# Patient Record
Sex: Male | Born: 1940 | Race: White | Hispanic: No | Marital: Married | Smoking: Former smoker
Health system: Southern US, Community
[De-identification: ages and names within clinical notes are randomized; demographics above are authoritative.]

## PROBLEM LIST (undated history)

## (undated) DIAGNOSIS — D696 Thrombocytopenia, unspecified: Secondary | ICD-10-CM

## (undated) DIAGNOSIS — I209 Angina pectoris, unspecified: Secondary | ICD-10-CM

## (undated) DIAGNOSIS — I639 Cerebral infarction, unspecified: Secondary | ICD-10-CM

## (undated) DIAGNOSIS — I482 Chronic atrial fibrillation, unspecified: Secondary | ICD-10-CM

## (undated) DIAGNOSIS — I10 Essential (primary) hypertension: Secondary | ICD-10-CM

## (undated) DIAGNOSIS — E119 Type 2 diabetes mellitus without complications: Secondary | ICD-10-CM

## (undated) DIAGNOSIS — E785 Hyperlipidemia, unspecified: Secondary | ICD-10-CM

## (undated) DIAGNOSIS — N529 Male erectile dysfunction, unspecified: Secondary | ICD-10-CM

## (undated) DIAGNOSIS — I499 Cardiac arrhythmia, unspecified: Secondary | ICD-10-CM

## (undated) DIAGNOSIS — Z87442 Personal history of urinary calculi: Secondary | ICD-10-CM

## (undated) DIAGNOSIS — I252 Old myocardial infarction: Secondary | ICD-10-CM

## (undated) DIAGNOSIS — M1990 Unspecified osteoarthritis, unspecified site: Secondary | ICD-10-CM

## (undated) DIAGNOSIS — N400 Enlarged prostate without lower urinary tract symptoms: Secondary | ICD-10-CM

## (undated) DIAGNOSIS — I2510 Atherosclerotic heart disease of native coronary artery without angina pectoris: Secondary | ICD-10-CM

## (undated) HISTORY — DX: Hyperlipidemia, unspecified: E78.5

## (undated) HISTORY — DX: Thrombocytopenia, unspecified: D69.6

## (undated) HISTORY — DX: Chronic atrial fibrillation, unspecified: I48.20

## (undated) HISTORY — DX: Atherosclerotic heart disease of native coronary artery without angina pectoris: I25.10

## (undated) HISTORY — DX: Male erectile dysfunction, unspecified: N52.9

## (undated) HISTORY — DX: Essential (primary) hypertension: I10

## (undated) HISTORY — DX: Cardiac arrhythmia, unspecified: I49.9

## (undated) HISTORY — DX: Type 2 diabetes mellitus without complications: E11.9

## (undated) HISTORY — DX: Old myocardial infarction: I25.2

## (undated) HISTORY — PX: APPENDECTOMY: SHX54

## (undated) HISTORY — DX: Benign prostatic hyperplasia without lower urinary tract symptoms: N40.0

---

## 1996-02-10 HISTORY — PX: HERNIA REPAIR: SHX51

## 1999-06-05 ENCOUNTER — Inpatient Hospital Stay (HOSPITAL_COMMUNITY): Admission: EM | Admit: 1999-06-05 | Discharge: 1999-06-06 | Payer: Self-pay | Admitting: Emergency Medicine

## 1999-06-05 ENCOUNTER — Encounter: Payer: Self-pay | Admitting: Emergency Medicine

## 1999-06-06 ENCOUNTER — Encounter: Payer: Self-pay | Admitting: Interventional Cardiology

## 2003-03-27 ENCOUNTER — Inpatient Hospital Stay (HOSPITAL_COMMUNITY): Admission: EM | Admit: 2003-03-27 | Discharge: 2003-03-30 | Payer: Self-pay | Admitting: Emergency Medicine

## 2003-12-27 ENCOUNTER — Ambulatory Visit: Payer: Self-pay | Admitting: Internal Medicine

## 2004-03-13 ENCOUNTER — Ambulatory Visit (HOSPITAL_COMMUNITY): Admission: RE | Admit: 2004-03-13 | Discharge: 2004-03-13 | Payer: Self-pay | Admitting: Internal Medicine

## 2004-03-27 ENCOUNTER — Ambulatory Visit: Payer: Self-pay | Admitting: Cardiology

## 2004-03-27 ENCOUNTER — Ambulatory Visit: Payer: Self-pay

## 2004-04-03 ENCOUNTER — Encounter: Admission: RE | Admit: 2004-04-03 | Discharge: 2004-04-03 | Payer: Self-pay | Admitting: Internal Medicine

## 2004-07-04 ENCOUNTER — Ambulatory Visit: Payer: Self-pay | Admitting: Cardiology

## 2004-07-10 ENCOUNTER — Ambulatory Visit: Payer: Self-pay | Admitting: Cardiology

## 2007-07-12 ENCOUNTER — Encounter (INDEPENDENT_AMBULATORY_CARE_PROVIDER_SITE_OTHER): Payer: Self-pay | Admitting: Gastroenterology

## 2007-07-12 ENCOUNTER — Ambulatory Visit (HOSPITAL_COMMUNITY): Admission: RE | Admit: 2007-07-12 | Discharge: 2007-07-12 | Payer: Self-pay | Admitting: Gastroenterology

## 2007-12-06 ENCOUNTER — Ambulatory Visit (HOSPITAL_COMMUNITY): Admission: RE | Admit: 2007-12-06 | Discharge: 2007-12-06 | Payer: Self-pay | Admitting: Interventional Cardiology

## 2010-02-09 DIAGNOSIS — Z87442 Personal history of urinary calculi: Secondary | ICD-10-CM

## 2010-02-09 HISTORY — DX: Personal history of urinary calculi: Z87.442

## 2010-06-24 NOTE — Cardiovascular Report (Signed)
Eric Gallagher, ZEIMET NO.:  [id number]   MEDICAL RECORD NO.:  [id number]          PATIENT TYPE:  OIB   LOCATION:  2899                         FACILITY:  [facility]   PHYSICIAN:  Lyn Records, M.D.   DATE OF BIRTH:  [birth year]   DATE OF PROCEDURE:  12/06/2007  DATE OF DISCHARGE:                            CARDIAC CATHETERIZATION   INDICATIONS:  1. History of coronary artery disease.  2. Exertional chest tightness.  3. The patient has chronic atrial fibrillation with rate control.   PROCEDURE PERFORMED:  1. Left heart cath.  2. Selective coronary angio.  3. Left ventriculography.   DESCRIPTION:  After informed consent, a 6-French sheath was inserted  into the right femoral artery using the modified Seldinger technique.  A  6-French A2 multipurpose catheter was then used for hemodynamic  recordings and left ventriculography by hand injection.  Because of  aortic anatomy, we were unable to selectively engage the coronaries with  multipurpose catheter.  We therefore switched out to preformed Judkins-  style catheters.  A 6-French #4 left and a 6-French #4 right Judkins  catheter were used for left and right coronary angiography respectively.  The patient tolerated the procedure without complications.   RESULTS:  1. Hemodynamic data:      a.     Aortic pressure 122/73.      b.     Left ventricular pressure 130/9.  2. Left ventriculography:  No mitral regurgitation is noted.  Overall      LV function is normal.  EF is 50%.  3. Coronary angiography:      a.     Left main coronary:  Normal.      b.     Left anterior descending coronary:  The LAD contains       eccentric proximal narrowing.  The stent in the proximal vessel       was widely patent.  The narrowing is proximal to the stent.      c.     Circumflex artery:  Circumflex artery gives origin to a       large obtuse marginal and contains irregularities with proximal       30% eccentric narrowing.  The  vessel however is widely patent.      d.     Right coronary artery:  The right coronary artery is a large       dominant vessel giving two left ventricular branches in the PDA.       The vessel contains no significant obstruction.  There is ectasia       in the mid and proximal vessel.  There is ostial 30%-50% RCA       narrowing.  No catheter damping is noted.  Significant reflux of       contrast into the aortic root is noted.   CONCLUSION:  1. Mild to moderate coronary artery disease with 30% proximal LAD      eccentric stenosis, 40-50% ostial RCA stenosis, and 40% proximal      circumflex.  2. Widely patent proximal LAD stent.  3.  Normal LV function.  4. Exertional chest tightness etiology uncertain.  Consider myocardial      perfusion study, but with the wide patency of the coronary      arteries, I do not believe this is indicated at this point.      Consider making adjustments in the patient's medical regimen.      Lyn Records, M.D.  Electronically Signed     HWS/MEDQ  D:  12/06/2007  T:  12/06/2007  Job:  [id number]   cc:   Theressa Millard, M.D.  Danise Edge, M.D.  Georgann Housekeeper, MD

## 2010-06-24 NOTE — Op Note (Signed)
NAME:  Eric Gallagher, MARGRAF.:  [id number]   MEDICAL RECORD NO.:  [id number]          PATIENT TYPE:  AMB   LOCATION:  ENDO                         FACILITY:  [facility]   PHYSICIAN:  Danise Edge, M.D.   DATE OF BIRTH:  [birth year]   DATE OF PROCEDURE:  07/12/2007  DATE OF DISCHARGE:                               OPERATIVE REPORT   REFERRING PHYSICIAN:  Georgann Housekeeper, MD   PROCEDURE INDICATIONS:  Eric Gallagher is a 70 year old male born  1940-09-08.  Eric Gallagher is scheduled to undergo his first  screening colonoscopy with polypectomy to prevent colon cancer.   Eric Gallagher has chronic atrial fibrillation, but is not on Coumadin.   ENDOSCOPIST:  Danise Edge, MD   PREMEDICATION:  1. Fentanyl 50 mcg.  2. Versed 4 mg.   PROCEDURE:  After obtaining informed consent, Eric Gallagher was placed in  the left lateral decubitus position.  I administered intravenous  fentanyl and intravenous Versed to achieve conscious sedation for the  procedure.  The patient's blood pressure, oxygen saturation, and cardiac  rhythm were monitored throughout the procedure and documented in the  medical record.   Anal inspection and digital rectal exam were normal.  The prostate was  nonnodular.  The Pentax pediatric colonoscope was introduced into the  rectum and easily advanced to the cecum.  A normal-appearing appendiceal  orifice and ileocecal valve were identified.  Colonic preparation for  the exam today was good.   Rectum:  From the distal rectum a 3-mm sessile polyp was removed with  the cold snare.  The specimen was not retrieved for pathological  evaluation.  Sigmoid colon and descending colon:  A few small diverticula are noted  in the sigmoid colon.  Splenic flexure normal.  Transverse colon normal.  Hepatic flexure normal.  Ascending colon:  From the distal ascending colon, a 3-mm sessile polyp  was removed with the cold snare and submitted for pathological  interpretation.  Cecum and ileocecal valve normal.   ASSESSMENT:  1. A small polyp was removed from the distal ascending colon and      submitted for pathological interpretation.  2. A small polyp was removed from the distal rectum.  Pathology was      not sent.  3. Sigmoid colonic diverticulosis.   RECOMMENDATIONS:  Repeat colonoscopy in 5 years.           ______________________________  Danise Edge, M.D.     MJ/MEDQ  D:  07/12/2007  T:  07/12/2007  Job:  [id number]   cc:   Georgann Housekeeper, MD

## 2010-06-27 NOTE — Cardiovascular Report (Signed)
[place]  Patient:    Eric Gallagher, Eric Gallagher                       MRN: [id number] Proc. Date: 06/05/99 Adm. Date:  [id number] Disc. Date: [id number] Attending:  Lyn Records. Iii                        Cardiac Catheterization  REASON FOR PROCEDURE:  Unrelenting chest discomfort consistent with ischemia but with normal ECG.  CINE NUMBER:  [id number]  PROCEDURES PERFORMED: 1. Left heart catheterization. 2. Selective coronary angiography. 3. Left ventriculography. 4. Right arterial sheath angiogram.  DESCRIPTION OF PROCEDURE:  After informed consent, a 6 French sheath was inserted into the right femoral artery using a modified Seldinger technique. A 6 French A2 multipurpose catheter was used for hemodynamic recordings, left ventriculography and selective right coronary angiography.  A #4 left Judkins catheter was used for left coronary angiography.  A sheath ______ was performed in the right femoral.  Perclose was not performed because of a branch entry site.  No complications occurred during the procedure.  During the procedure, the patient states that he is having no discomfort.  RESULTS:  I:  LEFT VENTRICULOGRAPHY:  The left ventricle demonstrates normal contractility.  Ejection fraction is 65-70%.  No mitral regurgitation is noted.  II:  SELECTIVE CORONARY ANGIOGRAPHY:    a. Right coronary:  The right coronary artery is a large dominant       vessel giving origin to two large left ventricular branches and a       large PDA.  There was an ostial 30-50% lesion.  Irregularity is       noted in the mid vessel.  No significant right coronary disease is       noted.    b. Left main coronary:  The left main coronary contains a proximal       kink that obstructs the artery by up 25%.  No significant obstruction       was noted.    c. Left anterior descending coronary:  The artery wraps around the       left ventricular apex.  It gives origin to two  small diagonal branches.       There is an eccentric 50% proximal stenosis.  No high-grade obstruction       is noted in the LAD.    d. Circumflex artery:  The circumflex artery is large, terminating in one       dominant obtuse marginal that trifurcates on the left lateral wall.  CONCLUSIONS: 1. No explanation for the patients chest discomfort based upon coronary    anatomy.  There is mild right coronary, LAD, and left main disease. 2. Normal left ventricular function. 3. Etiology of chest discomfort is uncertain, but we must exclude    pericarditis, aortic dissection, gallbladder disease, and pulmonary    parenchymal infectious process.  PLAN:  We will review chest x-rays.  Consider chest CT scan.  Consider gallbladder ultrasound.  A 2-D echocardiogram will be done to evaluate pericardium and aorta. DD:  06/06/99 TD:  06/06/99 Job: [id number]

## 2010-06-27 NOTE — Discharge Summary (Signed)
NAME:  Eric Gallagher, Eric Gallagher NO.:  [id number]   MEDICAL RECORD NO.:  [id number]                   PATIENT TYPE:  INP   LOCATION:  4702                                 FACILITY:  [facility]   PHYSICIAN:  Lyn Records, M.D.                DATE OF BIRTH:  March 03, 1940   DATE OF ADMISSION:  03/27/2003  DATE OF DISCHARGE:  03/30/2003                                 DISCHARGE SUMMARY   ADMISSION DIAGNOSES:  1. Chest pain, probable non-Q-wave myocardial infarction.  2. Hypertension.  3. Non-insulin-dependent diabetic.  4. Unknown lipid status.   DISCHARGE DIAGNOSES:  1. Coronary artery disease.     A. Status post non-Q-wave anterior myocardial infarction.     B. Status post stent to the left anterior descending artery, March 27, 2003.  2. Hypertension, controlled.  3. Hyperlipidemia.  4. Non-insulin-dependent diabetes.   PROCEDURES:  Left heart catheterization, March 27, 2003.   COMPLICATIONS:  None.   DISCHARGE STATUS:  Stable, improved.   HPI:  Please see complete H&P for details, but, in short, this is a 70-year-  old male who was brought to the emergency room on March 27, 2003, with  complaints of midsternal chest tightness and shortness of breath,  diaphoresis, and feelings of presyncope.  This began after he moved an  approximately 50 pound aluminum door.  He came in and sat down, had 10/10  discomfort, and tried Tums and Rolaids without relief.  EMS was called.  He  was given one sublingual nitroglycerin en route, with some decrease in his  discomfort.  In the emergency room, he was given an additional  nitroglycerin, again with some relief in his pain.  However, he still  continued to complain of a 2/10 chest pain.   PHYSICAL EXAM ON ADMISSION:  VITAL SIGNS:  Blood pressure 142/95, heart rate  67, respirations 20, temp 98.8, O2 sats 98% on 2 L.  GENERAL:  Conscious, alert, well-oriented.  SKIN:  Warm and dry.  NECK:  No JVD,  thyromegaly or cervical lymphadenopathy.  No subclavian or  carotid bruit.  LUNGS:  Clear to auscultation bilaterally.  HEART:  Regular rate and rhythm.  ABDOMEN:  Obese, but soft and nontender.  Normal bowel sounds.  No  hepatosplenomegaly.  EXTREMITIES:  No peripheral edema.  Pulses +2 bilaterally in all  extremities.  NEUROLOGIC:  No deficits.   ADMISSION LABS:  Chest x-ray showed no acute process.  EKG showed a normal  sinus rhythm without ischemic changes.  Normal CBC with the exception of  thrombocytopenia at 75.  CMP was normal with the exception of hyperglycemia  at 183.  Initial cardiac markers were elevated, with myoglobin of 271, CK-MB  5.8, troponin 0.20, rising to 0.62.   HOSPITAL COURSE:  The patient was admitted for further cardiac evaluation,  serial cardiac enzymes.  He was started  on IV heparin and IV nitroglycerin,  and aspirin p.o.  He was also started on low-dose beta blocker for cardiac  protection as well as hypertension.  Glucophage was placed on hold for  probable cath.  Sliding scale coverage was added.  A fasting lipid panel was  planned to be checked.   An hour or so later, he was taken to the cardiac cath lab by Dr. Corliss Marcus in the absence of Dr. Katrinka Blazing.  Left heart cath was performed without  incident.  Results showed normal left main and left circumflex.  RCA had an  area of 40% mid, with a thrombus present.  The LAD had a proximal 95%  lesion, which was successfully stented with a Taxus stent.   Serial cardiac enzymes peaked with a total CK at 321, with 55.3 MB and a  troponin of 4.88.  Hemoglobin A1c was also obtained, which revealed 9.8.   Essentially, for the rest of his hospital admission, his vital signs  remained stable, and he had no recurrent chest discomfort or shortness of  breath.  He continued to exhibit low platelet count.  On March 28, 2003,  it was 76,000.  His Aggrastat was discontinued at this point.  Cardiac rehab  was  initiated.  Of note, the patient is now also a participant in the [facility]  protocol with [employer] research, with either Plavix or prasugrel, which is  randomized.   He was up and ambulating without difficulty with cardiac rehab.  He was  essentially walking independently.  No further complaints of chest pain.  Lipid profile returned showing a total of 213, triglycerides 271, HDL 36,  LDL 123.  He was started on Lipitor at this point.   On March 30, 2003, the patient was feeling well.  Vital signs were  stable.  He was discharged home without further incident.   DISCHARGE MEDICATIONS:  1. Lipitor 20 mg daily.  2. Lopressor 50 mg one half tablet daily.  3. Glucophage 1000 mg b.i.d.  4. Aspirin 325 mg daily.  5. Nitroglycerin 0.4 mg p.r.n.   He was instructed not to perform any strenuous activity until he sees Dr.  Katrinka Blazing for follow-up.  He is to maintain a low-salt, low-fat, low-cholesterol  diet, as well as maintain his diabetic diet restrictions.   He will be contacted by a research nurse from [facility] for a  follow-up appointment.  He has an appointment to see Dr. Katrinka Blazing for follow-up  on Monday, April 16, 2003, at 3:15 p.m.  He is to come at 2:15 p.m. that same  day for a repeat CBC to re-evaluate thrombocytopenia, and he may eventually  need hematology consult.  Of note, the patient does not have a primary care  physician, and he has been instructed to obtain one as soon as possible.     Adrian Saran, N.P.                        Lyn Records, M.D.   HB/MEDQ  D:  03/30/2003  T:  03/31/2003  Job:  [id number]

## 2010-06-27 NOTE — Discharge Summary (Signed)
[place]. [facility]  Patient:    Eric Gallagher, Eric Gallagher                       MRN: [id number] Adm. Date:  [id number] Disc. Date: [id number] Attending:  Lyn Records. Iii Dictator:   Anselm Lis, N.P.                           Discharge Summary  DATE OF BIRTH:  1940/10/28  PRIMARY CARE Cedarius Kersh:  Unestablished.  PROCEDURES: 1. (June 05, 1999):  Cardiac catheterization revealing mild right coronary    artery disease with 30 to 50% proximal right coronary artery, proximal left    main kink that obstructs the artery by up to 25%, 50% proximal left    anterior descending, and no significant circumflex disease.  Ejection    fraction 65 to 70% without MR, normal contractually.  Two-dimensional    echocardiogram (June 06, 1999) revealing borderline left ventricular    hypertrophy with normal left ventricular function.  Mildly dilated left    atrium; lipomatous hypertrophy of the intra-atrial septum noted.  Left    atrial dimension of 4.51 cm.  Normal right ventricle.  Trace mitral    regurgitation, trace tricuspid regurgitation. No pulmonary hypertension. 2. Chest CT:  No pulmonary emboli; no aortic dissection.  Mediastinal    adenopathy.  Abdominal CT:  Negative gallstones, bilaterally renal cysts.  DISCHARGE DIAGNOSES: 1. Chest pain of uncertain etiology.    a. Cardiac catheterization June 05, 1999, which is negative for       significant coronary artery disease.  Normal left ventricular       function.    b. Two-dimensional echocardiogram negative for pericardial effusion which       ruled out pericarditis.    c. Chest and abdominal CT were negative for aortic dissection nor for       obvious pulmonary etiology.  There was evidence of bilateral renal       cysts; urinalysis positive for Escherichia coli.  Initiate on Bactrim       for question of infected renal cyst.  No other clear etiology for chest       pain elicited. 2. Thrombocytopenia of  uncertain etiology; admission platelets of 95,000;    follow-up platelet count 81,000.  PLAN:  Patient discharged home in stable condition.  DISCHARGE MEDICATIONS:  Bactrim DS one tablet p.o. b.i.d.  ACTIVITY:  As tolerated.  DIET:  Regular.  FOLLOW-UP:  Dr. Verdis Prime; patient to call for next available appointment.  HOSPITAL COURSE:  (Please also see dictated H&P).  Eric Gallagher is a pleasant 70 year old male admitted with anterior chest pain and EKG abnormalities suspicious for "old" posterior infarct.  In the emergency room, he was treated with sublingual/IV nitrates, as well as baby aspirin and initiation of subcu Lovenox with improvement of discomfort though continued low grade.  Initial hypotension from sublingual nitrates with systolic pressure down to the 21H (from 160s) treated with IV fluids.  Patient was taken urgently to catheterization lab with the following results:  1. Left ventriculogram:  Normal. 2. Coronary angiography:    a. Left main:  Approximately 20%.    b. Left anterior descending:  30%.    c. Circumflex:  Normal.    d. Right coronary artery:  30 and 50% ostial.  Etiology of chest pain clearly not related to coronary artery disease.  He  subsequently underwent a 2-D echocardiogram which was negative for evidence of pericardial effusion reflective of pericarditis.  Abdominal and chest CT revealed negative aortic aneurysm nor obvious pulmonary disease.  He did have evidence of bilateral renal cysts.  His UA culture was positive for E. coli. Initial WBC of 17.3; 9.0 at the time of discharge.  He was initiated on Bactrim for treatment of possible infected renal cysts versus UTI versus prostatitis.  He is discharged home in stable condition.  LABORATORY TESTS AND DATA:  EKG revealed NSR with early R wave progression; question "old" posterior MI.  Urine culture was positive for E. coli greater than 100,000 colonies. Admission coagulations were within normal  range.  Admission chemistry profile reveals a sodium of 136, K 3.9, chloride 104, CO2 23, glucose elevated at 203, 134 at time of discharge.  BUN 15, creatinine 0.7, calcium 9.0.  LFTs within normal range though albumin decreased at 3.1.  First CK of 149, with MB fraction of 2.1 and troponin I of less than 0.03.  Admission WBC 17.6; 9.0 at time of discharge.  Hemoglobin 14.6 with hematocrit of 43.0.  Admission platelets 95, 81 at time of discharge.  CBC differential with left shift; neutrophils elevated at 92%.  Admission chest x-ray revealed low inspiratory lung volumes and mild bibasilar atelectasis, otherwise negative. DD:  08/26/99 TD:  08/26/99 Job: [id number]

## 2010-06-27 NOTE — H&P (Signed)
[place]. [facility]  Patient:    Eric Gallagher, Eric Gallagher                      MRN: [id number] Adm. Date:  06/05/99 Attending:  Darci Needle, M.D. Dictator:   Anselm Lis, N.P.                         History and Physical  PRECARDIAC CATHETERIZATION  SUBJECTIVE:  Eric Gallagher is a pleasant 70 year old without prior history of CAD, positive family history for CAD, a remote history of hypertension, remote tobacco use, who, approximately one year earlier developed exertional symptoms of anterior chest pressure with associated arm discomfort relieved with rest. The discomfort has been increasing in frequency and intensity over time. About a 1-1/2 weeks ago he had an episode while driving his car of associated presyncopal symptoms as well as palpitations.  The symptoms lasted approximately 10 minutes.  Yesterday afternoon about 1:30 he had onset of anterior chest pressure which has been consistent since.  The discomfort has increased in intensity and severity.  He has associated palpitations, shortness of breath, felt hot, and had bilateral arm discomfort.  He was taken to [facility] this morning; appeared gray by ER nurse assessment.  EKG revealed "old," posterior infarct with early R-wave progression.  He was given sublingual nitrites with decreased systolic blood pressure from 160s down to 80s.  He was placed on IV nitrites as well as given subcutaneous lovenox and four baby aspirin.  He has had improvement in his discomfort, though it does continue low-grade anterior pressure.  CARDIAC RISK FACTORS:  Age, male sex, positive family history of CAD, remote tobacco and remote hypertension.  PREVIOUS MEDICAL HISTORY: 1. Gastroesophageal reflux disease. 2. Remote hypertension. 3. History of appendectomy, left hernia repair with mesh, vasectomy. 4. Patient denies history of peptic ulcer disease, thyroid disease, asthma,    cancer.  He has unknown  cholesterol profile.  SOCIAL HISTORY AND HABITS:  Tobacco use:  Negative.  Quit smoking about 30 years earlier prior to this smoked 2-1/2 packs a day for 10 years. ETOH:  Negative.  Caffeine:  A few cups of coffee in the morning and two liters of soda a day.  Patients first wife died after marriage of about 40 years.  She died of complications related to cancer.  His second wife to which he is married about 2-1/2 years.  Patient has four children from first marriage; identical twins, sons age 61, an old son age 39, and a daughter age 7, all alive and well.  He works on [employer].  He is a new resident to [place], about the last two years prior to this lived about 200 miles [place].  He is not established with primary care in this area.  FAMILY HISTORY:  Father deceased age 76 of cancer, uncertain type; suffered prior abdominal aortic aneurysm as well as neck aneurysm repair.  His mother died of a stroke age 18, sudden in onset.  Had been healthy prior to this. The patient has five brothers and four sisters.  One sibling died of liver cirrhosis.  One brother died of myocardial infarction, age 75.  Another brother has had a heart attack, age early 17s.  REVIEW OF SYSTEMS:  As in HPI/PAST MEDICAL HISTORY: Otherwise complains of palpitations occurring approximately 2 times per week without associated symptoms.  Presyncopal episode as in  HPI, though no other episodes of this.  Denies dysphagia to fruit or fluid.  Does wear glasses.  Negative melena.  No overt or blood PR.  Negative constipation nor diarrhea.  He does have a long history of GERD symptoms felt as epigastric burning and sour taste in his mouth.  Good relief with p.r.n. TUMS.  Denies pedal edema, orthopnea, nor PND.  Denies dysuria nor hematuria; does have problems with urinary hesitancy.  PHYSICAL EXAMINATION:  VITAL SIGNS:  Blood pressure initial systolic of 160; down to 80s post sublingual  nitrites.  He was placed on IV fluids 125 mL/h.  Maintain pressure on IV nitrites but now blood pressure hovering around high 80s for which IV fluids were increased to 250 ml/h.  His heart rate is 90s and regular. Respiratory rate 20.  He is afebrile.  GENERAL:  In general, he is a well-nourished 70 year old male in no acute distress and accompanied by his wife and other.  HEENT:  Brisk bilateral carotid upstrokes without bruit.  NECK:  No significant JVD nor thyromegaly.  CHEST:  Bibasilar crackles.  CARDIAC:  Regular rate and rhythm without murmur, rub, nor gallop.  Normal S1 and S2.  ABDOMEN:  Soft, protrudent, normal active bowel sounds.  Negative abdominal aorta.  Revealed no femoral bruit.  Nontender to palpation.  EXTREMITIES:  Bilateral radial +2/4, femoral, dorsalis pedis, and posterior tibial pulses.  Negative pedal edema.  NEUROLOGIC:  Cranial nerves II-XII grossly intact; alert and oriented x 3.  GENITALIA/RECTAL:  Exam deferred.  LABORATORY TESTS AND DATA:  Sodium 136, potassium 3.9, chloride 104, CO2 23, BUN 15, creatinine 0.7, and glucose of 203.  Chemistry profile:  Hemoglobin 14.6, with WBC of 17.3, and platelets decreased at 95.  Pro time of 14.1 with INR of 1.2 and PTT of 28.  EKG reveals NSR with early R-wave progression; ? "old," posterior MI.  IMPRESSION: 1. Acute coronary syndrome; electrocardiogram changes suspicious of    possible recent posterior myocardial infarction.  No acute ST-segment    changes.  The first set of cardiac enzymes are negative.  Currently, he    has mild anterior chest discomfort which is improved status post    nitrites, subcutaneous Lovenox, and baby aspirin.  He can continue his IV    nitroglycerin drip. 2. Thrombocytopenia with platelets decreased at 95, of uncertain etiology. 3. Leukocytosis, probable stress response to acute coronary syndrome.  PLAN: 1. Urgently to cardiac catheterization laboratory for coronary  angiography    with possible percutaneous intervention if indicated and able.  Risks,     potential complications, benefits and alternatives of the procedure is    discussed with the patient.  Eric Gallagher indicates his questions and    concerns have been addressed and is agreeable for this to be received. 2. Will treat hypotension with bolus of IV fluids. 3. Will not give upstream IIb/IIIa inhibitors prior to catheterization    in the setting of thrombocytopenia. DD:  06/05/99 TD:  06/05/99 Job: [id number]

## 2010-06-27 NOTE — Cardiovascular Report (Signed)
NAME:  Eric Gallagher, Eric Gallagher                          ACCOUNT NO.:  [id number]   MEDICAL RECORD NO.:  [id number]                   PATIENT TYPE:  INP   LOCATION:  2115                                 FACILITY:  [facility]   PHYSICIAN:  Francisca December, M.D.               DATE OF BIRTH:  25-Sep-1940   DATE OF PROCEDURE:  03/27/2003  DATE OF DISCHARGE:                              CARDIAC CATHETERIZATION   PROCEDURES PERFORMED:  1. Left heart catheterization.  2. Coronary angiography.  3. Left ventriculogram.  4. Percutaneous coronary intervention/stent implantation proximal left     anterior descending.  5. Right femoral arteriogram.  6. Percutaneous closure (Angio-Seal), right femoral artery.  7. Intracoronary thrombectomy.   CARDIOLOGIST:  Francisca December, M.D.   INDICATIONS:  Eric Gallagher is a 70 year old diabetic man who [facility]  [facility] around noon today complaining of anterior substernal  severe chest pain with radiation to the arms.  There was associated nausea  and diaphoresis.  Initial cardiac enzymes were negative, but then  subsequently began to increase.  These were point of care enzymes including  myoglobin and troponin.  There was no diagnostic ST elevation.  Because of  residual chest discomfort and increasing cardiac enzymes the patient was  brought to the catheterization laboratory to define the extent of disease  and provide for further therapeutic options.   PROCEDURAL NOTE:  The patient was brought to the cardiac catheterization  laboratory in an urgent fashion.  The right groin was prepped and draped in  the usual sterile fashion.  Local anesthesia was obtained with the  infiltration of 1% lidocaine.  A 6 French catheter sheath was inserted  percutaneously into the right femoral artery utilizing an anterior approach  over a guiding J-wire.  A 110 cm pigtail catheter was used to measure  pressures in the ascending aorta and in the left ventricle both  prior to and  following the ventriculogram.  A 30-degree RAO cine left ventriculogram was  performed utilizing a power injector.  Using 6 [place] #4 left and right  Judkins catheters cine angiography of each coronary was then performed in  multiple RAO and LAO projections.  We then proceeded with coronary  intervention.   The 6 French catheter sheath was exchanged over a long guiding J-wire for a  7 French catheter sheath.  The patient received 5500 unit of heparin  intravenously.  He also received a bolus of Aggrastat at 25 mcg/kg and a  subsequent constant infusion.  Initial ACT was 305 seconds.  A 4.0 CLS left  guiding catheter was advanced to the ascending aorta where the left coronary  os was engaged.  A 0.014 inch Sci-Med Luge intracoronary guidewire was  passed across the lesion in the proximal anterior descending artery without  difficulty.  This was a highly thrombotic lesion.  An initial coronary  thrombectomy was performed using an Export  catheter.  Three passes were  obtained and two syringes withdrawn with partial success at removing the  thrombus load.  The thrombectomy catheter was then removed.  It was replaced  with a 3.5/24 mm Sci-Med TAXUS intracoronary drug-eluting stent.  The stent  was carefully positioned and subsequently deployed at a peak pressure of 14  atmospheres; two inflations performed for approximately 45 seconds each.  The stent balloon was then removed.   Cine angiography was performed in orthogonal views confirming adequate  patency.  The patient did receive a 1.2 mg intracoronary injection of  adenosine.  TIMI Grade III flow was present at completion.   The guiding catheter and guidewire were then removed.  A 45-degree RAO right  femoral arteriogram was performed using hand injection of contrast.  The  arteriogram confirmed the femoral artery to be widely patent without  significant atherosclerotic plaquing and the arteriotomy site was above the   bifurcation into the profunda femoris and superficial femoral arteries.  I  then proceeded with percutaneous closure of the arteriotomy site utilizing  the Angio-Seal device with good success.  Distal pulses were intact.   The patient was then removed from catheterization table an transported to  the recovery area in stable condition.   HEMODYNAMIC DATA:  Systemic arterial pressure was 127/85 with a mean of 104  mmHg.  There was no systolic gradient across the aortic valve.  The left  ventricular end diastolic pressure was 18 mmHg pre- ventriculogram.   ANGIOGRAPHIC DATA:  Left Ventriculogram:  The left ventriculogram  demonstrated normal chamber size and lower limits of normal global systolic  function.  There was anterolateral and apical akinesis.  The visual estimate  of the ejection fraction was 50%.  There was no mitral regurgitation.  There  was left coronary calcification seen.  The aortic valve was normal in  morphology, trileaflet and opened adequately.   There was a right dominant coronary system present.   Main Left Coronary Artery:  The main left coronary artery was normal.   Left Anterior Descending Artery:  The left anterior descending artery and  its branches were highly diseased.  There was an aneurysmal very proximal  segment and then a very small first diagonal branch arises.  Just after this  very small diagonal branch there was a 99% stenosis with clearly apparent  thrombus forming a meniscus both proximally and distally.  TIMI Grade flow  was II.  The lesion extended to beyond the first septal perforator, was  diffuse in nature and somewhat angulated.  The ongoing anterior descending  artery was without significant obstruction.  Three small diagonal branches  arise, only the first of which has significant obstruction in the proximal  segment of about 80%.  It was a very small vessel though.   Left Circumflex Coronary Artery:  The left circumflex coronary artery  and its branches showed no significant obstruction.  A very small ramus  intermedius vessel was present.  The aneurysmal segment at the distal end of  the left main does extend to the proximal circumflex a short distance.  The  circumflex really amounts to a single large marginal branch on the  posterolateral wall of the heart.  There is no significant obstruction in  the proximal, mid or distal portion of the circumflex.  After the marginal  branch the ongoing circumflex is very small and remains in the AV groove.  Again, no obstruction seen also in the large obtuse marginal branch.   Right  Coronary Artery:  The right coronary artery and its branches was  minimally diseased,  there are luminal irregularities and there is a 30%  stenosis in the midportion.  The distal portion again has luminal  irregularities.  It gives rise to an acute marginal branch that does provide  distal septal perforators.  There is a small-to-moderate sized PDA and a  moderate-sized posterolateral segment that gives rise to a single moderate-  sized left ventricular branch.  In the distal right coronary and its  branches there are no significant obstructions.   Collateral vessels are not seen.   Following intracoronary thrombectomy balloon dilatation and stent  implantation in the proximal anterior descending artery there was no  residual stenosis.  There was a 90% stenosis at the origin of a very small  second diagonal branch that was placed in stent jail.  The anterior  descending artery itself though was widely patent throughout this proximal  segment.   IMPRESSION:  1. Atherosclerotic coronary vascular disease, single-vessel.  2. Status post anterior wall myocardial infarction,  3. Status post successful percutaneous transluminal coronary angioplasty,     intracoronary thrombectomy and drug-eluting stent implantation, proximal     anterior descending artery.  4. Intact global left ventricular size and  systolic function with regional     wall motion abnormality as noted.                                               Francisca December, M.D.    JHE/MEDQ  D:  03/27/2003  T:  03/28/2003  Job:  [id number]

## 2010-11-10 LAB — GLUCOSE, CAPILLARY: Glucose-Capillary: 124 — ABNORMAL HIGH

## 2014-02-09 HISTORY — PX: EYE SURGERY: SHX253

## 2014-02-15 ENCOUNTER — Encounter: Payer: Self-pay | Admitting: *Deleted

## 2014-05-29 ENCOUNTER — Encounter: Payer: Self-pay | Admitting: Interventional Cardiology

## 2014-05-29 ENCOUNTER — Ambulatory Visit (INDEPENDENT_AMBULATORY_CARE_PROVIDER_SITE_OTHER): Payer: Medicare Other | Admitting: Interventional Cardiology

## 2014-05-29 VITALS — BP 142/68 | HR 53 | Ht 75.0 in | Wt 230.8 lb

## 2014-05-29 DIAGNOSIS — N5201 Erectile dysfunction due to arterial insufficiency: Secondary | ICD-10-CM | POA: Insufficient documentation

## 2014-05-29 DIAGNOSIS — R0609 Other forms of dyspnea: Secondary | ICD-10-CM | POA: Insufficient documentation

## 2014-05-29 DIAGNOSIS — I482 Chronic atrial fibrillation, unspecified: Secondary | ICD-10-CM | POA: Insufficient documentation

## 2014-05-29 DIAGNOSIS — I2510 Atherosclerotic heart disease of native coronary artery without angina pectoris: Secondary | ICD-10-CM | POA: Insufficient documentation

## 2014-05-29 DIAGNOSIS — I25119 Atherosclerotic heart disease of native coronary artery with unspecified angina pectoris: Secondary | ICD-10-CM | POA: Insufficient documentation

## 2014-05-29 DIAGNOSIS — E119 Type 2 diabetes mellitus without complications: Secondary | ICD-10-CM | POA: Insufficient documentation

## 2014-05-29 DIAGNOSIS — E1159 Type 2 diabetes mellitus with other circulatory complications: Secondary | ICD-10-CM

## 2014-05-29 DIAGNOSIS — I25118 Atherosclerotic heart disease of native coronary artery with other forms of angina pectoris: Secondary | ICD-10-CM

## 2014-05-29 DIAGNOSIS — I4891 Unspecified atrial fibrillation: Secondary | ICD-10-CM

## 2014-05-29 DIAGNOSIS — I4821 Permanent atrial fibrillation: Secondary | ICD-10-CM | POA: Insufficient documentation

## 2014-05-29 LAB — BRAIN NATRIURETIC PEPTIDE: Pro B Natriuretic peptide (BNP): 265 pg/mL — ABNORMAL HIGH (ref 0.0–100.0)

## 2014-05-29 NOTE — Progress Notes (Signed)
Cardiology Office Note   Date:  05/29/2014   ID:  Eric Gallagher, DOB [birth year], MRN [id number]  PCP:  Georgann Housekeeper, MD  Cardiologist:   Lesleigh Noe, MD   Chief Complaint  Patient presents with  . Shortness of Breath    Exertional shortness of breath,      History of Present Illness: Eric Gallagher is a 74 y.o. male who presents for a six-month history of dyspnea on exertion. He has a prior history of coronary artery disease with catheterization in 2008 demonstrating three-vessel CAD of moderate severity. He had a prior anterior myocardial infarction treated with stenting remotely.  Over the past 6 months the patient has been experiencing decreased energy, dyspnea on exertion, and mild tightness with activity. If he stops and rests, the discomfort and dyspnea improved rapidly. For the past 2 months he has noted that his exertional tolerance has progressively narrowed. He is referred by Dr. Eula Listen for evaluation of this complaint. He denies orthopnea. He has not noticed lower extremity swelling. He has had occasional sensation of lightheadedness and near syncope.    Past Medical History  Diagnosis Date  . Hyperlipidemia   . Arrhythmia     Chronic AFIB, no anti-coag patient desire and low platelet count  . Thrombocytopenia     chronic  . 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 Coronary artery disease     NSTEMI in 2005, Taxus DES LAD  . Hypertension   . BPH (benign prostatic hyperplasia)   . Chronic a-fib   . Old MI (myocardial infarction)   . ED (erectile dysfunction)     History reviewed. No pertinent past surgical history.   Current Outpatient Prescriptions  Medication Sig Dispense Refill  . alfuzosin (UROXATRAL) 10 MG 24 hr tablet Take 10 mg by mouth daily with breakfast.    . amLODipine (NORVASC) 5 MG tablet Take 5 mg by mouth daily.    Marland Kitchen aspirin 325 MG tablet Take 325 mg by mouth daily.    . benazepril (LOTENSIN) 20 MG  tablet Take 20 mg by mouth daily.    Marland Kitchen glipiZIDE (GLUCOTROL) 5 MG tablet Take 2.5 mg by mouth daily before breakfast. Patient taking one half of a 5 mg tablet (2.5mg ) daily.    . metFORMIN (GLUCOPHAGE) 1000 MG tablet Take 1,000 mg by mouth 2 (two) times daily with a meal.    . metoprolol (LOPRESSOR) 100 MG tablet Take 100 mg by mouth 2 (two) times daily.    . Multiple Vitamins-Minerals (MULTIVITAL PO) Take 1 tablet by mouth daily.    . nitroGLYCERIN (NITROSTAT) 0.4 MG SL tablet Place 0.4 mg under the tongue every 5 (five) minutes as needed for chest pain.    . simvastatin (ZOCOR) 40 MG tablet Take 40 mg by mouth every evening.     No current facility-administered medications for this visit.    Allergies:   Flomax    Social History:  The patient  reports that he has quit smoking. He does not have any smokeless tobacco history on file. He reports that he does not drink alcohol or use illicit drugs.   Family History:  The patient's family history includes Aneurysm in his father; CVA in his mother; Heart attack in his maternal aunt; Heart disease in his brother, brother, and maternal aunt.    ROS:  Please see the history of present illness.   Otherwise, review of systems are positive for decreased hearing,  vision disturbance, muscle and back pain, dizziness, difficulty with balance..   All other systems are reviewed and negative.    PHYSICAL EXAM: VS:  BP 142/68 mmHg  Pulse 53  Ht 6\' 3"  (1.905 m)  Wt 230 lb 12.8 oz (104.69 kg)  BMI 28.85 kg/m2 , BMI Body mass index is 28.85 kg/(m^2). GEN: Well nourished, well developed, in no acute distress HEENT: normal Neck: no JVD, carotid bruits, or masses Cardiac: IIRR; no murmurs, rubs, or gallops,no edema  Respiratory:  clear to auscultation bilaterally, normal work of breathing GI: soft, nontender, nondistended, + BS MS: no deformity or atrophy Skin: warm and dry, no rash Neuro:  Strength and sensation are intact Psych: euthymic mood, full  affect   EKG:  EKG is ordered today. The ekg ordered today demonstrates atrial fibrillation with slow ventricular response   Recent Labs: No results found for requested labs within last 365 days.    Lipid Panel No results found for: CHOL, TRIG, HDL, CHOLHDL, VLDL, LDLCALC, LDLDIRECT    Wt Readings from Last 3 Encounters:  05/29/14 230 lb 12.8 oz (104.69 kg)      Other studies Reviewed: Additional studies/ records that were reviewed today include: [facility]. Review of the above records demonstrates: Echocardiogram in 2008 demonstrated normal LV size and function:;Holter monitor demonstrated persistent atrial fibrillation: Coronary angiography in 2009 demonstrated moderate three-vessel coronary disease with 30% eccentric LAD, 40-50% ostial RCA, 40% proximal circumflex, and patent proximal LAD stent.   ASSESSMENT AND PLAN:  DOE (dyspnea on exertion) - this complaint is concerning as it is significantly limiting the patient's exertional tolerance. Possible explanations include chronotropic incompetence, systolic or diastolic heart failure, pulmonary emboli, coronary disease with anginal equivalent. Symptoms of exertional  Coronary artery disease involving native coronary artery of native heart with other form of angina pectoris: Had moderate coronary disease in all 3 vessels 7 years ago  Erectile dysfunction due to arterial insufficiency  Type 2 diabetes mellitus with other circulatory complications. Last hemoglobin A1c 7.10 April 2014  Chronic atrial fibrillation: Relatively slow heart rate  Essential hypertension: Controlled     Current medicines are reviewed at length with the patient today.  The patient has concerns regarding medicines.  The following changes have been made:  Continue digoxin to allow faster heart rate. 2-D Doppler echocardiogram to assess LV systolic and diastolic function. BNP. If heart failure and/or chronotropic  incompetence are not present, he will need to have an ischemic evaluation, perhaps going straight to coronary angiography.  Labs/ tests ordered today include:   Orders Placed This Encounter  Procedures  . B Nat Peptide  . EKG 12-Lead  . 2D Echocardiogram without contrast     Disposition:   FU with Mendel RyderH. Murial Beam in PRN based on w/u above    Signed, Lesleigh NoeSMITH III,Jhane Lorio W, MD  05/29/2014 12:58 PM    [facility] [address], [place], [place]  [address] Phone: [phone]; Fax: [phone]

## 2014-05-29 NOTE — Patient Instructions (Addendum)
Medication Instructions:  STOP Digoxin  Labwork: Bnp Today  Testing/Procedures: Your physician has requested that you have an echocardiogram. Echocardiography is a painless test that uses sound waves to create images of your heart. It provides your doctor with information about the size and shape of your heart and how well your heart's chambers and valves are working. This procedure takes approximately one hour. There are no restrictions for this procedure.  Follow-Up: Your physician recommends that you schedule a follow-up appointment in: 6 weeks    Any Other Special Instructions Will Be Listed Below (If Applicable).

## 2014-05-30 ENCOUNTER — Ambulatory Visit (HOSPITAL_COMMUNITY): Payer: Medicare Other | Attending: Interventional Cardiology

## 2014-05-30 DIAGNOSIS — R0609 Other forms of dyspnea: Secondary | ICD-10-CM

## 2014-05-30 DIAGNOSIS — R0600 Dyspnea, unspecified: Secondary | ICD-10-CM

## 2014-05-30 DIAGNOSIS — I4891 Unspecified atrial fibrillation: Secondary | ICD-10-CM | POA: Insufficient documentation

## 2014-05-30 NOTE — Progress Notes (Signed)
2D Echo completed. 05/30/2014 

## 2014-05-31 ENCOUNTER — Telehealth: Payer: Self-pay

## 2014-05-31 NOTE — Telephone Encounter (Signed)
-----   Message from Lyn RecordsHenry W Smith, MD sent at 05/29/2014  5:26 PM EDT ----- Mild elevation in BNP. Shortness of breath may be related to mild fluid accumulation in the lungs. I will await the results of the echo before further recommendations

## 2014-05-31 NOTE — Telephone Encounter (Signed)
Pt aware of lab results with verbal understanding. Mild elevation in BNP. Shortness of breath may be related to mild fluid accumulation in the lungs.Dr.Smith will await the results of the echo before further recommendations

## 2014-06-06 ENCOUNTER — Telehealth: Payer: Self-pay | Admitting: Interventional Cardiology

## 2014-06-06 DIAGNOSIS — R0609 Other forms of dyspnea: Secondary | ICD-10-CM

## 2014-06-06 DIAGNOSIS — I482 Chronic atrial fibrillation, unspecified: Secondary | ICD-10-CM

## 2014-06-06 NOTE — Telephone Encounter (Signed)
New Message ° ° ° ° ° ° °Pt calling to get echo results. Please call back and advise. °

## 2014-06-06 NOTE — Telephone Encounter (Signed)
Returned pt call. Adv him that his echo results were not routed to Dr.Smit for review. I apologized. Adv him I will fwd a message to Dr.Smith ti review his echo and callback with his recommendation. Pt verbalized understanding.

## 2014-06-09 NOTE — Telephone Encounter (Signed)
Echo is abnormal with LVH, and RV enlargement.

## 2014-06-09 NOTE — Telephone Encounter (Signed)
Needs BMET, CT angio of lungs to r/o PE and pending lab results will need diuretic therapy and OV f/u

## 2014-06-11 NOTE — Telephone Encounter (Signed)
Pt and pt wife aware of echo results and Dr.Smith recommendations. Needs BMET, CT angio of lungs to r/o PE and pending lab results will need diuretic therapy and OV f/u Lab appt scheduled on 5/3. Adv pt and pt wife a scheduler from our office will call him to schedule his CT. Pt and pt wife verbalized understanding.

## 2014-06-12 ENCOUNTER — Other Ambulatory Visit: Payer: Medicare Other

## 2014-06-13 ENCOUNTER — Other Ambulatory Visit: Payer: Self-pay | Admitting: *Deleted

## 2014-06-13 DIAGNOSIS — I482 Chronic atrial fibrillation, unspecified: Secondary | ICD-10-CM

## 2014-06-14 ENCOUNTER — Other Ambulatory Visit (INDEPENDENT_AMBULATORY_CARE_PROVIDER_SITE_OTHER): Payer: Medicare Other | Admitting: *Deleted

## 2014-06-14 ENCOUNTER — Ambulatory Visit (INDEPENDENT_AMBULATORY_CARE_PROVIDER_SITE_OTHER)
Admission: RE | Admit: 2014-06-14 | Discharge: 2014-06-14 | Disposition: A | Discharge status: Home / Self Care | Payer: Medicare Other | Source: Ambulatory Visit | Attending: Interventional Cardiology | Admitting: Interventional Cardiology

## 2014-06-14 DIAGNOSIS — I482 Chronic atrial fibrillation, unspecified: Secondary | ICD-10-CM

## 2014-06-14 DIAGNOSIS — R0609 Other forms of dyspnea: Secondary | ICD-10-CM | POA: Diagnosis not present

## 2014-06-14 LAB — BASIC METABOLIC PANEL
BUN: 14 mg/dL (ref 6–23)
CO2: 28 mEq/L (ref 19–32)
CREATININE: 0.93 mg/dL (ref 0.40–1.50)
Calcium: 9.4 mg/dL (ref 8.4–10.5)
Chloride: 104 mEq/L (ref 96–112)
GFR: 84.13 mL/min (ref >60.00)
GLUCOSE: 126 mg/dL — AB (ref 70–99)
POTASSIUM: 4.5 meq/L (ref 3.5–5.1)
Sodium: 138 mEq/L (ref 135–145)

## 2014-06-14 MED ORDER — IOHEXOL 350 MG/ML SOLN
75.0000 mL | Freq: Once | INTRAVENOUS | Status: AC | PRN
  Administered 2014-06-14: 75 mL via INTRAVENOUS

## 2014-06-15 NOTE — Telephone Encounter (Signed)
Pt wife aware of CT results and Dr.Smith's recommendation. Let him know that there is no evidence of blood clots in the lungs. He needs to have a coronary angiogram so that we can exclude the possibility of progression of CAD Pt was not home. Left a message with pt wife. Dr.Smith and I will be in the office all day Mon. Pt is to call back with questions and concerns

## 2014-06-15 NOTE — Telephone Encounter (Signed)
-----   Message from Lyn RecordsHenry W Smith, MD sent at 06/15/2014  8:08 AM EDT ----- The catheterization should be a left and right heart cath with coronary angiogram

## 2014-06-18 ENCOUNTER — Encounter (HOSPITAL_COMMUNITY)
Admission: EM | Disposition: A | Discharge status: Home / Self Care | Payer: Self-pay | Source: Home / Self Care | Attending: Emergency Medicine

## 2014-06-18 ENCOUNTER — Encounter (HOSPITAL_COMMUNITY): Payer: Self-pay | Admitting: Emergency Medicine

## 2014-06-18 ENCOUNTER — Emergency Department (HOSPITAL_COMMUNITY): Payer: Medicare Other | Admitting: Anesthesiology

## 2014-06-18 ENCOUNTER — Emergency Department (HOSPITAL_COMMUNITY): Payer: Medicare Other

## 2014-06-18 ENCOUNTER — Ambulatory Visit (HOSPITAL_COMMUNITY)
Admission: EM | Admit: 2014-06-18 | Discharge: 2014-06-18 | Disposition: A | Discharge status: Home / Self Care | Payer: Medicare Other | Attending: Emergency Medicine | Admitting: Emergency Medicine

## 2014-06-18 DIAGNOSIS — Y9389 Activity, other specified: Secondary | ICD-10-CM | POA: Diagnosis not present

## 2014-06-18 DIAGNOSIS — W3189XA Contact with other specified machinery, initial encounter: Secondary | ICD-10-CM | POA: Diagnosis not present

## 2014-06-18 DIAGNOSIS — Z79899 Other long term (current) drug therapy: Secondary | ICD-10-CM | POA: Insufficient documentation

## 2014-06-18 DIAGNOSIS — S61012A Laceration without foreign body of left thumb without damage to nail, initial encounter: Secondary | ICD-10-CM | POA: Diagnosis not present

## 2014-06-18 DIAGNOSIS — I252 Old myocardial infarction: Secondary | ICD-10-CM | POA: Insufficient documentation

## 2014-06-18 DIAGNOSIS — Z7982 Long term (current) use of aspirin: Secondary | ICD-10-CM | POA: Insufficient documentation

## 2014-06-18 DIAGNOSIS — N400 Enlarged prostate without lower urinary tract symptoms: Secondary | ICD-10-CM | POA: Diagnosis not present

## 2014-06-18 DIAGNOSIS — Z87891 Personal history of nicotine dependence: Secondary | ICD-10-CM | POA: Insufficient documentation

## 2014-06-18 DIAGNOSIS — Z419 Encounter for procedure for purposes other than remedying health state, unspecified: Secondary | ICD-10-CM

## 2014-06-18 DIAGNOSIS — E785 Hyperlipidemia, unspecified: Secondary | ICD-10-CM | POA: Diagnosis not present

## 2014-06-18 DIAGNOSIS — I10 Essential (primary) hypertension: Secondary | ICD-10-CM | POA: Diagnosis not present

## 2014-06-18 DIAGNOSIS — I482 Chronic atrial fibrillation: Secondary | ICD-10-CM | POA: Insufficient documentation

## 2014-06-18 DIAGNOSIS — D696 Thrombocytopenia, unspecified: Secondary | ICD-10-CM | POA: Diagnosis not present

## 2014-06-18 DIAGNOSIS — S61011A Laceration without foreign body of right thumb without damage to nail, initial encounter: Secondary | ICD-10-CM

## 2014-06-18 DIAGNOSIS — S62521B Displaced fracture of distal phalanx of right thumb, initial encounter for open fracture: Secondary | ICD-10-CM | POA: Diagnosis present

## 2014-06-18 DIAGNOSIS — Z955 Presence of coronary angioplasty implant and graft: Secondary | ICD-10-CM | POA: Diagnosis not present

## 2014-06-18 DIAGNOSIS — E1140 Type 2 diabetes mellitus with diabetic neuropathy, unspecified: Secondary | ICD-10-CM | POA: Diagnosis not present

## 2014-06-18 DIAGNOSIS — I2510 Atherosclerotic heart disease of native coronary artery without angina pectoris: Secondary | ICD-10-CM | POA: Insufficient documentation

## 2014-06-18 HISTORY — PX: I&D EXTREMITY: SHX5045

## 2014-06-18 LAB — BASIC METABOLIC PANEL
Anion gap: 7 (ref 5–15)
BUN: 16 mg/dL (ref 6–20)
CALCIUM: 8.8 mg/dL — AB (ref 8.9–10.3)
CO2: 24 mmol/L (ref 22–32)
CREATININE: 0.98 mg/dL (ref 0.61–1.24)
Chloride: 104 mmol/L (ref 101–111)
GFR calc Af Amer: >60 mL/min (ref >60)
GLUCOSE: 154 mg/dL — AB (ref 70–99)
Potassium: 4.6 mmol/L (ref 3.5–5.1)
Sodium: 135 mmol/L (ref 135–145)

## 2014-06-18 LAB — CBC
HCT: 44.8 % (ref 39.0–52.0)
HEMOGLOBIN: 15 g/dL (ref 13.0–17.0)
MCH: 31.1 pg (ref 26.0–34.0)
MCHC: 33.5 g/dL (ref 30.0–36.0)
MCV: 92.8 fL (ref 78.0–100.0)
Platelets: 120 10*3/uL — ABNORMAL LOW (ref 150–400)
RBC: 4.83 MIL/uL (ref 4.22–5.81)
RDW: 14.1 % (ref 11.5–15.5)
WBC: 7.4 10*3/uL (ref 4.0–10.5)

## 2014-06-18 LAB — GLUCOSE, CAPILLARY
GLUCOSE-CAPILLARY: 118 mg/dL — AB (ref 70–99)
GLUCOSE-CAPILLARY: 116 mg/dL — AB (ref 70–99)

## 2014-06-18 SURGERY — IRRIGATION AND DEBRIDEMENT EXTREMITY
Anesthesia: Monitor Anesthesia Care | Site: Thumb | Laterality: Right

## 2014-06-18 MED ORDER — LIDOCAINE HCL (CARDIAC) 20 MG/ML IV SOLN
INTRAVENOUS | Status: AC
  Filled 2014-06-18: qty 5

## 2014-06-18 MED ORDER — FENTANYL CITRATE (PF) 100 MCG/2ML IJ SOLN
100.0000 ug | Freq: Once | INTRAMUSCULAR | Status: AC
  Administered 2014-06-18: 100 ug via INTRAVENOUS
  Filled 2014-06-18: qty 2

## 2014-06-18 MED ORDER — CEFAZOLIN SODIUM-DEXTROSE 2-3 GM-% IV SOLR
2.0000 g | Freq: Once | INTRAVENOUS | Status: AC
  Administered 2014-06-18: 2 g via INTRAVENOUS
  Filled 2014-06-18: qty 50

## 2014-06-18 MED ORDER — LACTATED RINGERS IV SOLN
INTRAVENOUS | Status: DC | PRN
  Administered 2014-06-18: 16:00:00 via INTRAVENOUS

## 2014-06-18 MED ORDER — OXYCODONE-ACETAMINOPHEN 5-325 MG PO TABS: 1.0000 | ORAL_TABLET | ORAL | Status: DC | PRN

## 2014-06-18 MED ORDER — LIDOCAINE HCL (PF) 1 % IJ SOLN
INTRAMUSCULAR | Status: DC | PRN
  Administered 2014-06-18: 5 mL
  Administered 2014-06-18: 30 mL

## 2014-06-18 MED ORDER — MIDAZOLAM HCL 5 MG/5ML IJ SOLN
INTRAMUSCULAR | Status: DC | PRN
  Administered 2014-06-18: 1 mg via INTRAVENOUS

## 2014-06-18 MED ORDER — MIDAZOLAM HCL 2 MG/2ML IJ SOLN
INTRAMUSCULAR | Status: AC
  Filled 2014-06-18: qty 2

## 2014-06-18 MED ORDER — LIDOCAINE HCL (PF) 1 % IJ SOLN
INTRAMUSCULAR | Status: AC
  Filled 2014-06-18: qty 30

## 2014-06-18 MED ORDER — FENTANYL CITRATE (PF) 100 MCG/2ML IJ SOLN
INTRAMUSCULAR | Status: DC | PRN
  Administered 2014-06-18: 50 ug via INTRAVENOUS

## 2014-06-18 MED ORDER — SODIUM CHLORIDE 0.9 % IR SOLN
Status: DC | PRN
  Administered 2014-06-18: 1000 mL

## 2014-06-18 MED ORDER — CEPHALEXIN 500 MG PO CAPS: 500.0000 mg | ORAL_CAPSULE | Freq: Four times a day (QID) | ORAL | Status: DC

## 2014-06-18 MED ORDER — PROPOFOL 10 MG/ML IV BOLUS
INTRAVENOUS | Status: AC
  Filled 2014-06-18: qty 20

## 2014-06-18 MED ORDER — SUCCINYLCHOLINE CHLORIDE 20 MG/ML IJ SOLN
INTRAMUSCULAR | Status: AC
Start: 2014-06-18 — End: 2014-06-18
  Filled 2014-06-18: qty 1

## 2014-06-18 MED ORDER — ONDANSETRON HCL 4 MG/2ML IJ SOLN
INTRAMUSCULAR | Status: DC | PRN
  Administered 2014-06-18: 4 mg via INTRAVENOUS

## 2014-06-18 MED ORDER — BUPIVACAINE HCL 0.5 % IJ SOLN
INTRAMUSCULAR | Status: DC | PRN
  Administered 2014-06-18: 5 mL

## 2014-06-18 MED ORDER — CEFAZOLIN SODIUM-DEXTROSE 2-3 GM-% IV SOLR: 2.0000 g | Freq: Three times a day (TID) | INTRAVENOUS | Status: DC

## 2014-06-18 MED ORDER — HYDROMORPHONE HCL 1 MG/ML IJ SOLN: 0.2500 mg | INTRAMUSCULAR | Status: DC | PRN

## 2014-06-18 MED ORDER — ROCURONIUM BROMIDE 50 MG/5ML IV SOLN
INTRAVENOUS | Status: AC
  Filled 2014-06-18: qty 1

## 2014-06-18 MED ORDER — ONDANSETRON HCL 4 MG/2ML IJ SOLN
INTRAMUSCULAR | Status: AC
  Filled 2014-06-18: qty 2

## 2014-06-18 MED ORDER — PROMETHAZINE HCL 25 MG/ML IJ SOLN: 6.2500 mg | INTRAMUSCULAR | Status: DC | PRN

## 2014-06-18 MED ORDER — FENTANYL CITRATE (PF) 100 MCG/2ML IJ SOLN
INTRAMUSCULAR | Status: AC
  Filled 2014-06-18: qty 2

## 2014-06-18 MED ORDER — FENTANYL CITRATE (PF) 250 MCG/5ML IJ SOLN
INTRAMUSCULAR | Status: AC
  Filled 2014-06-18: qty 5

## 2014-06-18 SURGICAL SUPPLY — 58 items
BNDG COHESIVE 1X5 TAN STRL LF (GAUZE/BANDAGES/DRESSINGS) ×6 IMPLANT
BNDG COHESIVE 4X5 TAN STRL (GAUZE/BANDAGES/DRESSINGS) IMPLANT
BNDG CONFORM 2 STRL LF (GAUZE/BANDAGES/DRESSINGS) ×6 IMPLANT
BNDG ESMARK 4X9 LF (GAUZE/BANDAGES/DRESSINGS) IMPLANT
BNDG GAUZE ELAST 4 BULKY (GAUZE/BANDAGES/DRESSINGS) IMPLANT
CANISTER SUCTION WELLS/JOHNSON (MISCELLANEOUS) ×3 IMPLANT
CHLORAPREP W/TINT 26ML (MISCELLANEOUS) IMPLANT
CORDS BIPOLAR (ELECTRODE) ×3 IMPLANT
COVER SURGICAL LIGHT HANDLE (MISCELLANEOUS) ×3 IMPLANT
CUFF TOURNIQUET SINGLE 18IN (TOURNIQUET CUFF) IMPLANT
CUFF TOURNIQUET SINGLE 24IN (TOURNIQUET CUFF) IMPLANT
DEPRESSOR TONGUE BLADE STERILE (MISCELLANEOUS) ×3 IMPLANT
DRAIN PENROSE 1/4X12 LTX STRL (WOUND CARE) ×3 IMPLANT
DRAPE SURG 17X23 STRL (DRAPES) ×6 IMPLANT
DRSG ADAPTIC 3X8 NADH LF (GAUZE/BANDAGES/DRESSINGS) IMPLANT
ELECT REM PT RETURN 9FT ADLT (ELECTROSURGICAL) ×3
ELECTRODE REM PT RTRN 9FT ADLT (ELECTROSURGICAL) ×1 IMPLANT
EVACUATOR 1/8 PVC DRAIN (DRAIN) IMPLANT
GAUZE SPONGE 2X2 8PLY STRL LF (GAUZE/BANDAGES/DRESSINGS) ×1 IMPLANT
GAUZE SPONGE 4X4 12PLY STRL (GAUZE/BANDAGES/DRESSINGS) IMPLANT
GAUZE XEROFORM 1X8 LF (GAUZE/BANDAGES/DRESSINGS) ×6 IMPLANT
GLOVE BIO SURGEON STRL SZ7 (GLOVE) ×6 IMPLANT
GLOVE BIO SURGEON STRL SZ7.5 (GLOVE) ×3 IMPLANT
GLOVE BIOGEL PI IND STRL 6 (GLOVE) ×1 IMPLANT
GLOVE BIOGEL PI IND STRL 6.5 (GLOVE) ×1 IMPLANT
GLOVE BIOGEL PI IND STRL 8 (GLOVE) ×1 IMPLANT
GLOVE BIOGEL PI INDICATOR 6 (GLOVE) ×2
GLOVE BIOGEL PI INDICATOR 6.5 (GLOVE) ×2
GLOVE BIOGEL PI INDICATOR 8 (GLOVE) ×2
GLOVE ECLIPSE 6.5 STRL STRAW (GLOVE) ×3 IMPLANT
GOWN STRL REUS W/ TWL LRG LVL3 (GOWN DISPOSABLE) ×3 IMPLANT
GOWN STRL REUS W/TWL LRG LVL3 (GOWN DISPOSABLE) ×6
HANDPIECE INTERPULSE COAX TIP (DISPOSABLE)
KIT BASIN OR (CUSTOM PROCEDURE TRAY) ×3 IMPLANT
KIT ROOM TURNOVER OR (KITS) ×3 IMPLANT
MANIFOLD NEPTUNE II (INSTRUMENTS) IMPLANT
NEEDLE 18GX1X1/2 (RX/OR ONLY) (NEEDLE) ×3 IMPLANT
NEEDLE HYPO 25GX1X1/2 BEV (NEEDLE) ×3 IMPLANT
NS IRRIG 1000ML POUR BTL (IV SOLUTION) ×3 IMPLANT
PACK ORTHO EXTREMITY (CUSTOM PROCEDURE TRAY) ×3 IMPLANT
PAD ARMBOARD 7.5X6 YLW CONV (MISCELLANEOUS) ×3 IMPLANT
SET HNDPC FAN SPRY TIP SCT (DISPOSABLE) IMPLANT
SPONGE GAUZE 2X2 STER 10/PKG (GAUZE/BANDAGES/DRESSINGS) ×2
SPONGE LAP 18X18 X RAY DECT (DISPOSABLE) IMPLANT
STOCKINETTE IMPERVIOUS 9X36 MD (GAUZE/BANDAGES/DRESSINGS) IMPLANT
SUT CHROMIC 6 0 PS 4 (SUTURE) ×3 IMPLANT
SUT ETHILON 4 0 PS 2 18 (SUTURE) IMPLANT
SUT PROLENE 1 CT (SUTURE) IMPLANT
SUT VICRYL RAPIDE 4/0 PS 2 (SUTURE) ×3 IMPLANT
SYR CONTROL 10ML LL (SYRINGE) ×3 IMPLANT
TOWEL OR 17X24 6PK STRL BLUE (TOWEL DISPOSABLE) ×3 IMPLANT
TOWEL OR 17X26 10 PK STRL BLUE (TOWEL DISPOSABLE) ×3 IMPLANT
TUBE ANAEROBIC SPECIMEN COL (MISCELLANEOUS) IMPLANT
TUBE CONNECTING 12'X1/4 (SUCTIONS) ×1
TUBE CONNECTING 12X1/4 (SUCTIONS) ×2 IMPLANT
TUBING CYSTO DISP (UROLOGICAL SUPPLIES) IMPLANT
UNDERPAD 30X30 INCONTINENT (UNDERPADS AND DIAPERS) ×3 IMPLANT
YANKAUER SUCT BULB TIP NO VENT (SUCTIONS) ×3 IMPLANT

## 2014-06-18 NOTE — ED Notes (Signed)
Pt working on metal press, the machine caught both of his thumbs. 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 Left thumb pad was sliced off by machine as well. Pt alert and oriented. BP 164/99, HR 88, 96% on room air. Pt has received of fentanyl.

## 2014-06-18 NOTE — Transfer of Care (Signed)
Immediate Anesthesia Transfer of Care Note  Patient: Eric Gallagher  Procedure(s) Performed: Procedure(s): OPEN TREATMENT RIGHT THUMB, INCOMPLETE AMPUTATION.  Washing and dressing applied to left thumb. (Right)  Patient Location: PACU  Anesthesia Type:MAC  Level of Consciousness: awake, alert , oriented and patient cooperative  Airway & Oxygen Therapy: Patient Spontanous Breathing and Patient connected to nasal cannula oxygen  Post-op Assessment: Report given to RN, Post -op Vital signs reviewed and stable, Patient moving all extremities and Patient moving all extremities X 4  Post vital signs: Reviewed and stable  Last Vitals:  Filed Vitals:   06/18/14 1330  BP: 157/104  Pulse: 80  Temp:   Resp: 18    Complications: No apparent anesthesia complications

## 2014-06-18 NOTE — ED Notes (Signed)
ASSESSMENT: RIGHT THUMB LACERATED AT DIP, FINGERNAIL INTACT, DECREASED SENSATION AT TIP. LEFT THUMB AVULSED ON PALMAR SURFACE OF DIP, BLEEDING CONTROLLED, CMS INTACT. NO OTHER COMPLAINTS AT THIS TIME

## 2014-06-18 NOTE — Anesthesia Preprocedure Evaluation (Addendum)
Anesthesia Evaluation  Patient identified by MRN, date of birth, ID band Patient awake    Reviewed: Allergy & Precautions, NPO status , Patient's Chart, lab work & pertinent test results  History of Anesthesia Complications Negative for: history of anesthetic complications  Airway Mallampati: II  TM Distance: >3 FB Neck ROM: Full    Dental  (+) Teeth Intact, Dental Advisory Given   Pulmonary former smoker,    Pulmonary exam normal       Cardiovascular hypertension, Pt. on medications and Pt. on home beta blockers + CAD, + Past MI and + Cardiac Stents Normal cardiovascular exam+ dysrhythmias Atrial Fibrillation  Study Conclusions  - Left ventricle: The cavity size was normal. There was mild concentric hypertrophy. - Left atrium: The atrium was severely dilated. - Right ventricle: Wall thickness was moderately to severely increased. Systolic function was mildly reduced. - Right atrium: The atrium was severely dilated. - Tricuspid valve: There was mild-moderate regurgitation directed centrally. - Pulmonary arteries: PA peak pressure: 37 mm Hg (S).     Neuro/Psych negative neurological ROS  negative psych ROS   GI/Hepatic negative GI ROS, Neg liver ROS,   Endo/Other  diabetes  Renal/GU negative Renal ROS     Musculoskeletal   Abdominal   Peds  Hematology   Anesthesia Other Findings   Reproductive/Obstetrics                           Anesthesia Physical Anesthesia Plan  ASA: III  Anesthesia Plan: MAC   Post-op Pain Management:    Induction: Intravenous  Airway Management Planned: Simple Face Mask  Additional Equipment:   Intra-op Plan:   Post-operative Plan:   Informed Consent: I have reviewed the patients History and Physical, chart, labs and discussed the procedure including the risks, benefits and alternatives for the proposed anesthesia with the patient or authorized  representative who has indicated his/her understanding and acceptance.   Dental advisory given  Plan Discussed with: CRNA, Anesthesiologist and Surgeon  Anesthesia Plan Comments:        Anesthesia Quick Evaluation

## 2014-06-18 NOTE — ED Notes (Signed)
Report from matt, rn.  Pt care assumed. 

## 2014-06-18 NOTE — Op Note (Signed)
06/18/2014  4:29 PM  PATIENT:  Eric Bellowsavid G Gallagher  74 y.o. male  PRE-OPERATIVE DIAGNOSIS:  Right thumb incomplete amputation; left thumb wound  POST-OPERATIVE DIAGNOSIS:  Same  PROCEDURE:   1. Debridement of right thumb distal phalanx open fracture to include skin, subcutaneous tissues, and bone    2. ORIF right thumb distal phalanx open fracture    3. Removal of right thumb nail plate    4. Right thumb nailbed repair    5. Right thumb simple laceration closure, 2 cm    6. Evaluation of left thumb wound and dressing application  SURGEON: Cliffton Astersavid A. Janee Mornhompson, MD  PHYSICIAN ASSISTANT: Danielle RankinKirsten Schrader, OPA-C  ANESTHESIA:  local and MAC  SPECIMENS:  None  DRAINS:   None  EBL:  less than 50 mL  PREOPERATIVE INDICATIONS:  Eric BellowsDavid G Landino is a  74 y.o. 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  The risks benefits and alternatives were discussed with the patient preoperatively including but not limited to the risks of infection, bleeding, nerve injury, cardiopulmonary complications, the need for revision surgery, among others, and the patient verbalized understanding and consented to proceed.  OPERATIVE IMPLANTS: 0.045 inch K wires 2  OPERATIVE PROCEDURE:  After receiving prophylactic antibiotics in the ED, the patient was escorted to the operative theatre and placed in a supine position.  A surgical "time-out" was performed during which the planned procedure, proposed operative site, and the correct patient identity were compared to the operative consent and agreement confirmed by the circulating nurse according to current facility policy.  Following installation of a digital block at the base of the right thumb,  a Penrose tourniquet was applied to the base of the digit and the thumb and hand were pre-scrub with a Hibiclens scrub brush before being formally prepped with Betadine and draped in usual sterile fashion. The Penrose was  removed prior to prepping and draping. On the left side, the wound was evaluated, found to be a wound roughly the size of a nickel lacking dermal coverage but with good pulp and subcutaneous tissues present. It was simply redressed with Xeroform on the raw area, gauze, and Coban.   Attention was then directed to the right thumb, where a Penrose tourniquet was again applied sterilely and the wound was evaluated after removal of the nail plate. It was a flexion type disruption with an intact volar bridge. The wound was irrigated copiously, and then debridement with scissor debridement performed of jagged and loose skin edges, subcutaneous tissues, and some bone fragments. Once satisfied with the degree of debridement, a 0.045 inch K wire was driven through the tip of the thumb, capturing the distal phalangeal distal fragment and securing it to the remainder of the distal phalanx. 2 of these were placed side-by-side. The skin was then repaired with 4-0 Vicryl Rapide interrupted suture and the nailbed repaired with 6-0 chromic interrupted suture. There was one area where the nail bed tissue was a little sparse, but I'm hopeful it is small enough to not create a tremendous nail growth deformity. Final images were obtained, and the K wires were bent over the dorsum of the thumb and clipped, placing Xeroform on the nail bed and up into the nail fold. A dressing was applied with a dorsal tongue blade component, and he was taken to recovery room.  DISPOSITION: He will be discharged home with typical instructions returning on Wednesday or Thursday of this week  to the office, with new x-rays of the right thumb out of the splint dressing and with a follow-on hand therapy appointment to construct a fingertip protective splint for the right side. On the left side, we will simply need to teach dressing changes.

## 2014-06-18 NOTE — Progress Notes (Signed)
Prior to having patient's son at bedside sign consent for patient verified with Dr. Janee Mornhompson that it did not need to include any wording regarding left thumb. Had patient's son at bedside sign consent for patient d/t bilateral thumb injury. Patient attempted to sign but unable d/t injury.

## 2014-06-18 NOTE — Anesthesia Postprocedure Evaluation (Signed)
Anesthesia Post Note  Patient: Eric BellowsDavid G Winkels  Procedure(s) Performed: Procedure(s) (LRB): OPEN TREATMENT RIGHT THUMB, INCOMPLETE AMPUTATION.  Washing and dressing applied to left thumb. (Right)  Anesthesia type: MAC  Patient location: PACU  Post pain: Pain level controlled  Post assessment: Patient's Cardiovascular Status Stable  Last Vitals:  Filed Vitals:   06/18/14 1738  BP: 171/103  Pulse: 76  Temp:   Resp: 32    Post vital signs: Reviewed and stable  Level of consciousness: sedated  Complications: No apparent anesthesia complications

## 2014-06-18 NOTE — Anesthesia Procedure Notes (Signed)
Procedure Name: MAC Date/Time: 06/18/2014 4:40 PM Performed by: Coralee RudFLORES, Azzure Garabedian

## 2014-06-18 NOTE — ED Provider Notes (Signed)
CSN: [id number]     Arrival date & time 06/18/14  1055 History   First MD Initiated Contact with Patient 06/18/14 1059     Chief Complaint  Patient presents with  . Finger Injury   Eric Gallagher is a 74 y.o. male with a past medical history significant for coronary artery disease, hyperlipidemia, and diabetes who presents with bilateral thumb injuries. The patient reports approximate one-hour prior to arrival, the patient was working with a sheet metal press and while pressing a piece of metal, the metal bent back and cut both of his thumbs. The patient is right-handed and reports that the end of his right thumb was "hanging by a thread" from the area of the base of the nail. The patient's left thumb had the pad cut off from the distal left thumb. The patient reports that he cannot feel anything past the injury on his right thumb. The patient denies any other pains visibly denying any discomfort in other fingers, wrists or more proximally. The patient denies any chest pain shortness of breath nausea, vomiting. The patient reports that he had a tetanus shot last year.   (Consider location/radiation/quality/duration/timing/severity/associated sxs/prior Treatment) Patient is a 74 y.o. male presenting with hand pain. The history is provided by the patient. No language interpreter was used.  Hand Pain This is a new problem. The current episode started today. The problem has been unchanged. Pertinent negatives include no chest pain, chills, congestion, coughing, fatigue, fever, headaches, nausea, neck pain, rash or vomiting. Nothing aggravates the symptoms. He has tried nothing for the symptoms. The treatment provided no relief.       Past Medical History  Diagnosis Date  . Hyperlipidemia   . Arrhythmia     Chronic AFIB, no anti-coag patient desire and low platelet count  . Thrombocytopenia     chronic  . 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 Coronary artery  disease     NSTEMI in 2005, Taxus DES LAD  . Hypertension   . BPH (benign prostatic hyperplasia)   . Chronic a-fib   . Old MI (myocardial infarction)   . ED (erectile dysfunction)    History reviewed. No pertinent past surgical history. Family History  Problem Relation Age of Onset  . CVA Mother   . Aneurysm Father   . Heart disease Brother   . Heart attack Maternal Aunt   . Heart disease Maternal Aunt   . Heart disease Brother    History  Substance Use Topics  . Smoking status: Former Games developermoker  . Smokeless tobacco: Not on file  . Alcohol Use: No    Review of Systems  Constitutional: Negative for fever, chills, activity change, appetite change (last PO was 1 cup of Coffee at 6:30 AM) and fatigue.  HENT: Negative for congestion.   Respiratory: Negative for cough.   Cardiovascular: Negative for chest pain and palpitations.  Gastrointestinal: Negative for nausea, vomiting, diarrhea and constipation.  Genitourinary: Negative for flank pain.  Musculoskeletal: Negative for neck pain and neck stiffness.  Skin: Positive for wound. Negative for rash.  Neurological: Negative for headaches.  Psychiatric/Behavioral: Negative for agitation.  All other systems reviewed and are negative.     Allergies  Flomax  Home Medications   Prior to Admission medications   Medication Sig Start Date End Date Taking? Authorizing Provider  alfuzosin (UROXATRAL) 10 MG 24 hr tablet Take 10 mg by mouth daily with breakfast.    Historical Provider,  MD  amLODipine (NORVASC) 5 MG tablet Take 5 mg by mouth daily.    Historical Provider, MD  aspirin 325 MG tablet Take 325 mg by mouth daily.    Historical Provider, MD  benazepril (LOTENSIN) 20 MG tablet Take 20 mg by mouth daily.    Historical Provider, MD  glipiZIDE (GLUCOTROL) 5 MG tablet Take 2.5 mg by mouth daily before breakfast. Patient taking one half of a 5 mg tablet (2.5mg ) daily.    Historical Provider, MD  metFORMIN (GLUCOPHAGE) 1000 MG tablet  Take 1,000 mg by mouth 2 (two) times daily with a meal.    Historical Provider, MD  metoprolol (LOPRESSOR) 100 MG tablet Take 100 mg by mouth 2 (two) times daily.    Historical Provider, MD  Multiple Vitamins-Minerals (MULTIVITAL PO) Take 1 tablet by mouth daily.    Historical Provider, MD  nitroGLYCERIN (NITROSTAT) 0.4 MG SL tablet Place 0.4 mg under the tongue every 5 (five) minutes as needed for chest pain.    Historical Provider, MD  simvastatin (ZOCOR) 40 MG tablet Take 40 mg by mouth every evening.    Historical Provider, MD   BP 160/113 mmHg  Pulse 98  Temp(Src) 98.2 F (36.8 C) (Oral)  Resp 17  SpO2 96% Physical Exam  Constitutional: He appears well-developed. No distress.  HENT:  Head: Normocephalic.  Mouth/Throat: No oropharyngeal exudate.  Eyes: Conjunctivae and EOM are normal. Pupils are equal, round, and reactive to light.  Neck: Normal range of motion.  Cardiovascular: Normal rate and normal heart sounds.   No murmur heard. Pulmonary/Chest: Effort normal and breath sounds normal. No stridor. No respiratory distress. He exhibits no tenderness.  Abdominal: Soft. Bowel sounds are normal. He exhibits no distension. There is no tenderness. There is no rebound and no guarding.  Musculoskeletal: He exhibits tenderness.       Right hand: He exhibits tenderness, decreased capillary refill, deformity and laceration. Decreased sensation noted. Normal strength noted.       Hands: Pt has a 2cm laceration in his R thumb distal to the DIP at the level of the nail bed. Pt has decreased sensation past the laceration and has decreased capillary refill. See clinical photo.   L thumb has an laceration pad of the L thumb. Pt has intact sensation and cap refill of the L thumb.   The pt has intact radial pulses bilaterally.   Neurological: He is alert. He exhibits normal muscle tone.  Skin: He is not diaphoretic.  Nursing note and vitals reviewed.   Right thumb    3:16 PM      ED  Course  Procedures (including critical care time) Labs Review Labs Reviewed  CBC - Abnormal; Notable for the following:    Platelets 120 (*)    All other components within normal limits  BASIC METABOLIC PANEL - Abnormal; Notable for the following:    Glucose, Bld 154 (*)    Calcium 8.8 (*)    All other components within normal limits    Imaging Review Dg Hand Complete Left  06/18/2014   CLINICAL DATA:  Thumbs caught in metal press  EXAM: LEFT HAND - COMPLETE 3+ VIEW  COMPARISON:  None.  FINDINGS: Frontal, oblique, and lateral views obtained. There is soft tissue injury to the volar aspect of the distal portion of the first distal phalanx. There is no acute fracture or dislocation. There is osteoarthritic change in all PIP and DIP joints as well as in the scaphotrapezial and saddle joints. No  erosive change.  IMPRESSION: Soft tissue injury along the volar aspect of the distal portion of first digit. No acute fracture or dislocation. Multifocal osteoarthritic change.   Electronically Signed   By: Bretta BangWilliam  Woodruff III M.D.   On: 06/18/2014 11:46   Dg Hand Complete Right  06/18/2014   CLINICAL DATA:  Thumb caught in metal press  EXAM: RIGHT HAND - COMPLETE 3+ VIEW  COMPARISON:  None.  FINDINGS: Frontal, oblique and lateral views were obtained. There is a comminuted fracture of the distal aspect of the first distal phalanx with mildly displaced fracture fragments. There is disruption at the inferior ungual region. No other fractures are apparent. No dislocation. There is osteoarthritic change in all PIP and DIP joints. There is also osteoarthritic change in the scaphotrapezial and saddle joints. No erosive change.  IMPRESSION: Comminuted fracture distal aspect first distal phalanx with associated soft tissue/ ungual injury. No other fractures. No dislocation. Multiple areas of osteoarthritic change.   Electronically Signed   By: Bretta BangWilliam  Woodruff III M.D.   On: 06/18/2014 11:44     EKG  Interpretation None      MDM   Final diagnoses:  Thumb laceration, right, initial encounter  Thumb laceration, left, initial encounter   Eric Gallagher is a right handed [age] y.o. male with a past medical history significant for coronary artery disease, hyperlipidemia, and diabetes who presents with bilateral thumb lacerations. The patient reports that he is up-to-date with his tetanus shot which she received last year. After arrival, the patient's thumb dressings were both removed and his hands were examined. The patient's right dominant hand had a 2-3 similar laceration transversely oriented past his distal interphalangeal joint of his right thumb. The laceration was located at the location of the base of the nailbed. The patient reported decreased sensation and capillary refill on initial exam. The patient had normal strength on his right hand exam. The patient's left hand examination revealed a 1 similar laceration of the pad of the left thumb. The patient was neurovascularly intact on his left thumb exam.  Given the patient's laceration, there was concern for possible fracture. The patient had bilateral hand x-rays which are resulted above. The patient's right thumb x-ray revealed a comminuted phalanx fracture.  Shortly after the determination of fracture, the hand surgery team was consulted for management recommendations. The patient's last oral intake was dinner at 5:30 PM the night before arrival as well as one cup of coffee at 6:30 AM this morning. The patient was given IV antibiotics for the open fracture and is awaiting evaluation by the hand team.  The patient was taken to the operating room for evaluation and further management of his finger lacerations. The patient required several doses of pain medication while in emergency department prior to moving. The patient had no other problems or consultations prior to transfer of care. Patient was transferred in stable condition.   This  patient's care was discussed with Dr. Gwendolyn GrantWalden, emergency medicine attending.     Eric Belfasthris Jeramyah Goodpasture, MD 06/18/14 1611  Elwin MochaBlair Walden, MD 06/19/14 [phone]

## 2014-06-18 NOTE — ED Notes (Signed)
Pt refuses to sign consent before talking to cardiologist regarding safety of anesthesia

## 2014-06-18 NOTE — Consult Note (Signed)
ORTHOPAEDIC CONSULTATION HISTORY & PHYSICAL REQUESTING PHYSICIAN: No att. providers found  Chief Complaint: Bilateral thumb injuries  HPI: Eric Gallagher is a 74 y.o. male who sustained bilateral injuries to the thumbs at work today in a machine that bends metal. On the right side, it has resulted in incomplete amputation of the tip through the midportion of the distal phalanx. There appears to be intact a volar skin bridge. On the left side, ED records indicate pulp avulsion has occurred.  Past Medical History  Diagnosis Date  . Hyperlipidemia   . Arrhythmia     Chronic AFIB, no anti-coag patient desire and low platelet count  . Thrombocytopenia     chronic  . 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 Coronary artery disease     NSTEMI in 2005, Taxus DES LAD  . Hypertension   . BPH (benign prostatic hyperplasia)   . Chronic a-fib   . Old MI (myocardial infarction)   . ED (erectile dysfunction)    History reviewed. No pertinent past surgical history. History   Social History  . Marital Status: Married    Spouse Name: N/A  . Number of Children: N/A  . Years of Education: N/A   Social History Main Topics  . Smoking status: Former Games developermoker  . Smokeless tobacco: Not on file  . Alcohol Use: No  . Drug Use: No  . Sexual Activity: Not on file   Other Topics Concern  . None   Social History Narrative   Family History  Problem Relation Age of Onset  . CVA Mother   . Aneurysm Father   . Heart disease Brother   . Heart attack Maternal Aunt   . Heart disease Maternal Aunt   . Heart disease Brother    Allergies  Allergen Reactions  . Flomax [Tamsulosin Hcl] Other (See Comments)    Dizzy   Prior to Admission medications   Medication Sig Start Date End Date Taking? Authorizing Provider  alfuzosin (UROXATRAL) 10 MG 24 hr tablet Take 10 mg by mouth at bedtime.    Yes Historical Provider, MD  amLODipine (NORVASC) 5 MG tablet Take 5 mg by mouth  daily.   Yes Historical Provider, MD  aspirin 325 MG tablet Take 325 mg by mouth daily.   Yes Historical Provider, MD  benazepril (LOTENSIN) 20 MG tablet Take 20 mg by mouth daily.   Yes Historical Provider, MD  glipiZIDE (GLUCOTROL) 5 MG tablet Take 2.5 mg by mouth 2 (two) times daily before a meal.    Yes Historical Provider, MD  metFORMIN (GLUCOPHAGE) 1000 MG tablet Take 1,000 mg by mouth 2 (two) times daily with a meal.   Yes Historical Provider, MD  metoprolol (LOPRESSOR) 100 MG tablet Take 100 mg by mouth 2 (two) times daily.   Yes Historical Provider, MD  Multiple Vitamins-Minerals (MULTIVITAL PO) Take 1 tablet by mouth daily.   Yes Historical Provider, MD  simvastatin (ZOCOR) 40 MG tablet Take 40 mg by mouth every evening.   Yes Historical Provider, MD  nitroGLYCERIN (NITROSTAT) 0.4 MG SL tablet Place 0.4 mg under the tongue every 5 (five) minutes as needed for chest pain.    Historical Provider, MD   Dg Hand Complete Left  06/18/2014   CLINICAL DATA:  Thumbs caught in metal press  EXAM: LEFT HAND - COMPLETE 3+ VIEW  COMPARISON:  None.  FINDINGS: Frontal, oblique, and lateral views obtained. There is soft tissue injury to the volar  aspect of the distal portion of the first distal phalanx. There is no acute fracture or dislocation. There is osteoarthritic change in all PIP and DIP joints as well as in the scaphotrapezial and saddle joints. No erosive change.  IMPRESSION: Soft tissue injury along the volar aspect of the distal portion of first digit. No acute fracture or dislocation. Multifocal osteoarthritic change.   Electronically Signed   By: Bretta BangWilliam  Woodruff III M.D.   On: 06/18/2014 11:46   Dg Hand Complete Right  06/18/2014   CLINICAL DATA:  Thumb caught in metal press  EXAM: RIGHT HAND - COMPLETE 3+ VIEW  COMPARISON:  None.  FINDINGS: Frontal, oblique and lateral views were obtained. There is a comminuted fracture of the distal aspect of the first distal phalanx with mildly displaced  fracture fragments. There is disruption at the inferior ungual region. No other fractures are apparent. No dislocation. There is osteoarthritic change in all PIP and DIP joints. There is also osteoarthritic change in the scaphotrapezial and saddle joints. No erosive change.  IMPRESSION: Comminuted fracture distal aspect first distal phalanx with associated soft tissue/ ungual injury. No other fractures. No dislocation. Multiple areas of osteoarthritic change.   Electronically Signed   By: Bretta BangWilliam  Woodruff III M.D.   On: 06/18/2014 11:44    Positive ROS: All other systems have been reviewed and were otherwise negative with the exception of those mentioned in the HPI and as above.  Physical Exam: Vitals: Refer to EMR. Constitutional:  WD, WN, NAD HEENT:  NCAT, EOMI Neuro/Psych:  Alert & oriented to person, place, and time; appropriate mood & affect Lymphatic: No generalized extremity edema or lymphadenopathy Extremities / MSK:  The extremities are normal with respect to appearance, ranges of motion, joint stability, muscle strength/tone, sensation, & perfusion except as otherwise noted:  The right side, the bandages are removed. The nail plate has been pulled out of its fold, with laceration leaving an intact skin bridge. There is obvious fracture through the distal phalanx as well. The distal tip is a little darkened and dusky. There is poor sensibility to light touch.  On the left side, the bandages are adherent and not to be examined further until the operating room  Assessment: Bilateral thumb trauma, right side more significant with incomplete amputation of the tip  Plan: I discussed these findings with him and his stepson. I recommended further assessment of both injuries in the operating room. On the right side we will likely need to reduce and pin the distal phalanx fracture and repair the soft tissues. On the left side, it may be that just a dressing is required at this point. Questions  were invited and answered. Informed consent obtained.  His tetanus is up-to-date, he is already received IV antibiotics in the emergency department, and will be discharged with pain medicines and oral antibiotics.  Cliffton Astersavid A. Janee Mornhompson, MD      Orthopaedic & Hand Surgery [facility] [address] [place], [place]  [address] Office: [phone] Mobile: [phone]

## 2014-06-18 NOTE — Discharge Instructions (Signed)
Discharge Instructions   You have a dressing on both thumbs. Move your fingers as much as possible, making a full fist and fully opening the fist. Elevate your hand to reduce pain & swelling of the digits.  Ice over the operative site may be helpful to reduce pain & swelling.  DO NOT USE HEAT. Pain medicine has been prescribed for you.  Use your medicine as needed over the first 48 hours, and then you can begin to taper your use.  You may use Tylenol in place of your prescribed pain medication, but not IN ADDITION to it. Leave the dressing in place until you return to our office.  You may shower, but keep the bandage clean & dry.  You may drive a car when you are off of prescription pain medications and can safely control your vehicle with both hands.  Please call [phone] during normal business hours or [phone] after hours for any problems. Including the following:  - excessive redness of the incisions - drainage for more than 4 days - fever of more than 101.5 F  *Please note that pain medications will not be refilled after hours or on weekends.

## 2014-06-18 NOTE — ED Notes (Signed)
Patient transported to X-ray 

## 2014-06-19 ENCOUNTER — Encounter (HOSPITAL_COMMUNITY): Payer: Self-pay | Admitting: Orthopedic Surgery

## 2014-07-12 ENCOUNTER — Encounter: Payer: Self-pay | Admitting: Interventional Cardiology

## 2014-07-12 ENCOUNTER — Ambulatory Visit (INDEPENDENT_AMBULATORY_CARE_PROVIDER_SITE_OTHER): Payer: Medicare Other | Admitting: Interventional Cardiology

## 2014-07-12 VITALS — BP 154/86 | HR 104 | Ht 75.0 in | Wt 224.1 lb

## 2014-07-12 DIAGNOSIS — R0609 Other forms of dyspnea: Secondary | ICD-10-CM

## 2014-07-12 DIAGNOSIS — I25118 Atherosclerotic heart disease of native coronary artery with other forms of angina pectoris: Secondary | ICD-10-CM

## 2014-07-12 DIAGNOSIS — E1159 Type 2 diabetes mellitus with other circulatory complications: Secondary | ICD-10-CM

## 2014-07-12 DIAGNOSIS — I482 Chronic atrial fibrillation, unspecified: Secondary | ICD-10-CM

## 2014-07-12 NOTE — Progress Notes (Signed)
Cardiology Office Note   Date:  07/12/2014   ID:  Eric Gallagher, DOB 1940/11/13, MRN [id number]  PCP:  Eric Housekeeper, MD  Cardiologist:  Eric Noe, MD   No chief complaint on file.     History of Present Illness: Eric Gallagher is a 74 y.o. male who presents for coronary artery disease, hypertension, diabetes, and dyspnea. He has a history of chronic thrombocytopenia.  He currently continues to complain of exertional dyspnea and fatigue. This is out of proportion to the cardiac workup to this point. LV function is normal. His right ventricular hypertrophy. He has prior history of Taxus drug-eluting stent in the LAD. He has not had syncope. He denies edema. Feels he can't do much of anything without giving out. Pulmonary emboli of been excluded with a CT angiogram. LV function is normal by echo. 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 Mild pulmonary hypertension is also noted.    Past Medical History  Diagnosis Date  . Hyperlipidemia   . Arrhythmia     Chronic AFIB, no anti-coag patient desire and low platelet count  . Thrombocytopenia     chronic  . 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 Coronary artery disease     NSTEMI in 2005, Taxus DES LAD  . Hypertension   . BPH (benign prostatic hyperplasia)   . Chronic a-fib   . Old MI (myocardial infarction)   . ED (erectile dysfunction)     Past Surgical History  Procedure Laterality Date  . I&d extremity Right 06/18/2014    Procedure: OPEN TREATMENT RIGHT THUMB, INCOMPLETE AMPUTATION.  Washing and dressing applied to left thumb.;  Surgeon: Mack Hook, MD;  [facility] OR;  Service: Orthopedics;  Laterality: Right;     Current Outpatient Prescriptions  Medication Sig Dispense Refill  . amLODipine (NORVASC) 5 MG tablet Take 5 mg by mouth daily.    Marland Kitchen aspirin 325 MG tablet Take 325 mg by mouth daily.    . benazepril (LOTENSIN) 20 MG tablet Take 20 mg by mouth daily.    Marland Kitchen glipiZIDE (GLUCOTROL) 5 MG  tablet Take 2.5 mg by mouth 2 (two) times daily before a meal.     . metFORMIN (GLUCOPHAGE) 1000 MG tablet Take 1,000 mg by mouth 2 (two) times daily with a meal.    . metoprolol (LOPRESSOR) 100 MG tablet Take 100 mg by mouth 2 (two) times daily.    . Multiple Vitamins-Minerals (MULTIVITAL PO) Take 1 tablet by mouth daily.    . nitroGLYCERIN (NITROSTAT) 0.4 MG SL tablet Place 0.4 mg under the tongue every 5 (five) minutes as needed for chest pain.    Marland Kitchen oxyCODONE-acetaminophen (ROXICET) 5-325 MG per tablet Take 1-2 tablets by mouth every 4 (four) hours as needed. 60 tablet 0  . simvastatin (ZOCOR) 40 MG tablet Take 40 mg by mouth every evening.     No current facility-administered medications for this visit.    Allergies:   Flomax    Social History:  The patient  reports that he has quit smoking. He has never used smokeless tobacco. He reports that he does not drink alcohol or use illicit drugs.   Family History:  The patient's family history includes Aneurysm in his father; CVA in his mother; Heart attack in his maternal aunt; Heart disease in his brother, brother, and maternal aunt.    ROS:  Please see the history of present illness.   Otherwise, review of systems are  positive for shortness of breath, occasional chest pressure, and chest tightness..   All other systems are reviewed and negative.    PHYSICAL EXAM: VS:  BP 154/86 mmHg  Pulse 104  Ht 6\' 3"  (1.905 m)  Wt 224 lb 1.9 oz (101.66 kg)  BMI 28.01 kg/m2  SpO2 96% , BMI Body mass index is 28.01 kg/(m^2). GEN: Well nourished, well developed, in no acute distress HEENT: normal Neck: no JVD, carotid bruits, or masses Cardiac: IIRR; no murmurs, rubs, or gallops,no edema  Respiratory:  clear to auscultation bilaterally, normal work of breathing GI: soft, nontender, nondistended, + BS MS: no deformity or atrophy Skin: warm and dry, no rash Neuro:  Strength and sensation are intact Psych: euthymic mood, full affect   EKG:  EKG  is not ordered today.    Recent Labs: 05/29/2014: Pro B Natriuretic peptide (BNP) 265.0* 06/18/2014: BUN 16; Creatinine 0.98; Hemoglobin 15.0; Platelets 120*; Potassium 4.6; Sodium 135    Lipid Panel No results found for: CHOL, TRIG, HDL, CHOLHDL, VLDL, LDLCALC, LDLDIRECT    Wt Readings from Last 3 Encounters:  07/12/14 224 lb 1.9 oz (101.66 kg)  05/29/14 230 lb 12.8 oz (104.69 kg)      Other studies Reviewed: Additional studies/ records that were reviewed today include: . Review of the above records demonstrates:   ASSESSMENT AND PLAN:  Chronic atrial fibrillation: borderline rate control  Coronary artery disease involving native coronary artery of native heart with other form of angina pectoris  Dyspnea on exertion: The cause is uncertain. 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 His BNP is mildly elevated. LV systolic function is normal. Progression of coronary disease or pulmonary hypertension could be the explanation for the RVH.  Type 2 diabetes mellitus with other circulatory complications     Current medicines are reviewed at length with the patient today.  The patient does not have concerns regarding medicines.  The following changes have been made:  no change  After considering the patient's complaints and the findings as noted above, have decided that right and left heart catheterization with coronary angiography is needed to rule out pulmonary hypertension (mild by echo and in a range that would not cause right ventricular hypertrophy) to exclude his heart as a pertussis up and in his chronic complaint of dyspnea. The CT scan did not refill interstitial lung disease or pulmonary emboli. Risk of stroke, death, myocardial infarction, bleeding, infection, among others were discussed in detail with the patient and accepted.  Labs/ tests ordered today include:  No orders of the defined types were placed in this encounter.     Disposition:   FU with HS in PRN after heart  cath    Signed, Eric NoeSMITH III,HENRY W, MD  07/12/2014 12:14 PM    [facility] [address], [place], [place]  [address] Phone: [phone]; Fax: [phone]

## 2014-07-12 NOTE — Patient Instructions (Signed)
Medication Instructions:  Your physician recommends that you continue on your current medications as directed. Please refer to the Current Medication list given to you today.   Labwork: None   Testing/Procedures: Your physician has requested that you have a cardiac catheterization. Cardiac catheterization is used to diagnose and/or treat various heart conditions. Doctors may recommend this procedure for a number of different reasons. The most common reason is to evaluate chest pain. Chest pain can be a symptom of coronary artery disease (CAD), and cardiac catheterization can show whether plaque is narrowing or blocking your heart's arteries. This procedure is also used to evaluate the valves, as well as measure the blood flow and oxygen levels in different parts of your heart. For further information please visit [email or web address]. Please follow instruction sheet, as given.  Please call the office when you decide to proceed  Follow-Up: Your physician recommends that you schedule a follow-up appointment pending cardiac cath   Any Other Special Instructions Will Be Listed Below (If Applicable).

## 2014-07-16 ENCOUNTER — Telehealth: Payer: Self-pay | Admitting: Interventional Cardiology

## 2014-07-16 DIAGNOSIS — Z01812 Encounter for preprocedural laboratory examination: Secondary | ICD-10-CM

## 2014-07-16 NOTE — Telephone Encounter (Signed)
Follow up       June 14th is a great day to have the cath if possible.

## 2014-07-16 NOTE — Telephone Encounter (Signed)
Pt aware cardiac cath scheduled with Dr.Smith for 6/14 @ 9am. Pt will come to the office for preprocedure labs on 6/10. Pt given verbal preprocedure instructions, written instructions will be left at the front desk for pt to pick up when he comes in for labs. Pt verbalized understanding.

## 2014-07-16 NOTE — Telephone Encounter (Signed)
New problem   Pt calling to set up his Cath..please call pt.

## 2014-07-18 ENCOUNTER — Other Ambulatory Visit: Payer: Self-pay | Admitting: Interventional Cardiology

## 2014-07-20 ENCOUNTER — Other Ambulatory Visit (INDEPENDENT_AMBULATORY_CARE_PROVIDER_SITE_OTHER): Payer: Medicare Other | Admitting: *Deleted

## 2014-07-20 DIAGNOSIS — I482 Chronic atrial fibrillation: Secondary | ICD-10-CM

## 2014-07-20 DIAGNOSIS — I25118 Atherosclerotic heart disease of native coronary artery with other forms of angina pectoris: Secondary | ICD-10-CM | POA: Diagnosis not present

## 2014-07-20 DIAGNOSIS — Z01812 Encounter for preprocedural laboratory examination: Secondary | ICD-10-CM | POA: Diagnosis not present

## 2014-07-20 DIAGNOSIS — R0600 Dyspnea, unspecified: Secondary | ICD-10-CM | POA: Diagnosis not present

## 2014-07-20 LAB — CBC WITH DIFFERENTIAL/PLATELET
BASOS PCT: 0.5 % (ref 0.0–3.0)
Basophils Absolute: 0 10*3/uL (ref 0.0–0.1)
Eosinophils Absolute: 0.2 10*3/uL (ref 0.0–0.7)
Eosinophils Relative: 3.2 % (ref 0.0–5.0)
HEMATOCRIT: 47.6 % (ref 39.0–52.0)
Hemoglobin: 15.7 g/dL (ref 13.0–17.0)
LYMPHS PCT: 15.8 % (ref 12.0–46.0)
Lymphs Abs: 0.9 10*3/uL (ref 0.7–4.0)
MCHC: 33 g/dL (ref 30.0–36.0)
MCV: 93.5 fl (ref 78.0–100.0)
MONOS PCT: 7.6 % (ref 3.0–12.0)
Monocytes Absolute: 0.5 10*3/uL (ref 0.1–1.0)
NEUTROS ABS: 4.4 10*3/uL (ref 1.4–7.7)
NEUTROS PCT: 72.9 % (ref 43.0–77.0)
PLATELETS: 118 10*3/uL — AB (ref 150.0–400.0)
RBC: 5.09 Mil/uL (ref 4.22–5.81)
RDW: 13.9 % (ref 11.5–15.5)
WBC: 6 10*3/uL (ref 4.0–10.5)

## 2014-07-20 LAB — BASIC METABOLIC PANEL
BUN: 14 mg/dL (ref 6–23)
CALCIUM: 9.2 mg/dL (ref 8.4–10.5)
CO2: 29 mEq/L (ref 19–32)
Chloride: 101 mEq/L (ref 96–112)
Creatinine, Ser: 0.95 mg/dL (ref 0.40–1.50)
GFR: 82.07 mL/min (ref >60.00)
GLUCOSE: 257 mg/dL — AB (ref 70–99)
Potassium: 4.6 mEq/L (ref 3.5–5.1)
SODIUM: 135 meq/L (ref 135–145)

## 2014-07-20 LAB — PROTIME-INR
INR: 1 ratio (ref 0.8–1.0)
Prothrombin Time: 11.6 s (ref 9.6–13.1)

## 2014-07-20 NOTE — Addendum Note (Signed)
Addended by: Tonita Phoenix on: 07/20/2014 09:52 AM   Modules accepted: Orders

## 2014-07-24 ENCOUNTER — Ambulatory Visit (HOSPITAL_COMMUNITY)
Admission: RE | Admit: 2014-07-24 | Discharge: 2014-07-24 | Disposition: A | Discharge status: Home / Self Care | Payer: Medicare Other | Source: Ambulatory Visit | Attending: Interventional Cardiology | Admitting: Interventional Cardiology

## 2014-07-24 ENCOUNTER — Encounter (HOSPITAL_COMMUNITY)
Admission: RE | Disposition: A | Discharge status: Home / Self Care | Payer: Medicare Other | Source: Ambulatory Visit | Attending: Interventional Cardiology

## 2014-07-24 DIAGNOSIS — I4821 Permanent atrial fibrillation: Secondary | ICD-10-CM | POA: Diagnosis present

## 2014-07-24 DIAGNOSIS — I25118 Atherosclerotic heart disease of native coronary artery with other forms of angina pectoris: Secondary | ICD-10-CM | POA: Diagnosis not present

## 2014-07-24 DIAGNOSIS — R0600 Dyspnea, unspecified: Secondary | ICD-10-CM | POA: Diagnosis present

## 2014-07-24 DIAGNOSIS — N400 Enlarged prostate without lower urinary tract symptoms: Secondary | ICD-10-CM | POA: Insufficient documentation

## 2014-07-24 DIAGNOSIS — I482 Chronic atrial fibrillation, unspecified: Secondary | ICD-10-CM

## 2014-07-24 DIAGNOSIS — E1159 Type 2 diabetes mellitus with other circulatory complications: Secondary | ICD-10-CM | POA: Diagnosis not present

## 2014-07-24 DIAGNOSIS — I252 Old myocardial infarction: Secondary | ICD-10-CM | POA: Insufficient documentation

## 2014-07-24 DIAGNOSIS — Z955 Presence of coronary angioplasty implant and graft: Secondary | ICD-10-CM | POA: Diagnosis not present

## 2014-07-24 DIAGNOSIS — E785 Hyperlipidemia, unspecified: Secondary | ICD-10-CM | POA: Insufficient documentation

## 2014-07-24 DIAGNOSIS — E1140 Type 2 diabetes mellitus with diabetic neuropathy, unspecified: Secondary | ICD-10-CM | POA: Insufficient documentation

## 2014-07-24 DIAGNOSIS — Z87891 Personal history of nicotine dependence: Secondary | ICD-10-CM | POA: Insufficient documentation

## 2014-07-24 DIAGNOSIS — I10 Essential (primary) hypertension: Secondary | ICD-10-CM | POA: Diagnosis not present

## 2014-07-24 DIAGNOSIS — Z8249 Family history of ischemic heart disease and other diseases of the circulatory system: Secondary | ICD-10-CM | POA: Insufficient documentation

## 2014-07-24 DIAGNOSIS — N529 Male erectile dysfunction, unspecified: Secondary | ICD-10-CM | POA: Insufficient documentation

## 2014-07-24 DIAGNOSIS — D696 Thrombocytopenia, unspecified: Secondary | ICD-10-CM | POA: Diagnosis not present

## 2014-07-24 DIAGNOSIS — I25119 Atherosclerotic heart disease of native coronary artery with unspecified angina pectoris: Secondary | ICD-10-CM | POA: Diagnosis present

## 2014-07-24 DIAGNOSIS — R0609 Other forms of dyspnea: Secondary | ICD-10-CM | POA: Diagnosis present

## 2014-07-24 DIAGNOSIS — Z7982 Long term (current) use of aspirin: Secondary | ICD-10-CM | POA: Diagnosis not present

## 2014-07-24 DIAGNOSIS — I2510 Atherosclerotic heart disease of native coronary artery without angina pectoris: Secondary | ICD-10-CM | POA: Diagnosis not present

## 2014-07-24 HISTORY — PX: CARDIAC CATHETERIZATION: SHX172

## 2014-07-24 LAB — GLUCOSE, CAPILLARY
Glucose-Capillary: 169 mg/dL — ABNORMAL HIGH (ref 65–99)
Glucose-Capillary: 131 mg/dL — ABNORMAL HIGH (ref 65–99)

## 2014-07-24 SURGERY — RIGHT/LEFT HEART CATH AND CORONARY ANGIOGRAPHY
Anesthesia: LOCAL

## 2014-07-24 MED ORDER — SODIUM CHLORIDE 0.9 % WEIGHT BASED INFUSION
1.0000 mL/kg/h | INTRAVENOUS | Status: DC
Start: 2014-07-24 — End: 2014-07-24

## 2014-07-24 MED ORDER — VERAPAMIL HCL 2.5 MG/ML IV SOLN
INTRAVENOUS | Status: DC | PRN
  Administered 2014-07-24: 10:00:00 via INTRA_ARTERIAL

## 2014-07-24 MED ORDER — SODIUM CHLORIDE 0.9 % IV SOLN: 250.0000 mL | INTRAVENOUS | Status: DC | PRN

## 2014-07-24 MED ORDER — HEPARIN (PORCINE) IN NACL 2-0.9 UNIT/ML-% IJ SOLN
INTRAMUSCULAR | Status: AC
  Filled 2014-07-24: qty 1500

## 2014-07-24 MED ORDER — HEPARIN SODIUM (PORCINE) 1000 UNIT/ML IJ SOLN
INTRAMUSCULAR | Status: AC
  Filled 2014-07-24: qty 1

## 2014-07-24 MED ORDER — SODIUM CHLORIDE 0.9 % IJ SOLN: 3.0000 mL | Freq: Two times a day (BID) | INTRAMUSCULAR | Status: DC

## 2014-07-24 MED ORDER — ACETAMINOPHEN 325 MG PO TABS: 650.0000 mg | ORAL_TABLET | ORAL | Status: DC | PRN

## 2014-07-24 MED ORDER — MIDAZOLAM HCL 2 MG/2ML IJ SOLN
INTRAMUSCULAR | Status: DC | PRN
  Administered 2014-07-24: 1 mg via INTRAVENOUS

## 2014-07-24 MED ORDER — SODIUM CHLORIDE 0.9 % WEIGHT BASED INFUSION
3.0000 mL/kg/h | INTRAVENOUS | Status: AC
  Administered 2014-07-24: 3 mL/kg/h via INTRAVENOUS

## 2014-07-24 MED ORDER — ONDANSETRON HCL 4 MG/2ML IJ SOLN: 4.0000 mg | Freq: Four times a day (QID) | INTRAMUSCULAR | Status: DC | PRN

## 2014-07-24 MED ORDER — OXYCODONE-ACETAMINOPHEN 5-325 MG PO TABS: 1.0000 | ORAL_TABLET | ORAL | Status: DC | PRN

## 2014-07-24 MED ORDER — MIDAZOLAM HCL 2 MG/2ML IJ SOLN
INTRAMUSCULAR | Status: AC
  Filled 2014-07-24: qty 2

## 2014-07-24 MED ORDER — FENTANYL CITRATE (PF) 100 MCG/2ML IJ SOLN
INTRAMUSCULAR | Status: DC | PRN
  Administered 2014-07-24: 50 ug via INTRAVENOUS

## 2014-07-24 MED ORDER — HEPARIN SODIUM (PORCINE) 1000 UNIT/ML IJ SOLN
INTRAMUSCULAR | Status: DC | PRN
  Administered 2014-07-24: 5000 [IU] via INTRAVENOUS

## 2014-07-24 MED ORDER — ASPIRIN 81 MG PO CHEW
CHEWABLE_TABLET | ORAL | Status: AC
  Filled 2014-07-24: qty 1

## 2014-07-24 MED ORDER — ASPIRIN 81 MG PO CHEW
81.0000 mg | CHEWABLE_TABLET | ORAL | Status: AC
  Administered 2014-07-24: 81 mg via ORAL

## 2014-07-24 MED ORDER — SODIUM CHLORIDE 0.9 % IJ SOLN: 3.0000 mL | INTRAMUSCULAR | Status: DC | PRN

## 2014-07-24 MED ORDER — VERAPAMIL HCL 2.5 MG/ML IV SOLN
INTRAVENOUS | Status: AC
  Filled 2014-07-24: qty 2

## 2014-07-24 MED ORDER — FENTANYL CITRATE (PF) 100 MCG/2ML IJ SOLN
INTRAMUSCULAR | Status: AC
  Filled 2014-07-24: qty 2

## 2014-07-24 MED ORDER — LIDOCAINE HCL (PF) 1 % IJ SOLN
INTRAMUSCULAR | Status: AC
  Filled 2014-07-24: qty 30

## 2014-07-24 MED ORDER — NITROGLYCERIN 1 MG/10 ML FOR IR/CATH LAB
INTRA_ARTERIAL | Status: AC
  Filled 2014-07-24: qty 10

## 2014-07-24 MED ORDER — SODIUM CHLORIDE 0.9 % WEIGHT BASED INFUSION: 1.0000 mL/kg/h | INTRAVENOUS | Status: DC

## 2014-07-24 SURGICAL SUPPLY — 18 items
CATH BALLN WEDGE 5F 110CM (CATHETERS) ×2 IMPLANT
CATH INFINITI 5 FR JL3.5 (CATHETERS) ×2 IMPLANT
CATH INFINITI JR4 5F (CATHETERS) ×2 IMPLANT
CATH SITESEER 5F MULTI A 2 (CATHETERS) IMPLANT
CATH SWAN GANZ 7F STRAIGHT (CATHETERS) IMPLANT
DEVICE RAD COMP TR BAND LRG (VASCULAR PRODUCTS) ×2 IMPLANT
GLIDESHEATH SLEND A-KIT 6F 22G (SHEATH) ×2 IMPLANT
KIT HEART LEFT (KITS) ×2 IMPLANT
KIT HEART RIGHT NAMIC (KITS) ×2 IMPLANT
PACK CARDIAC CATHETERIZATION (CUSTOM PROCEDURE TRAY) ×2 IMPLANT
SHEATH FAST CATH BRACH 5F 5CM (SHEATH) ×2 IMPLANT
SHEATH PINNACLE 5F 10CM (SHEATH) IMPLANT
SHEATH PINNACLE 7F 10CM (SHEATH) IMPLANT
TRANSDUCER W/STOPCOCK (MISCELLANEOUS) ×2 IMPLANT
TUBING CIL FLEX 10 FLL-RA (TUBING) ×2 IMPLANT
WIRE EMERALD 3MM-J .035X150CM (WIRE) IMPLANT
WIRE HI TORQ VERSACORE-J 145CM (WIRE) ×2 IMPLANT
WIRE SAFE-T 1.5MM-J .035X260CM (WIRE) ×2 IMPLANT

## 2014-07-24 NOTE — Discharge Instructions (Signed)
Radial Site Care Refer to this sheet in the next few weeks. These instructions provide you with information on caring for yourself after your procedure. Your caregiver may also give you more specific instructions. Your treatment has been planned according to current medical practices, but problems sometimes occur. Call your caregiver if you have any problems or questions after your procedure. HOME CARE INSTRUCTIONS  You may shower the day after the procedure.Remove the bandage (dressing) and gently wash the site with plain soap and water.Gently pat the site dry.  Do not apply powder or lotion to the site.  Do not submerge the affected site in water for 3 to 5 days.  Inspect the site at least twice daily.  Do not flex or bend the affected arm for 24 hours.  No lifting over 5 pounds (2.3 kg) for 5 days after your procedure.  Do not drive home if you are discharged the same day of the procedure. Have someone else drive you.  You may drive 24 hours after the procedure unless otherwise instructed by your caregiver.  Do not operate machinery or power tools for 24 hours.  A responsible adult should be with you for the first 24 hours after you arrive home. What to expect:  Any bruising will usually fade within 1 to 2 weeks.  Blood that collects in the tissue (hematoma) may be painful to the touch. It should usually decrease in size and tenderness within 1 to 2 weeks. SEEK IMMEDIATE MEDICAL CARE IF:  You have unusual pain at the radial site.  You have redness, warmth, swelling, or pain at the radial site.  You have drainage (other than a small amount of blood on the dressing).  You have chills.  You have a fever or persistent symptoms for more than 72 hours.  You have a fever and your symptoms suddenly get worse.  Your arm becomes pale, cool, tingly, or numb.  You have heavy bleeding from the site. Hold pressure on the site. Document Released: 02/28/2010 Document Revised:  04/20/2011 Document Reviewed: 02/28/2010 [facility] Patient Information 2015 [place], [place]. This information is not intended to replace advice given to you by your health care provider. Make sure you discuss any questions you have with your health care provider. Venogram, Care After Refer to this sheet in the next few weeks. These instructions provide you with information on caring for yourself after your procedure. Your health care provider may also give you more specific instructions. Your treatment has been planned according to current medical practices, but problems sometimes occur. Call your health care provider if you have any problems or questions after your procedure. WHAT TO EXPECT AFTER THE PROCEDURE After your procedure, it is typical to have the following sensations:  Mild discomfort at the catheter insertion site. HOME CARE INSTRUCTIONS   Take all medicines exactly as directed.  Follow any prescribed diet.  Follow instructions regarding both rest and physical activity.  Drink more fluids for the first several days after the procedure in order to help flush dye from your kidneys. SEEK MEDICAL CARE IF:  You develop a rash.  You have fever not controlled by medicine. SEEK IMMEDIATE MEDICAL CARE IF:  There is pain, drainage, bleeding, redness, swelling, warmth or a red streak at the site of the IV tube.  The extremity where your IV tube was placed becomes discolored, numb, or cool.  You have difficulty breathing or shortness of breath.  You develop chest pain.  You have excessive dizziness or fainting.  Document Released: 11/16/2012 Document Revised: 01/31/2013 Document Reviewed: 11/16/2012 [facility] Patient Information 2015 [place], [place]. This information is not intended to replace advice given to you by your health care provider. Make sure you discuss any questions you have with your health care provider.

## 2014-07-24 NOTE — H&P (View-Only) (Signed)
Cardiology Office Note   Date:  07/12/2014   ID:  Eric Gallagher, DOB 1940/11/13, MRN [id number]  PCP:  Georgann Housekeeper, MD  Cardiologist:  Lesleigh Noe, MD   No chief complaint on file.     History of Present Illness: Eric Gallagher is a 74 y.o. male who presents for coronary artery disease, hypertension, diabetes, and dyspnea. He has a history of chronic thrombocytopenia.  He currently continues to complain of exertional dyspnea and fatigue. This is out of proportion to the cardiac workup to this point. LV function is normal. His right ventricular hypertrophy. He has prior history of Taxus drug-eluting stent in the LAD. He has not had syncope. He denies edema. Feels he can't do much of anything without giving out. Pulmonary emboli of been excluded with a CT angiogram. LV function is normal by echo. 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 Mild pulmonary hypertension is also noted.    Past Medical History  Diagnosis Date  . Hyperlipidemia   . Arrhythmia     Chronic AFIB, no anti-coag patient desire and low platelet count  . Thrombocytopenia     chronic  . 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 Coronary artery disease     NSTEMI in 2005, Taxus DES LAD  . Hypertension   . BPH (benign prostatic hyperplasia)   . Chronic a-fib   . Old MI (myocardial infarction)   . ED (erectile dysfunction)     Past Surgical History  Procedure Laterality Date  . I&d extremity Right 06/18/2014    Procedure: OPEN TREATMENT RIGHT THUMB, INCOMPLETE AMPUTATION.  Washing and dressing applied to left thumb.;  Surgeon: Mack Hook, MD;  Location[facility] OR;  Service: Orthopedics;  Laterality: Right;     Current Outpatient Prescriptions  Medication Sig Dispense Refill  . amLODipine (NORVASC) 5 MG tablet Take 5 mg by mouth daily.    Marland Kitchen aspirin 325 MG tablet Take 325 mg by mouth daily.    . benazepril (LOTENSIN) 20 MG tablet Take 20 mg by mouth daily.    Marland Kitchen glipiZIDE (GLUCOTROL) 5 MG  tablet Take 2.5 mg by mouth 2 (two) times daily before a meal.     . metFORMIN (GLUCOPHAGE) 1000 MG tablet Take 1,000 mg by mouth 2 (two) times daily with a meal.    . metoprolol (LOPRESSOR) 100 MG tablet Take 100 mg by mouth 2 (two) times daily.    . Multiple Vitamins-Minerals (MULTIVITAL PO) Take 1 tablet by mouth daily.    . nitroGLYCERIN (NITROSTAT) 0.4 MG SL tablet Place 0.4 mg under the tongue every 5 (five) minutes as needed for chest pain.    Marland Kitchen oxyCODONE-acetaminophen (ROXICET) 5-325 MG per tablet Take 1-2 tablets by mouth every 4 (four) hours as needed. 60 tablet 0  . simvastatin (ZOCOR) 40 MG tablet Take 40 mg by mouth every evening.     No current facility-administered medications for this visit.    Allergies:   Flomax    Social History:  The patient  reports that he has quit smoking. He has never used smokeless tobacco. He reports that he does not drink alcohol or use illicit drugs.   Family History:  The patient's family history includes Aneurysm in his father; CVA in his mother; Heart attack in his maternal aunt; Heart disease in his brother, brother, and maternal aunt.    ROS:  Please see the history of present illness.   Otherwise, review of systems are  positive for shortness of breath, occasional chest pressure, and chest tightness..   All other systems are reviewed and negative.    PHYSICAL EXAM: VS:  BP 154/86 mmHg  Pulse 104  Ht 6\' 3"  (1.905 m)  Wt 224 lb 1.9 oz (101.66 kg)  BMI 28.01 kg/m2  SpO2 96% , BMI Body mass index is 28.01 kg/(m^2). GEN: Well nourished, well developed, in no acute distress HEENT: normal Neck: no JVD, carotid bruits, or masses Cardiac: IIRR; no murmurs, rubs, or gallops,no edema  Respiratory:  clear to auscultation bilaterally, normal work of breathing GI: soft, nontender, nondistended, + BS MS: no deformity or atrophy Skin: warm and dry, no rash Neuro:  Strength and sensation are intact Psych: euthymic mood, full affect   EKG:  EKG  is not ordered today.    Recent Labs: 05/29/2014: Pro B Natriuretic peptide (BNP) 265.0* 06/18/2014: BUN 16; Creatinine 0.98; Hemoglobin 15.0; Platelets 120*; Potassium 4.6; Sodium 135    Lipid Panel No results found for: CHOL, TRIG, HDL, CHOLHDL, VLDL, LDLCALC, LDLDIRECT    Wt Readings from Last 3 Encounters:  07/12/14 224 lb 1.9 oz (101.66 kg)  05/29/14 230 lb 12.8 oz (104.69 kg)      Other studies Reviewed: Additional studies/ records that were reviewed today include: . Review of the above records demonstrates:   ASSESSMENT AND PLAN:  Chronic atrial fibrillation: borderline rate control  Coronary artery disease involving native coronary artery of native heart with other form of angina pectoris  Dyspnea on exertion: The cause is uncertain. 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 His BNP is mildly elevated. LV systolic function is normal. Progression of coronary disease or pulmonary hypertension could be the explanation for the RVH.  Type 2 diabetes mellitus with other circulatory complications     Current medicines are reviewed at length with the patient today.  The patient does not have concerns regarding medicines.  The following changes have been made:  no change  After considering the patient's complaints and the findings as noted above, have decided that right and left heart catheterization with coronary angiography is needed to rule out pulmonary hypertension (mild by echo and in a range that would not cause right ventricular hypertrophy) to exclude his heart as a pertussis up and in his chronic complaint of dyspnea. The CT scan did not refill interstitial lung disease or pulmonary emboli. Risk of stroke, death, myocardial infarction, bleeding, infection, among others were discussed in detail with the patient and accepted.  Labs/ tests ordered today include:  No orders of the defined types were placed in this encounter.     Disposition:   FU with HS in PRN after heart  cath    Signed, Lesleigh NoeSMITH III,Venezia Sargeant W, MD  07/12/2014 12:14 PM    [facility] [address], [place], [place]  [address] Phone: [phone]; Fax: [phone]

## 2014-07-24 NOTE — Interval H&P Note (Signed)
Cath Lab Visit (complete for each Cath Lab visit)  Clinical Evaluation Leading to the Procedure:   ACS: No.  Non-ACS:    Anginal Classification: CCS III  Anti-ischemic medical therapy: Maximal Therapy (2 or more classes of medications)  Non-Invasive Test Results: No non-invasive testing performed  Prior CABG: No previous CABG      History and Physical Interval Note:  07/24/2014 9:29 AM  Eric Gallagher  has presented today for surgery, with the diagnosis of rule out pulmonary hypertension, sob  The various methods of treatment have been discussed with the patient and family. After consideration of risks, benefits and other options for treatment, the patient has consented to  Procedure(s): Right/Left Heart Cath and Coronary Angiography (N/A) as a surgical intervention .  The patient's history has been reviewed, patient examined, no change in status, stable for surgery.  I have reviewed the patient's chart and labs.  Questions were answered to the patient's satisfaction.     Lesleigh Noe

## 2014-07-24 NOTE — Progress Notes (Signed)
Site area: right brachial a 5 french arterial sheath was removed  Site Prior to Removal:  Level 0  Pressure Applied For 15 MINUTES    Minutes Beginning at 1040a  Manual:   Yes.    Patient Status During Pull:  stable  Post Pull Groin Site:  Level 0  Post Pull Instructions Given:  Yes.    Post Pull Pulses Present:  Yes.    Dressing Applied:  Yes.    Comments:  VS remain stable during sheath .  Pt denies any discomfort at this time.

## 2014-07-25 ENCOUNTER — Encounter (HOSPITAL_COMMUNITY): Payer: Self-pay | Admitting: Interventional Cardiology

## 2014-07-25 LAB — POCT I-STAT 3, VENOUS BLOOD GAS (G3P V)
ACID-BASE DEFICIT: 1 mmol/L (ref 0.0–2.0)
BICARBONATE: 25.3 meq/L — AB (ref 20.0–24.0)
O2 Saturation: 60 %
PO2 VEN: 33 mmHg (ref 30.0–45.0)
TCO2: 27 mmol/L (ref 0–100)
pCO2, Ven: 47.2 mmHg (ref 45.0–50.0)
pH, Ven: 7.337 — ABNORMAL HIGH (ref 7.250–7.300)

## 2014-07-25 LAB — POCT I-STAT 3, ART BLOOD GAS (G3+)
ACID-BASE DEFICIT: 2 mmol/L (ref 0.0–2.0)
BICARBONATE: 22.4 meq/L (ref 20.0–24.0)
O2 Saturation: 93 %
PCO2 ART: 37.6 mmHg (ref 35.0–45.0)
TCO2: 24 mmol/L (ref 0–100)
pH, Arterial: 7.383 (ref 7.350–7.450)
pO2, Arterial: 70 mmHg — ABNORMAL LOW (ref 80.0–100.0)

## 2014-07-25 MED FILL — Lidocaine HCl Local Preservative Free (PF) Inj 1%: INTRAMUSCULAR | Qty: 30 | Status: AC

## 2014-07-25 MED FILL — Heparin Sodium (Porcine) 2 Unit/ML in Sodium Chloride 0.9%: INTRAMUSCULAR | Qty: 1500 | Status: AC

## 2014-11-09 ENCOUNTER — Ambulatory Visit: Payer: Medicare Other | Admitting: Neurology

## 2015-02-10 DIAGNOSIS — I639 Cerebral infarction, unspecified: Secondary | ICD-10-CM

## 2015-02-10 HISTORY — DX: Cerebral infarction, unspecified: I63.9

## 2016-05-08 IMAGING — CR DG HAND COMPLETE 3+V*R*
3 series · 3 of 3 positions shown · non-contrast
Comparison: None.

CLINICAL DATA: Thumb caught in metal press

EXAM:
RIGHT HAND - COMPLETE 3+ VIEW

[x hand pa right]
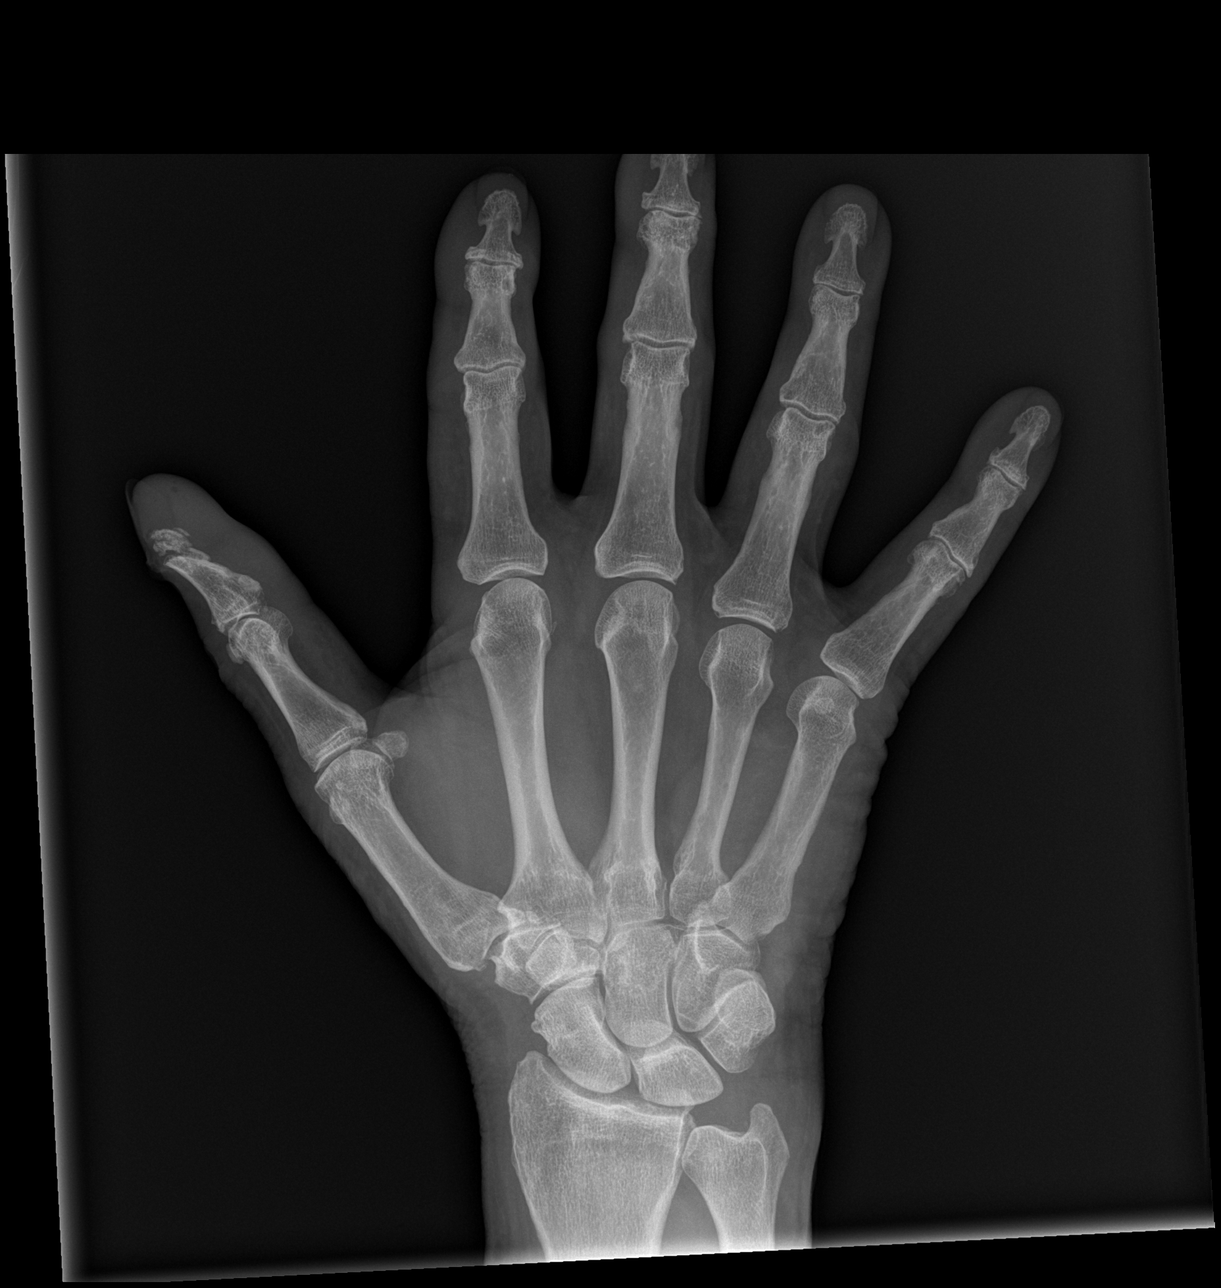

[x hand obl right]
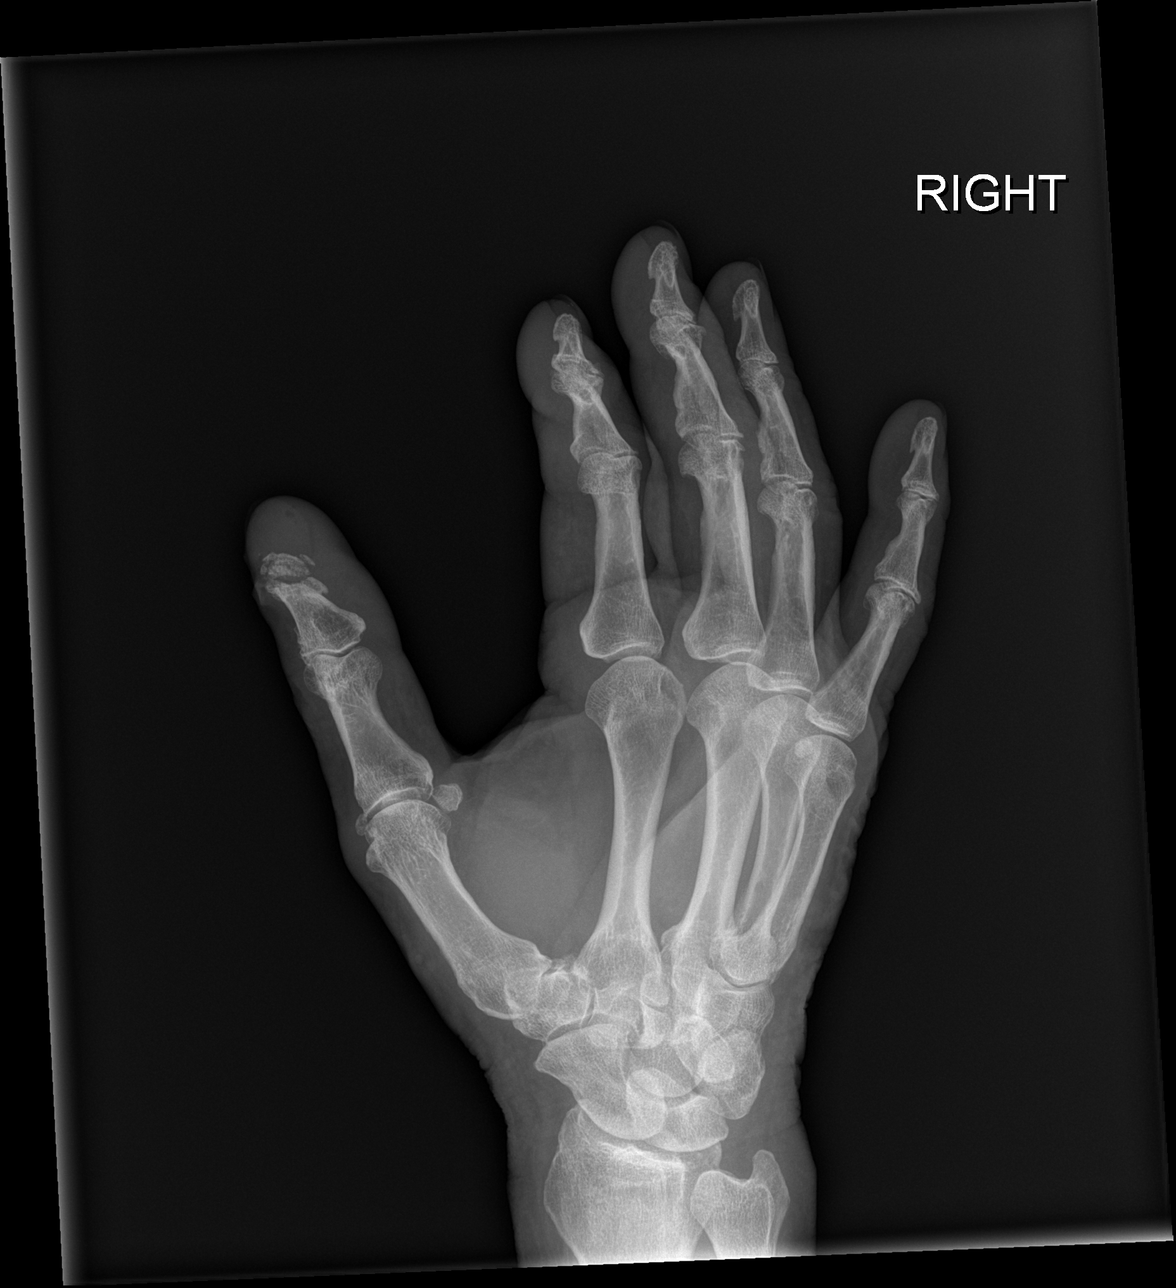

[x hand lat right]
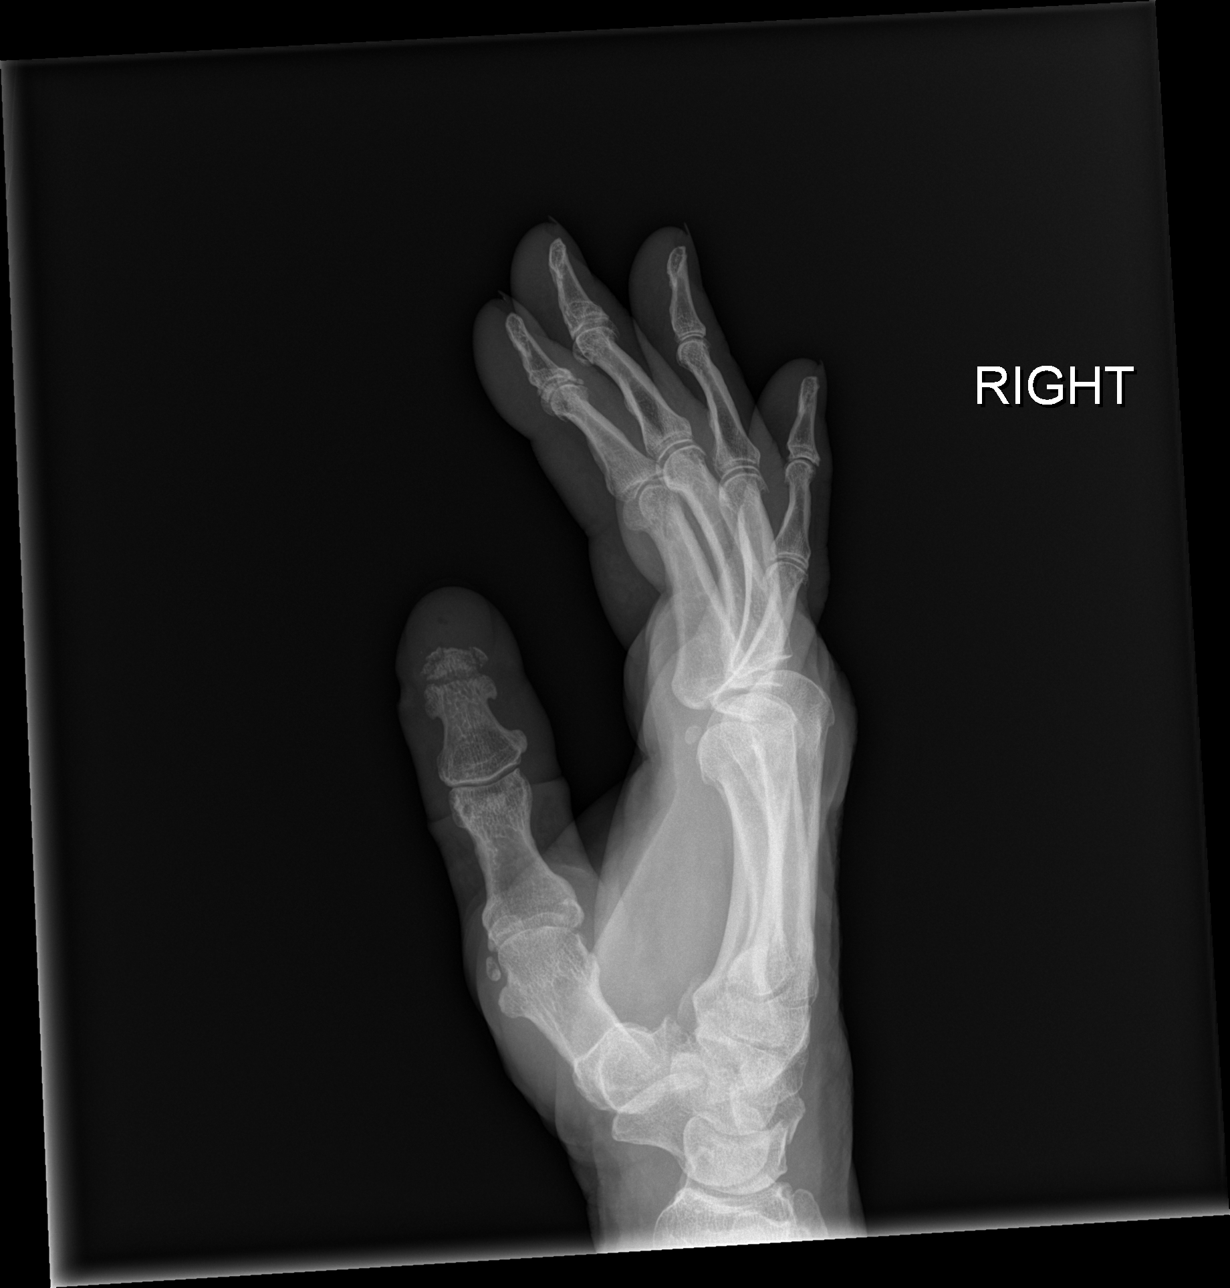

[3 of 3 positions shown; findings below may reference images not displayed]

FINDINGS: Frontal, oblique and lateral views were obtained. There is a
comminuted fracture of the distal aspect of the first distal phalanx
with mildly displaced fracture fragments. There is disruption at the
inferior ungual region. No other fractures are apparent. No
dislocation. There is osteoarthritic change in all PIP and DIP
joints. There is also osteoarthritic change in the scaphotrapezial
and saddle joints. No erosive change.
IMPRESSION: Comminuted fracture distal aspect first distal phalanx with
associated soft tissue/ ungual injury. No other fractures. No
dislocation. Multiple areas of osteoarthritic change.

## 2016-05-08 IMAGING — CR DG HAND COMPLETE 3+V*L*
3 series · 3 of 3 positions shown · non-contrast
Comparison: None.

CLINICAL DATA: Thumbs caught in metal press

EXAM:
LEFT HAND - COMPLETE 3+ VIEW

[x hand pa left]
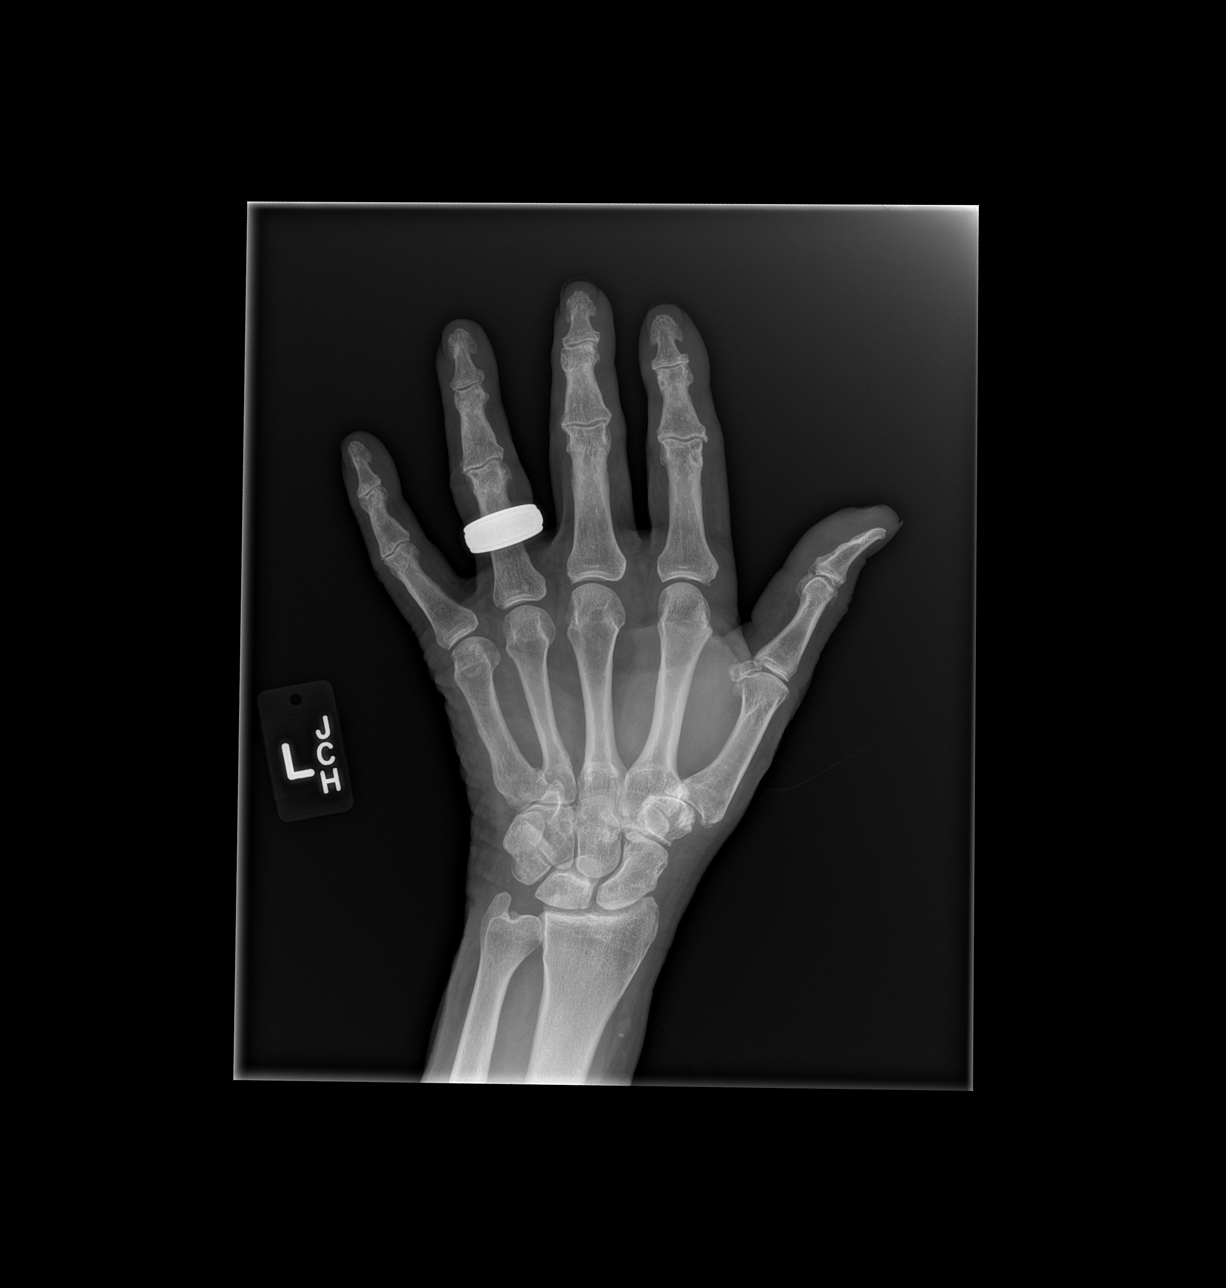

[x hand obl left]
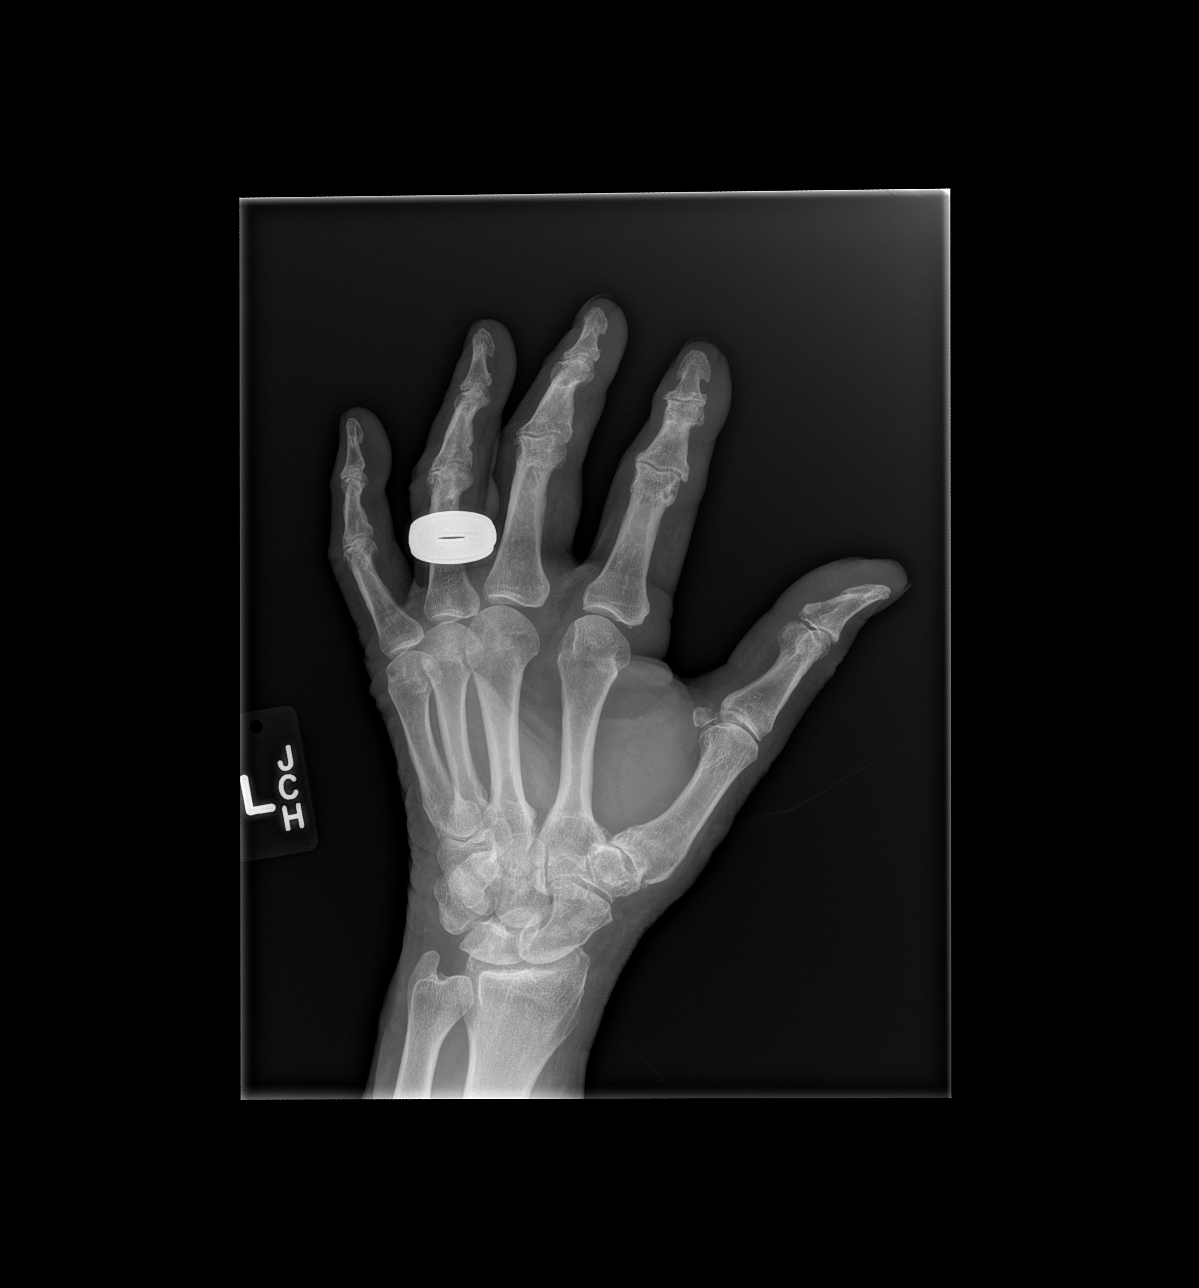

[x hand lat left]
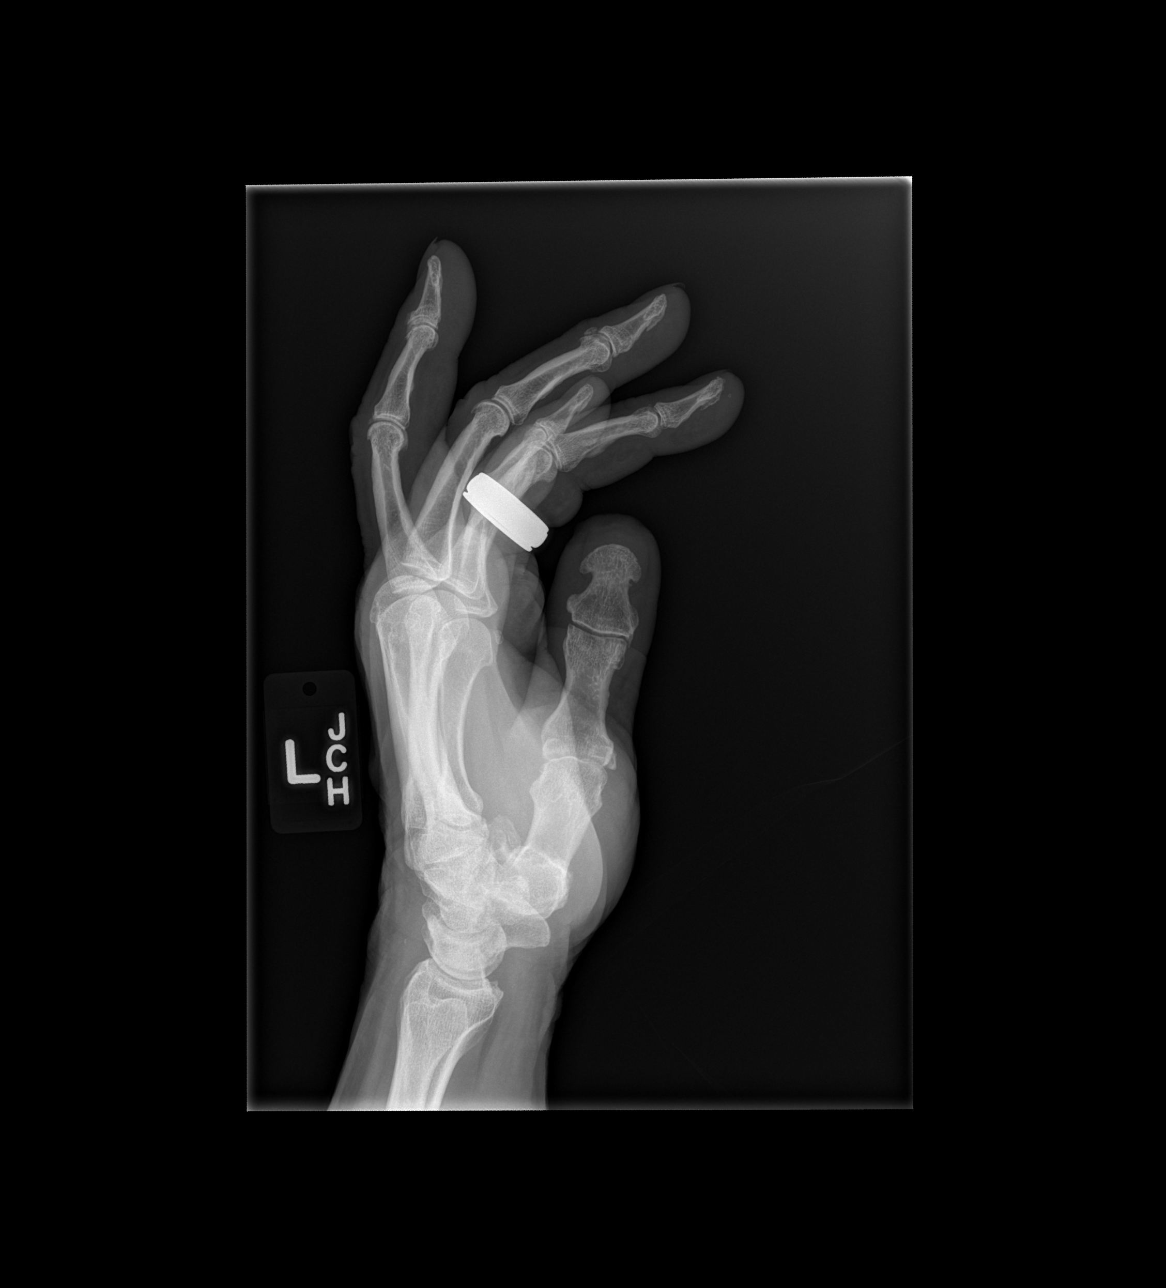

[3 of 3 positions shown; findings below may reference images not displayed]

FINDINGS: Frontal, oblique, and lateral views obtained. There is soft tissue
injury to the volar aspect of the distal portion of the first distal
phalanx. There is no acute fracture or dislocation. There is
osteoarthritic change in all PIP and DIP joints as well as in the
scaphotrapezial and saddle joints. No erosive change.
IMPRESSION: Soft tissue injury along the volar aspect of the distal portion of
first digit. No acute fracture or dislocation. Multifocal
osteoarthritic change.

## 2016-06-11 ENCOUNTER — Ambulatory Visit (INDEPENDENT_AMBULATORY_CARE_PROVIDER_SITE_OTHER): Payer: Medicare Other | Admitting: Neurology

## 2016-06-11 ENCOUNTER — Encounter: Payer: Self-pay | Admitting: Neurology

## 2016-06-11 VITALS — BP 120/72 | HR 76 | Temp 97.5°F | Ht 75.0 in | Wt 234.0 lb

## 2016-06-11 DIAGNOSIS — Z8673 Personal history of transient ischemic attack (TIA), and cerebral infarction without residual deficits: Secondary | ICD-10-CM

## 2016-06-11 DIAGNOSIS — E1142 Type 2 diabetes mellitus with diabetic polyneuropathy: Secondary | ICD-10-CM | POA: Diagnosis not present

## 2016-06-11 DIAGNOSIS — H8110 Benign paroxysmal vertigo, unspecified ear: Secondary | ICD-10-CM | POA: Diagnosis not present

## 2016-06-11 NOTE — Progress Notes (Signed)
NEUROLOGY CONSULTATION NOTE  Eric Gallagher MRN: [id number] DOB: 1940/08/19  Referring provider: Dr. Donette Larry Primary care provider: Dr. Donette Larry  Reason for consult:  dizziness  AUM CAGGIANO is a 76 year old right-handed male with CAD status post NSTEM , Taxus DES LAD, chronic atrial fibrillation, chronic thrombocytopenia, hyperlipidemia, type 2 diabetes, and residual left sided weakness secondary to stroke from January 2017 who presents for dizziness.  He had a stroke in January 2017 (admitted to [facility]), causing left sided weakness and numbness.  The weakness has improved, however he has had episodic dizziness since the stroke.  It is positional.  Whenever he bends over or then quickly straightens up from a bent position, he develops a spinning sensation.  It lasts less than a minute.  There is no associated double vision, nausea, vomiting, headache or unilateral numbness or weakness.  He has baseline hearing loss.  He has never been treated for it.    Workup in the past for dizziness includes an MRI of the brain from 04/03/04, which was personally reviewed and revealed chronic small vessel ischemic changes.  PAST MEDICAL HISTORY: Past Medical History:  Diagnosis Date  . Arrhythmia    Chronic AFIB, no anti-coag patient desire and low platelet count  . BPH (benign prostatic hyperplasia)   . Chronic a-fib (HCC)   . Coronary artery disease    NSTEMI in 2005, Taxus DES LAD  . Diabetes mellitus without complication (HCC)    type II, some neuropathy  . ED (erectile dysfunction)   . Hyperlipidemia   . Hypertension   . Old MI (myocardial infarction)   . Thrombocytopenia (HCC)    chronic    PAST SURGICAL HISTORY: Past Surgical History:  Procedure Laterality Date  . CARDIAC CATHETERIZATION N/A 07/24/2014   Procedure: Right/Left Heart Cath and Coronary Angiography;  Surgeon: Lyn Records, MD;  Location: [facility] INVASIVE CV LAB;  Service: Cardiovascular;  Laterality: N/A;  . I&D  EXTREMITY Right 06/18/2014   Procedure: OPEN TREATMENT RIGHT THUMB, INCOMPLETE AMPUTATION.  Washing and dressing applied to left thumb.;  Surgeon: Mack Hook, MD;  Location: [facility] OR;  Service: Orthopedics;  Laterality: Right;    MEDICATIONS: Current Outpatient Prescriptions on File Prior to Visit  Medication Sig Dispense Refill  . amLODipine (NORVASC) 5 MG tablet Take 5 mg by mouth daily.    . benazepril (LOTENSIN) 20 MG tablet Take 20 mg by mouth daily.    Marland Kitchen glipiZIDE (GLUCOTROL) 5 MG tablet Take 2.5 mg by mouth 2 (two) times daily before a meal.     . metFORMIN (GLUCOPHAGE) 1000 MG tablet Take 1,000 mg by mouth 2 (two) times daily with a meal.    . metoprolol (LOPRESSOR) 100 MG tablet Take 100 mg by mouth 2 (two) times daily.    . Multiple Vitamins-Minerals (MULTIVITAL PO) Take 1 tablet by mouth daily.    . nitroGLYCERIN (NITROSTAT) 0.4 MG SL tablet Place 0.4 mg under the tongue every 5 (five) minutes as needed for chest pain.    . simvastatin (ZOCOR) 40 MG tablet Take 40 mg by mouth every evening.     No current facility-administered medications on file prior to visit.     ALLERGIES: Allergies  Allergen Reactions  . Flomax [Tamsulosin Hcl] Other (See Comments)    Dizzy    FAMILY HISTORY: Family History  Problem Relation Age of Onset  . CVA Mother   . Aneurysm Father   . Heart disease Brother   . Heart disease  Brother   . Heart attack Maternal Aunt   . Heart disease Maternal Aunt     SOCIAL HISTORY: Social History   Social History  . Marital status: Married    Spouse name: N/A  . Number of children: N/A  . Years of education: N/A   Occupational History  . Not on file.   Social History Main Topics  . Smoking status: Former Games developermoker  . Smokeless tobacco: Never Used  . Alcohol use No  . Drug use: No  . Sexual activity: Not on file   Other Topics Concern  . Not on file   Social History Narrative  . No narrative on file    REVIEW OF SYSTEMS: Constitutional:  No fevers, chills, or sweats, no generalized fatigue, change in appetite Eyes: No visual changes, double vision, eye pain Ear, nose and throat: No hearing loss, ear pain, nasal congestion, sore throat Cardiovascular: No chest pain, palpitations Respiratory:  No shortness of breath at rest or with exertion, wheezes GastrointestinaI: No nausea, vomiting, diarrhea, abdominal pain, fecal incontinence Genitourinary:  No dysuria, urinary retention or frequency Musculoskeletal:  No neck pain, back pain Integumentary: No rash, pruritus, skin lesions Neurological: as above Psychiatric: No depression, insomnia, anxiety Endocrine: No palpitations, fatigue, diaphoresis, mood swings, change in appetite, change in weight, increased thirst Hematologic/Lymphatic:  No purpura, petechiae. Allergic/Immunologic: no itchy/runny eyes, nasal congestion, recent allergic reactions, rashes  PHYSICAL EXAM: Vitals:   06/11/16 1008  BP: 120/72  Pulse: 76  Temp: 97.5 F (36.4 C)   General: No acute distress.  Patient appears well-groomed.  Head:  Normocephalic/atraumatic Eyes:  fundi examined but not visualized Neck: supple, no paraspinal tenderness, full range of motion Back: No paraspinal tenderness Heart: regular rate and rhythm Lungs: Clear to auscultation bilaterally. Vascular: No carotid bruits. Neurological Exam: Mental status: alert and oriented to person, place, and time, recent and remote memory intact, fund of knowledge intact, attention and concentration intact, speech fluent and not dysarthric, language intact. Cranial nerves: CN I: not tested CN II: pupils equal, round and reactive to light, visual fields intact CN III, IV, VI:  full range of motion, no nystagmus, no ptosis CN V: facial sensation intact CN VII: upper and lower face symmetric CN VIII: hearing reduced bilaterally CN IX, X: gag intact, uvula midline CN XI: sternocleidomastoid and trapezius muscles intact CN XII: tongue  midline Bulk & Tone: normal, no fasciculations. Motor:  5/5 throughout  Sensation:  Pinprick reduced on bottom of feet and vibration sensation reduced in feet. Deep Tendon Reflexes:  2+ throughout except absent in ankles, toes downgoing.  Finger to nose testing:  Without dysmetria.  Heel to shin:  Without dysmetria. Gait:  Wide-based gait.  Requires several steps to turn, unable to tandem walk. Romberg negative.  IMPRESSION: 1.  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  They are purely positional, brief in duration and not associated with lateralizing symptoms.  I do not suspect they are cerebrovascular events. 2.  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  Will obtain records from his hospitalization at [facility] for stroke and review the notes and test results.  I will contact him if I have further recommendations based on the tests.  Otherwise, consider vestibular rehabilitation 2.  Continue secondary stroke prevention 3.  Follow up as needed.  Thank you for allowing me to take part in the care of this patient.  Shon MilletAdam Jaivion Kingsley, DO  CC: Georgann HousekeeperKarrar Husain, MD

## 2016-06-11 NOTE — Patient Instructions (Signed)
I think the dizziness is likely an inner ear issue.  I don't think new strokes are causing it.  I want to review the records from the hospital and if there is anything else we need to test, we will contact you. Otherwise, I recommend physical therapy for dizziness.

## 2016-06-24 NOTE — Progress Notes (Signed)
Cardiology Office Note    Date:  06/25/2016   ID:  Eric Gallagher, DOB 11-15-1940, MRN [id number]  PCP:  Georgann Housekeeper, MD  Cardiologist: Dr. Verdis Prime  Chief Complaint  Patient presents with  . Shortness of Breath    History of Present Illness:  Eric Gallagher is a 76 y.o. male with history of CAD status post DES to the LAD 2005, chronic atrial fibrillation hypertension, diabetes, chronic thrombocytopenia and chronic dyspnea and fatigue with normal LV function in the past but RVH on echo. Last cath 07/2014 90% third left ventricular branch of the RCA and 50-70% obtuse marginal, 55% proximal LAD, normal systolic function no evidence of pulmonary hypertension. Hasn't seen Dr. Katrinka Blazing since 2016.  History of CVA 02/2015 at [facility] with left-sided weakness and numbness which has resolved.  Patient referred back to [place] by Dr. Donette Larry for worsening Dyspnea on exertion. Patient lives alone and still drives. He got lost coming here and can't fill out forms b/c he's illiterate.This has been out of proportion to his cardiac workup in the past. He complains of 6 month history of worsening dyspnea on exertion. He can walk 100 feet and feels like he completely gives out and he can't lift his feet to walk any further.He becomes short of breath and has to rest. Denies any chest pain but says he never had any when he had his stent. Also complains of dizziness when bending over which has been a problem since his CVA. Saw Dr. Everlena Cooper, neurology 06/11/16 and diagnosed with benign positional vertigo. Dr. Donette Larry drew blood 5/15 but we don't have results.  Past Medical History:  Diagnosis Date  . Arrhythmia    Chronic AFIB, no anti-coag patient desire and low platelet count  . BPH (benign prostatic hyperplasia)   . Chronic a-fib (HCC)   . Coronary artery disease    NSTEMI in 2005, Taxus DES LAD  . Diabetes mellitus without complication (HCC)    type II, some neuropathy  . ED (erectile dysfunction)   .  Hyperlipidemia   . Hypertension   . Old MI (myocardial infarction)   . Thrombocytopenia (HCC)    chronic    Past Surgical History:  Procedure Laterality Date  . CARDIAC CATHETERIZATION N/A 07/24/2014   Procedure: Right/Left Heart Cath and Coronary Angiography;  Surgeon: Lyn Records, MD;  [facility] INVAS[facility] CV LAB;  Service: Cardiovascular;  Laterality: N/A;  . I&D EXTREMITY Right 06/18/2014   Procedure: OPEN TREATMENT RIGHT THUMB, INCOMPLETE AMPUTATION.  Washing and dressing applied to left thumb.;  Surgeon: Mack Hook, MD;  Location: [facility] OR;  Service: Orthopedics;  Laterality: Right;    Current Medications: Outpatient Medications Prior to Visit  Medication Sig Dispense Refill  . ACCU-CHEK AVIVA PLUS test strip     . ACCU-CHEK SOFTCLIX LANCETS lancets     . alfuzosin (UROXATRAL) 10 MG 24 hr tablet     . amLODipine (NORVASC) 5 MG tablet Take 5 mg by mouth daily.    . benazepril (LOTENSIN) 20 MG tablet Take 20 mg by mouth daily.    Marland Kitchen glipiZIDE (GLUCOTROL) 5 MG tablet Take 2.5 mg by mouth 2 (two) times daily before a meal.     . metFORMIN (GLUCOPHAGE) 1000 MG tablet Take 1,000 mg by mouth 2 (two) times daily with a meal.    . metoprolol (LOPRESSOR) 100 MG tablet Take 100 mg by mouth 2 (two) times daily.    . Multiple Vitamins-Minerals (MULTIVITAL PO) Take 1 tablet  by mouth daily.    . Naproxen Sodium (ALEVE PO) Take by mouth as needed.    . nitroGLYCERIN (NITROSTAT) 0.4 MG SL tablet Place 0.4 mg under the tongue every 5 (five) minutes as needed for chest pain.    . simvastatin (ZOCOR) 40 MG tablet Take 40 mg by mouth every evening.    . warfarin (COUMADIN) 5 MG tablet Take 5 mg by mouth daily.     No facility-administered medications prior to visit.      Allergies:   Flomax [tamsulosin hcl]   Social History   Social History  . Marital status: Married    Spouse name: N/A  . Number of children: N/A  . Years of education: N/A   Social History Main Topics  . Smoking  status: Former Games developermoker  . Smokeless tobacco: Never Used  . Alcohol use No  . Drug use: No  . Sexual activity: Not Asked   Other Topics Concern  . None   Social History Narrative  . None     Family History:  The patient's   family history includes Aneurysm in his father; CVA in his mother; Heart attack in his maternal aunt; Heart disease in his brother, brother, and maternal aunt.   ROS:   Please see the history of present illness.    Review of Systems  Constitution: Positive for weakness and malaise/fatigue.  HENT: Negative.   Cardiovascular: Positive for dyspnea on exertion.  Respiratory: Negative.   Endocrine: Negative.   Hematologic/Lymphatic: Negative.   Musculoskeletal: Positive for muscle weakness.  Gastrointestinal: Negative.   Genitourinary: Negative.   Neurological: Positive for dizziness.   All other systems reviewed and are negative.   PHYSICAL EXAM:   VS:  BP (!) 154/96   Pulse 86   Ht 6\' 3"  (1.905 m)   Wt 233 lb (105.7 kg)   SpO2 97%   BMI 29.12 kg/m   Physical Exam  GEN: Well nourished, well developed, in no acute distress  Neck: no JVD, carotid bruits, or masses Cardiac:irreg irreg 1/6 sys murmur LSB,no rubs, or gallops  Respiratory:  clear to auscultation bilaterally, normal work of breathing GI: soft, nontender, nondistended, + BS Ext: variscosities without cyanosis, clubbing, or edema, Good distal pulses bilaterally Neuro:  Alert and Oriented x 3 Psych: euthymic mood, full affect  Wt Readings from Last 3 Encounters:  06/25/16 233 lb (105.7 kg)  06/11/16 234 lb (106.1 kg)  07/24/14 224 lb (101.6 kg)      Studies/Labs Reviewed:   EKG:  EKG is  ordered today.  The ekg ordered today demonstrates atrial fib at 76/m  Recent Labs: No results found for requested labs within last 8760 hours.   Lipid Panel No results found for: CHOL, TRIG, HDL, CHOLHDL, VLDL, LDLCALC, LDLDIRECT  Additional studies/ records that were reviewed today include:    2-D echo 07/2014  - Left ventricle: The cavity size was normal. There was mild   concentric hypertrophy. - Left atrium: The atrium was severely dilated. - Right ventricle: Wall thickness was moderately to severely   increased. Systolic function was mildly reduced. - Right atrium: The atrium was severely dilated. - Tricuspid valve: There was mild-moderate regurgitation directed   centrally. - Pulmonary arteries: PA peak pressure: 37 mm Hg (S).   Right/left cardiac catheterization 07/2014 Angiography  Conclusion  1. 1st RPLB lesion, 90% stenosed. 2. Prox RCA to Dist RCA lesion, 30% stenosed. 3. Ost RCA lesion, 40% stenosed. 4. Dist Cx lesion, 70% stenosed. 5. Prox  Cx to Dist Cx lesion, 50% stenosed. 6. Prox LAD to Mid LAD lesion, 55% stenosed. 7. Ost LAD to Prox LAD lesion, 55% stenosed.    90% third left ventricular branch of the right coronary artery and 50-70% obtuse marginal. Proximal LAD contains eccentric 55% narrowing  Normal left ventricular systolic function with normal hemodynamics  No evidence of pulmonary hypertension     Recommendations:   Because of significant desaturation after sedation it is felt that a sleep study is indicated and may explain the patient's fatigue and dyspnea.       ASSESSMENT:    1. Dyspnea on exertion   2. Chronic atrial fibrillation (HCC)   3. Coronary artery disease involving native coronary artery of native heart without angina pectoris   4. Cerebrovascular accident (CVA), unspecified mechanism (HCC)   5. HTN (hypertension), benign      PLAN:  In order of problems listed above:  Dyspnea on exertion worsened over the past 6 months.no pulmonary HTN on right heart cath 2016. No CHF on exam today. Will check 2Decho for LV and diastolic function. Also check Lexiscan to rule out ischemia. Patient says he never had chest pain.Follow-up with Dr. Katrinka Blazing in 6 weeks.  Chronic Atrial fibrillation controlled rate on metoprolol and  coumadin  CAD with cath 2016 with disease listed above. Check Lexiscan to rule out ischemia.  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 Blood pressure a primary care was 124/60. We'll not change medications today.  Medication Adjustments/Labs and Tests Ordered: Current medicines are reviewed at length with the patient today.  Concerns regarding medicines are outlined above.  Medication changes, Labs and Tests ordered today are listed in the Patient Instructions below. There are no Patient Instructions on file for this visit.   Elson Clan, PA-C  06/25/2016 10:21 AM    [facility] [address] [place], [place], [place]  [address] Phone: [phone]; Fax: [phone]

## 2016-06-25 ENCOUNTER — Encounter: Payer: Self-pay | Admitting: Physician Assistant

## 2016-06-25 ENCOUNTER — Ambulatory Visit (INDEPENDENT_AMBULATORY_CARE_PROVIDER_SITE_OTHER): Payer: Medicare Other | Admitting: Physician Assistant

## 2016-06-25 VITALS — BP 154/96 | HR 86 | Ht 75.0 in | Wt 233.0 lb

## 2016-06-25 DIAGNOSIS — I639 Cerebral infarction, unspecified: Secondary | ICD-10-CM

## 2016-06-25 DIAGNOSIS — I2510 Atherosclerotic heart disease of native coronary artery without angina pectoris: Secondary | ICD-10-CM

## 2016-06-25 DIAGNOSIS — I10 Essential (primary) hypertension: Secondary | ICD-10-CM

## 2016-06-25 DIAGNOSIS — R0609 Other forms of dyspnea: Secondary | ICD-10-CM | POA: Diagnosis not present

## 2016-06-25 DIAGNOSIS — I482 Chronic atrial fibrillation, unspecified: Secondary | ICD-10-CM

## 2016-06-25 NOTE — Patient Instructions (Signed)
Medication Instructions:  Your physician recommends that you continue on your current medications as directed. Please refer to the Current Medication list given to you today.   Labwork: -None  Testing/Procedures: Your physician has requested that you have a lexiscan myoview. For further information please visit [email or web address]. Please follow instruction sheet, as given.  Your physician has requested that you have an echocardiogram. Echocardiography is a painless test that uses sound waves to create images of your heart. It provides your doctor with information about the size and shape of your heart and how well your heart's chambers and valves are working. This procedure takes approximately one hour. There are no restrictions for this procedure.    Follow-Up: Your physician recommends that you keep your scheduled  follow-up appointment with Dr. Katrinka BlazingSmith.    Any Other Special Instructions Will Be Listed Below (If Applicable).     If you need a refill on your cardiac medications before your next appointment, please call your pharmacy.

## 2016-07-02 ENCOUNTER — Telehealth (HOSPITAL_COMMUNITY): Payer: Self-pay | Admitting: *Deleted

## 2016-07-02 NOTE — Telephone Encounter (Signed)
Patient given detailed instructions per Myocardial Perfusion Study Information Sheet for the test on 06/10/16. Patient notified to arrive 15 minutes early and that it is imperative to arrive on time for appointment to keep from having the test rescheduled.  If you need to cancel or reschedule your appointment, please call the office within 24 hours of your appointment. . Patient verbalized understanding. Ricky AlaSmith, Yacine Garriga Jacqueline

## 2016-07-07 ENCOUNTER — Ambulatory Visit (HOSPITAL_COMMUNITY): Payer: Medicare Other | Attending: Cardiovascular Disease

## 2016-07-07 DIAGNOSIS — I10 Essential (primary) hypertension: Secondary | ICD-10-CM | POA: Diagnosis not present

## 2016-07-07 DIAGNOSIS — I2510 Atherosclerotic heart disease of native coronary artery without angina pectoris: Secondary | ICD-10-CM | POA: Insufficient documentation

## 2016-07-07 DIAGNOSIS — E119 Type 2 diabetes mellitus without complications: Secondary | ICD-10-CM | POA: Insufficient documentation

## 2016-07-07 DIAGNOSIS — R0609 Other forms of dyspnea: Secondary | ICD-10-CM | POA: Diagnosis not present

## 2016-07-07 DIAGNOSIS — I4891 Unspecified atrial fibrillation: Secondary | ICD-10-CM | POA: Insufficient documentation

## 2016-07-07 DIAGNOSIS — Z8673 Personal history of transient ischemic attack (TIA), and cerebral infarction without residual deficits: Secondary | ICD-10-CM | POA: Insufficient documentation

## 2016-07-07 LAB — MYOCARDIAL PERFUSION IMAGING
CHL CUP NUCLEAR SRS: 3
LV dias vol: 108 mL (ref 62–150)
LV sys vol: 48 mL
NUC STRESS TID: 1.07
Peak HR: 96 {beats}/min
RATE: 0.35
Rest HR: 63 {beats}/min
SDS: 0
SSS: 3

## 2016-07-07 MED ORDER — REGADENOSON 0.4 MG/5ML IV SOLN
0.4000 mg | Freq: Once | INTRAVENOUS | Status: AC
  Administered 2016-07-07: 0.4 mg via INTRAVENOUS

## 2016-07-07 MED ORDER — TECHNETIUM TC 99M TETROFOSMIN IV KIT
32.7000 | PACK | Freq: Once | INTRAVENOUS | Status: AC | PRN
  Administered 2016-07-07: 32.7 via INTRAVENOUS
  Filled 2016-07-07: qty 33

## 2016-07-07 MED ORDER — TECHNETIUM TC 99M TETROFOSMIN IV KIT
10.5000 | PACK | Freq: Once | INTRAVENOUS | Status: AC | PRN
  Administered 2016-07-07: 10.5 via INTRAVENOUS
  Filled 2016-07-07: qty 11

## 2016-07-10 ENCOUNTER — Other Ambulatory Visit: Payer: Self-pay

## 2016-07-10 ENCOUNTER — Ambulatory Visit (HOSPITAL_COMMUNITY): Payer: Medicare Other | Attending: Cardiovascular Disease

## 2016-07-10 DIAGNOSIS — R0609 Other forms of dyspnea: Secondary | ICD-10-CM | POA: Diagnosis not present

## 2016-07-30 ENCOUNTER — Encounter: Payer: Self-pay | Admitting: Interventional Cardiology

## 2016-08-16 NOTE — Progress Notes (Signed)
Cardiology Office Note    Date:  08/17/2016   ID:  Eric Gallagher, DOB 1940-06-10, MRN [id number]  PCP:  Georgann Housekeeper, MD  Cardiologist: Lesleigh Noe, MD   Chief Complaint  Patient presents with  . Dizziness    Orthostatic    History of Present Illness:  Eric Gallagher is a 76 y.o. male history of CAD status post DES to the LAD 2005 (cath 07/2014---> 90% third left ventricular branch of the RCA; 50-70% obtuse marginal; 55% proximal LAD)  , chronic atrial fibrillation, essential hypertension, diabetes mellitus type 2, chronic thrombocytopenia and chronic dyspnea and fatigue. Here to discuss work-up for dyspnea.   The patient has a history of CAD, had LAD stent placed remotely and for the last several years has been troubled by dizziness associated with stooping and bending. When he straightens up from either position he has near syncope. Also states that if he lies in his recliner watching TV at night when he gets up to go to bed he is extremely dizzy and near fainting.This problem has been going on for 3 years. It is slightly better now than when I last saw him.  He denies chest pain, orthopnea, palpitations, and true syncope.   Past Medical History:  Diagnosis Date  . Arrhythmia    Chronic AFIB, no anti-coag patient desire and low platelet count  . BPH (benign prostatic hyperplasia)   . Chronic a-fib (HCC)   . Coronary artery disease    NSTEMI in 2005, Taxus DES LAD  . Diabetes mellitus without complication (HCC)    type II, some neuropathy  . ED (erectile dysfunction)   . Hyperlipidemia   . Hypertension   . Old MI (myocardial infarction)   . Thrombocytopenia (HCC)    chronic    Past Surgical History:  Procedure Laterality Date  . CARDIAC CATHETERIZATION N/A 07/24/2014   Procedure: Right/Left Heart Cath and Coronary Angiography;  Surgeon: Lyn Records, MD;  Location: [facility] INVASIVE CV LAB;  Service: Cardiovascular;  Laterality: N/A;  . I&D EXTREMITY Right 06/18/2014   Procedure: OPEN TREATMENT RIGHT THUMB, INCOMPLETE AMPUTATION.  Washing and dressing applied to left thumb.;  Surgeon: Mack Hook, MD;  Location: [facility] OR;  Service: Orthopedics;  Laterality: Right;    Current Medications: Outpatient Medications Prior to Visit  Medication Sig Dispense Refill  . ACCU-CHEK AVIVA PLUS test strip     . ACCU-CHEK SOFTCLIX LANCETS lancets     . alfuzosin (UROXATRAL) 10 MG 24 hr tablet     . amLODipine (NORVASC) 5 MG tablet Take 5 mg by mouth daily.    . benazepril (LOTENSIN) 20 MG tablet Take 20 mg by mouth daily.    Marland Kitchen glipiZIDE (GLUCOTROL) 5 MG tablet Take 2.5 mg by mouth 2 (two) times daily before a meal.     . metFORMIN (GLUCOPHAGE) 1000 MG tablet Take 1,000 mg by mouth 2 (two) times daily with a meal.    . metoprolol (LOPRESSOR) 100 MG tablet Take 100 mg by mouth 2 (two) times daily.    . Multiple Vitamins-Minerals (MULTIVITAL PO) Take 1 tablet by mouth daily.    . Naproxen Sodium (ALEVE PO) Take by mouth as needed.    . nitroGLYCERIN (NITROSTAT) 0.4 MG SL tablet Place 0.4 mg under the tongue every 5 (five) minutes as needed for chest pain.    . simvastatin (ZOCOR) 40 MG tablet Take 40 mg by mouth every evening.    . warfarin (COUMADIN) 5 MG tablet  Take 5 mg by mouth daily.     No facility-administered medications prior to visit.      Allergies:   Flomax [tamsulosin hcl]   Social History   Social History  . Marital status: Married    Spouse name: N/A  . Number of children: N/A  . Years of education: N/A   Social History Main Topics  . Smoking status: Former Games developermoker  . Smokeless tobacco: Never Used  . Alcohol use No  . Drug use: No  . Sexual activity: Not Asked   Other Topics Concern  . None   Social History Narrative  . None     Family History:  The patient's family history includes Aneurysm in his father; CVA in his mother; Heart attack in his maternal aunt; Heart disease in his brother, brother, and maternal aunt.   ROS:   Please see  the history of present illness.    No chest pain or other complaints.  All other systems reviewed and are negative.   PHYSICAL EXAM:   VS:  BP (!) 154/96 (BP Location: Left Arm)   Pulse 87   Ht 6\' 3"  (1.905 m)   Wt 228 lb (103.4 kg)   BMI 28.50 kg/m    GEN: Well nourished, well developed, in no acute distress  HEENT: normal  Neck: no JVD, carotid bruits, or masses Cardiac:IIRR; no murmurs, rubs, or gallops. There is no edema. There are marked/bulbous varicosities left lower extremity greater than right. Respiratory:  clear to auscultation bilaterally, normal work of breathing GI: soft, nontender, nondistended, + BS MS: no deformity or atrophy  Skin: warm and dry, no rash Neuro:  Alert and Oriented x 3, Strength and sensation are intact Psych: euthymic mood, full affect  Wt Readings from Last 3 Encounters:  08/17/16 228 lb (103.4 kg)  07/07/16 233 lb (105.7 kg)  06/25/16 233 lb (105.7 kg)      Studies/Labs Reviewed:   EKG:  EKG  Atrial fibrillation with controlled rate and nonspecific ST abnormality.  Recent Labs: No results found for requested labs within last 8760 hours.   Lipid Panel No results found for: CHOL, TRIG, HDL, CHOLHDL, VLDL, LDLCALC, LDLDIRECT  Additional studies/ records that were reviewed today include:   ECHOCARDIOGRAM 07/2016: Study Conclusions  - Left ventricle: The cavity size was mildly dilated. Systolic   function was normal. The estimated ejection fraction was in the   range of 55% to 60%. Wall motion was normal; there were no   regional wall motion abnormalities. - Left atrium: The atrium was severely dilated. - Right ventricle: The cavity size was mildly dilated. Wall   thickness was normal. - Right atrium: The atrium was severely dilated. - Atrial septum: No defect or patent foramen ovale was identified. - Pulmonary arteries: PA peak pressure: 32 mm Hg (S).  STRESS TEST 06/2016:  Study Highlights     The left ventricular ejection  fraction is normal (55-65%).  Nuclear stress EF: 56%.  No T wave inversion was noted during stress.  There was no ST segment deviation noted during stress.  Defect 1: There is a small defect of mild severity.  This is a low risk study.   Small size, mild intensity fixed apical/apical lateral perfusion defect, likely artifact. No reversible ischemia. LVEF 56% with normal wall motion. This is a low risk study.      ASSESSMENT:    1. Orthostatic dizziness   2. Coronary artery disease involving native coronary artery of native heart with  angina pectoris (HCC)   3. Chronic atrial fibrillation (HCC)   4. HTN (hypertension), benign   5. Cerebrovascular accident (CVA), unspecified mechanism (HCC)   6. Type 2 diabetes mellitus with other circulatory complications (HCC)   7. Varicose veins of both legs with edema      PLAN:  In order of problems listed above:  1. Significant orthostatic blood pressure drop in conjunction with marked varicose vein raises a question of venous insufficiency as a contributing source to the patient's orthostatic dizziness. Blood pressure sitting 160/90 mmHg with heart rate of 100 bpm decreasing to 130/70 mmHg with heart rate of 88 bpm. Plan moderate tension knee-high support stockings. 2. No ischemic evaluation is indicated given the nature of the complaint. 3. Controlled rate. 4. Elevated blood pressure but with significant orthostasis. Sitting blood pressures will need to be liberalized to avoid excessive blood pressure drop with standing. Hopefully support stockings will lessen his symptoms. 5. Not evaluated. Carotid Doppler study performed 18 months ago revealed no obstruction greater than 50% in either carotid. 6. 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 7. Moderate tension knee-high support stockings to help abate symptoms related to venous pooling and orthostasis.  No further evaluation for orthostatic  dizziness other than as outlined. Hopefully compression stockings will be helpful. Otherwise clinical follow-up in one year  Medication Adjustments/Labs and Tests Ordered: Current medicines are reviewed at length with the patient today.  Concerns regarding medicines are outlined above.  Medication changes, Labs and Tests ordered today are listed in the Patient Instructions below. Patient Instructions  Medication Instructions:  None  Labwork: None  Testing/Procedures: None  Follow-Up: Your physician wants you to follow-up in: 1 year with Dr. Katrinka Blazing. You will receive a reminder letter in the mail two months in advance. If you don't receive a letter, please call our office to schedule the follow-up appointment.   Any Other Special Instructions Will Be Listed Below (If Applicable).  Your physician recommends that you wear knee high compression stockings.   If you need a refill on your cardiac medications before your next appointment, please call your pharmacy.      Signed, Lesleigh Noe, MD  08/17/2016 4:56 PM    [facility] [address] [place], [place], [place]  [address] Phone: [phone]; Fax: [phone]

## 2016-08-17 ENCOUNTER — Encounter (INDEPENDENT_AMBULATORY_CARE_PROVIDER_SITE_OTHER): Payer: Self-pay

## 2016-08-17 ENCOUNTER — Ambulatory Visit (INDEPENDENT_AMBULATORY_CARE_PROVIDER_SITE_OTHER): Payer: Medicare Other | Admitting: Interventional Cardiology

## 2016-08-17 ENCOUNTER — Encounter: Payer: Self-pay | Admitting: Interventional Cardiology

## 2016-08-17 VITALS — BP 154/96 | HR 87 | Ht 75.0 in | Wt 228.0 lb

## 2016-08-17 DIAGNOSIS — I10 Essential (primary) hypertension: Secondary | ICD-10-CM

## 2016-08-17 DIAGNOSIS — I482 Chronic atrial fibrillation, unspecified: Secondary | ICD-10-CM

## 2016-08-17 DIAGNOSIS — I639 Cerebral infarction, unspecified: Secondary | ICD-10-CM | POA: Diagnosis not present

## 2016-08-17 DIAGNOSIS — I83893 Varicose veins of bilateral lower extremities with other complications: Secondary | ICD-10-CM

## 2016-08-17 DIAGNOSIS — R42 Dizziness and giddiness: Secondary | ICD-10-CM | POA: Diagnosis not present

## 2016-08-17 DIAGNOSIS — E1159 Type 2 diabetes mellitus with other circulatory complications: Secondary | ICD-10-CM

## 2016-08-17 DIAGNOSIS — I25119 Atherosclerotic heart disease of native coronary artery with unspecified angina pectoris: Secondary | ICD-10-CM

## 2016-08-17 NOTE — Patient Instructions (Signed)
Medication Instructions:  None  Labwork: None  Testing/Procedures: None  Follow-Up: Your physician wants you to follow-up in: 1 year with Dr. Katrinka BlazingSmith. You will receive a reminder letter in the mail two months in advance. If you don't receive a letter, please call our office to schedule the follow-up appointment.   Any Other Special Instructions Will Be Listed Below (If Applicable).  Your physician recommends that you wear knee high compression stockings.   If you need a refill on your cardiac medications before your next appointment, please call your pharmacy.

## 2017-07-29 ENCOUNTER — Other Ambulatory Visit: Payer: Self-pay | Admitting: Internal Medicine

## 2017-07-29 DIAGNOSIS — R0989 Other specified symptoms and signs involving the circulatory and respiratory systems: Secondary | ICD-10-CM

## 2017-08-05 ENCOUNTER — Ambulatory Visit
Admission: RE | Admit: 2017-08-05 | Discharge: 2017-08-05 | Disposition: A | Discharge status: Home / Self Care | Payer: Medicare Other | Source: Ambulatory Visit | Attending: Internal Medicine | Admitting: Internal Medicine

## 2017-08-05 DIAGNOSIS — R0989 Other specified symptoms and signs involving the circulatory and respiratory systems: Secondary | ICD-10-CM

## 2017-11-16 ENCOUNTER — Ambulatory Visit (INDEPENDENT_AMBULATORY_CARE_PROVIDER_SITE_OTHER): Payer: Self-pay

## 2017-11-16 ENCOUNTER — Ambulatory Visit (INDEPENDENT_AMBULATORY_CARE_PROVIDER_SITE_OTHER): Payer: Medicare Other | Admitting: Orthopaedic Surgery

## 2017-11-16 ENCOUNTER — Telehealth: Payer: Self-pay | Admitting: Nurse Practitioner

## 2017-11-16 ENCOUNTER — Encounter (INDEPENDENT_AMBULATORY_CARE_PROVIDER_SITE_OTHER): Payer: Self-pay | Admitting: Orthopaedic Surgery

## 2017-11-16 DIAGNOSIS — G8929 Other chronic pain: Secondary | ICD-10-CM | POA: Diagnosis not present

## 2017-11-16 DIAGNOSIS — M160 Bilateral primary osteoarthritis of hip: Secondary | ICD-10-CM | POA: Diagnosis not present

## 2017-11-16 DIAGNOSIS — M25562 Pain in left knee: Secondary | ICD-10-CM | POA: Diagnosis not present

## 2017-11-16 NOTE — Telephone Encounter (Signed)
   [facility] Pre-operative Risk Assessment    Request for surgical clearance:  1. What type of surgery is being performed? Right Total Hip Replacement   2. When is this surgery scheduled? TBD   3. What type of clearance is required (medical clearance vs. Pharmacy clearance to hold med vs. Both)?  Medical  4. Are there any medications that need to be held prior to surgery and how long?  Coumadin. Spoke with patient and advised him to contact Dr. Glenna Durand office for instructions regarding Coumadin  5. Practice name and name of physician performing surgery? [facility], Dr. Ninfa Linden   6. What is your office phone number [phone]    7.   What is your office fax number [phone]  8.   Anesthesia type (None, local, MAC, general) ?  Left message for Eric Gallagher, surgery  scheduler requesting call back    Eric Gallagher 11/16/2017, 3:47 PM  _________________________________________________________________   (provider comments below)

## 2017-11-16 NOTE — Progress Notes (Signed)
Office Visit Note   Patient: RAUDEL BAZEN           Date of Birth: December 17, 1940           MRN: [id number] Visit Date: 11/16/2017              Requested by: Georgann Housekeeper, MD [address] [place], [place] [address] PCP: Georgann Housekeeper, MD   Assessment & Plan: Visit Diagnoses:  1. Chronic pain of left knee   2. Primary osteoarthritis of both hips     Plan: Patient has significant right hip pain and left hip and knee pain that greatly affects his activities of daily living.  After reviewing the radiographs and patient's physical exam with him today recommend right total hip arthroplasty.  Did discuss conservative treatment which would include cortisone injections and both hips patient states that he would rather just have the hips fixed instead of placing cortisone in them.  He is diabetic reports good control of his diabetes.  He has a significant history for coronary artery disease and history of stroke he is on a Coumadin.  Therefore he is given a note saying that he needs clearance for surgery with his cardiologist and primary care physician.  Handout on total hip arthroplasty is given in the patient.  Risk including but not limited to complications of anesthesia, wound healing problems, PE/DVT, nerve vessel injury all discussed with the patient.  Questions encouraged and answered by Dr. Magnus Ivan and myself.  We will schedule him for right total hip arthroplasty once he has gained clearance.  Follow-Up Instructions: Return for Once he has medical clearance before total hip arthroplasty.   Orders:  Orders Placed This Encounter  Procedures  . XR Knee 1-2 Views Left  . XR HIP UNILAT W OR W/O PELVIS 1V RIGHT   No orders of the defined types were placed in this encounter.     Procedures: No procedures performed   Clinical Data: No additional findings.   Subjective: Chief Complaint  Patient presents with  . Left Knee - Pain  . Right Hip - Pain    HPI Mr.  Arie Sabina is a 77 year old male comes in today with left hip and knee pain and right hip pain.  He states he has had pain in the right hip for several months to a year.  Denies any groin pain left hip has right groin pain.  He states his left knee bothers him at times and shoots up into his left hip.  He has had no known injury to either hip.  Pain in the hips and left knee keeps him from walking for prolonged periods.  He also states that he cannot lay on his right side due to the hip pain.  Feels his left leg is weak. Patient has a medical history that is significant for coronary artery disease with an MI some 9 years ago.  He also has diabetes which he reports is under good control.  History of stroke 1 year ago currently on Coumadin.  Also was recently seen cardiology this summer due to orthostatic dizziness was told to wear compression. Review of Systems Denies any recent chest pain shortness breath fevers or chills.  Objective: Vital Signs: There were no vitals taken for this visit.  Physical Exam  Constitutional: He is oriented to person, place, and time. He appears well-developed and well-nourished. No distress.  Pulmonary/Chest: Effort normal.  Neurological: He is alert and oriented to person, place, and time.  Skin: He is not diaphoretic.  Psychiatric: He has a normal mood and affect.    Ortho Exam Bilateral knees good range of motion without pain.  Left knee with patellofemoral crepitus.  No instability valgus varus stressing.  No tenderness along medial lateral joint line of either knee no effusion abnormal warmth erythema of either knee.  Calf supple nontender.  He has he has limited internal rotation of both hips and limited external rotation of both hips with pain. Specialty Comments:  No specialty comments available.  Imaging: Xr Hip Unilat W Or W/o Pelvis 1v Right  Result Date: 11/16/2017 AP pelvis and lateral view of the right hip shows end-stage osteoarthritis of the right  hip.  Near bone-on-bone left hip.  No acute fracture.  Both hips are well located.  Xr Knee 1-2 Views Left  Result Date: 11/16/2017 Left knee AP view lateral view: Shows some mild medial compartmental narrowing.  Mild to moderate patellofemoral changes.  Lateral compartment well-preserved.  Knee is well located.  No acute fractures.    PMFS History: Patient Active Problem List   Diagnosis Date Noted  . CVA (cerebral vascular accident) (HCC) 06/25/2016  . HTN (hypertension), benign 06/25/2016  . Dyspnea on exertion 05/29/2014  . Coronary artery disease involving native coronary artery of native heart with angina pectoris (HCC) 05/29/2014  . Erectile dysfunction due to arterial insufficiency 05/29/2014  . Type 2 diabetes mellitus with other circulatory complications (HCC) 05/29/2014  . Chronic atrial fibrillation 05/29/2014   Past Medical History:  Diagnosis Date  . Arrhythmia    Chronic AFIB, no anti-coag patient desire and low platelet count  . BPH (benign prostatic hyperplasia)   . Chronic a-fib (HCC)   . Coronary artery disease    NSTEMI in 2005, Taxus DES LAD  . Diabetes mellitus without complication (HCC)    type II, some neuropathy  . ED (erectile dysfunction)   . Hyperlipidemia   . Hypertension   . Old MI (myocardial infarction)   . Thrombocytopenia (HCC)    chronic    Family History  Problem Relation Age of Onset  . CVA Mother   . Aneurysm Father   . Heart disease Brother   . Heart disease Brother   . Heart attack Maternal Aunt   . Heart disease Maternal Aunt     Past Surgical History:  Procedure Laterality Date  . CARDIAC CATHETERIZATION N/A 07/24/2014   Procedure: Right/Left Heart Cath and Coronary Angiography;  Surgeon: Lyn Records, MD;  Location: [facility] INVASIVE CV LAB;  Service: Cardiovascular;  Laterality: N/A;  . I&D EXTREMITY Right 06/18/2014   Procedure: OPEN TREATMENT RIGHT THUMB, INCOMPLETE AMPUTATION.  Washing and dressing applied to left thumb.;   Surgeon: Mack Hook, MD;  Location: [facility] OR;  Service: Orthopedics;  Laterality: Right;   Social History   Occupational History  . Not on file  Tobacco Use  . Smoking status: Former Games developer  . Smokeless tobacco: Never Used  Substance and Sexual Activity  . Alcohol use: No    Alcohol/week: 0.0 standard drinks  . Drug use: No  . Sexual activity: Not on file

## 2017-11-19 NOTE — Telephone Encounter (Signed)
Dr. Katrinka Blazing I have call into pt but do you believe he is stable for Rt total hip replacement?  Last note appears stable.

## 2017-11-21 NOTE — Telephone Encounter (Signed)
Last seen > 15 months ago. He needs f/u to clear.

## 2017-11-22 NOTE — Telephone Encounter (Signed)
   Primary Cardiologist:No primary care provider on file.  Dr. Katrinka Blazing  Chart reviewed as part of pre-operative protocol coverage. Because of Eric Gallagher's past medical history and time since last visit, he/she will require a follow-up visit in order to better assess preoperative cardiovascular risk.  Pre-op covering staff: - Please schedule appointment and call patient to inform them. - Please contact requesting surgeon's office via preferred method (i.e, phone, fax) to inform them of need for appointment prior to surgery.  Nada Boozer, NP  11/22/2017, 8:32 AM

## 2017-11-22 NOTE — Telephone Encounter (Signed)
Pt has appt tomorrow to see Dr. Katrinka Blazing 11/23/17 for Pre Op clearance. I will remove from the Pre Op Call back pool .

## 2017-11-23 ENCOUNTER — Encounter: Payer: Self-pay | Admitting: Interventional Cardiology

## 2017-11-23 ENCOUNTER — Ambulatory Visit: Payer: Medicare Other | Admitting: Interventional Cardiology

## 2017-11-23 VITALS — BP 118/66 | HR 69 | Ht 75.0 in | Wt 229.8 lb

## 2017-11-23 DIAGNOSIS — I10 Essential (primary) hypertension: Secondary | ICD-10-CM | POA: Diagnosis not present

## 2017-11-23 DIAGNOSIS — I482 Chronic atrial fibrillation, unspecified: Secondary | ICD-10-CM

## 2017-11-23 DIAGNOSIS — Z01810 Encounter for preprocedural cardiovascular examination: Secondary | ICD-10-CM

## 2017-11-23 DIAGNOSIS — I639 Cerebral infarction, unspecified: Secondary | ICD-10-CM | POA: Diagnosis not present

## 2017-11-23 DIAGNOSIS — I25119 Atherosclerotic heart disease of native coronary artery with unspecified angina pectoris: Secondary | ICD-10-CM

## 2017-11-23 DIAGNOSIS — I2510 Atherosclerotic heart disease of native coronary artery without angina pectoris: Secondary | ICD-10-CM

## 2017-11-23 DIAGNOSIS — E1159 Type 2 diabetes mellitus with other circulatory complications: Secondary | ICD-10-CM

## 2017-11-23 NOTE — Patient Instructions (Signed)
Medication Instructions:  Your physician recommends that you continue on your current medications as directed. Please refer to the Current Medication list given to you today.  If you need a refill on your cardiac medications before your next appointment, please call your pharmacy.   Lab work: None If you have labs (blood work) drawn today and your tests are completely normal, you will receive your results only by: Marland Kitchen MyChart Message (if you have MyChart) OR . A paper copy in the mail If you have any lab test that is abnormal or we need to change your treatment, we will call you to review the results.  Testing/Procedures: None  Follow-Up: At [facility], you and your health needs are our priority.  As part of our continuing mission to provide you with exceptional heart care, we have created designated Provider Care Teams.  These Care Teams include your primary Cardiologist (physician) and Advanced Practice Providers (APPs -  Physician Assistants and Nurse Practitioners) who all work together to provide you with the care you need, when you need it. You will need a follow up appointment in 12 months.  Please call our office 2 months in advance to schedule this appointment.  You may see Lesleigh Noe, MD or one of the following Advanced Practice Providers on your designated Care Team:   Norma Fredrickson, NP Nada Boozer, NP . Georgie Chard, NP  Any Other Special Instructions Will Be Listed Below (If Applicable).  Please contact Dr. Paulita Fujita office about doing a Lovenox bridge in preparation for your upcoming procedure.

## 2017-11-23 NOTE — Progress Notes (Signed)
Cardiology Office Note:    Date:  11/23/2017   ID:  Eric Gallagher, DOB 11-11-1940, MRN [id number]  PCP:  Georgann Housekeeper, MD  Cardiologist:  Lesleigh Noe, MD   Referring MD: Georgann Housekeeper, MD   Chief Complaint  Patient presents with  . Advice Only    pre-op clearance  . Coronary Artery Disease    History of Present Illness:    Eric Gallagher is a 77 y.o. male with a hx of CAD status post DES to the LAD 2005 (cath 07/2014---> 90% third left ventricular branch of the RCA; 50-70% obtuse marginal; 55% proximal LAD)  , chronic atrial fibrillation, essential hypertension, diabetes mellitus type 2, chronic thrombocytopenia and chronic dyspnea and fatigue. Here to discuss work-up for dyspnea.  He denies chest pain, has chronic dyspnea but no orthopnea.  He has not had syncope.  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  He is on chronic Coumadin therapy managed by his primary physician, Dr. Eula Listen.  He denies nitroglycerin use.  He has not had significant lower extremity swelling.  He is able to sleep laying flat.  He has upcoming right hip replacement surgery and is requesting clearance from cardiac standpoint.  The procedure will be performed by Dr. Magnus Ivan.  Past Medical History:  Diagnosis Date  . Arrhythmia    Chronic AFIB, no anti-coag patient desire and low platelet count  . BPH (benign prostatic hyperplasia)   . Chronic a-fib   . Coronary artery disease    NSTEMI in 2005, Taxus DES LAD  . Diabetes mellitus without complication (HCC)    type II, some neuropathy  . ED (erectile dysfunction)   . Hyperlipidemia   . Hypertension   . Old MI (myocardial infarction)   . Thrombocytopenia (HCC)    chronic    Past Surgical History:  Procedure Laterality Date  . CARDIAC CATHETERIZATION N/A 07/24/2014   Procedure: Right/Left Heart Cath and Coronary Angiography;  Surgeon: Lyn Records, MD;  Location: [facility] INVASIVE CV LAB;  Service: Cardiovascular;  Laterality: N/A;  . I&D  EXTREMITY Right 06/18/2014   Procedure: OPEN TREATMENT RIGHT THUMB, INCOMPLETE AMPUTATION.  Washing and dressing applied to left thumb.;  Surgeon: Mack Hook, MD;  Location: [facility] OR;  Service: Orthopedics;  Laterality: Right;    Current Medications: Current Meds  Medication Sig  . ACCU-CHEK AVIVA PLUS test strip Use as directed weekly.  Marland Kitchen ACCU-CHEK SOFTCLIX LANCETS lancets Use as directed weekly.  Marland Kitchen alfuzosin (UROXATRAL) 10 MG 24 hr tablet Take 10 mg by mouth daily.   Marland Kitchen amLODipine (NORVASC) 5 MG tablet Take 5 mg by mouth daily.  . benazepril (LOTENSIN) 20 MG tablet Take 20 mg by mouth daily.  Marland Kitchen glipiZIDE (GLUCOTROL) 5 MG tablet Take 2.5 mg by mouth at bedtime.  . metFORMIN (GLUCOPHAGE) 1000 MG tablet Take 1,000 mg by mouth 2 (two) times daily with a meal.  . metoprolol (LOPRESSOR) 100 MG tablet Take 100 mg by mouth 2 (two) times daily.  . Naproxen Sodium (ALEVE PO) Take 2 tablets by mouth every morning.   . nitroGLYCERIN (NITROSTAT) 0.4 MG SL tablet Place 0.4 mg under the tongue every 5 (five) minutes as needed for chest pain.  . simvastatin (ZOCOR) 40 MG tablet Take 40 mg by mouth every evening.  . warfarin (COUMADIN) 5 MG tablet Take 5 mg by mouth daily. DO NOT take on Saturdays.     Allergies:   Flomax [tamsulosin hcl]   Social History  Socioeconomic History  . Marital status: Married    Spouse name: Not on file  . Number of children: Not on file  . Years of education: Not on file  . Highest education level: Not on file  Occupational History  . Not on file  Social Needs  . Financial resource strain: Not on file  . Food insecurity:    Worry: Not on file    Inability: Not on file  . Transportation needs:    Medical: Not on file    Non-medical: Not on file  Tobacco Use  . Smoking status: Former Games developer  . Smokeless tobacco: Never Used  Substance and Sexual Activity  . Alcohol use: No    Alcohol/week: 0.0 standard drinks  . Drug use: No  . Sexual activity: Not on file    Lifestyle  . Physical activity:    Days per week: Not on file    Minutes per session: Not on file  . Stress: Not on file  Relationships  . Social connections:    Talks on phone: Not on file    Gets together: Not on file    Attends religious service: Not on file    Active member of club or organization: Not on file    Attends meetings of clubs or organizations: Not on file    Relationship status: Not on file  Other Topics Concern  . Not on file  Social History Narrative  . Not on file     Family History: The patient's family history includes Aneurysm in his father; CVA in his mother; Heart attack in his maternal aunt; Heart disease in his brother, brother, and maternal aunt.  ROS:   Please see the history of present illness.    Hip pain, immobility, relatively sedentary lifestyle.  Last significant medical event was numbness that was felt to be stroke in 2017.  All other systems reviewed and are negative.  EKGs/Labs/Other Studies Reviewed:    The following studies were reviewed today:  Stress Myoview May 2018 Study Highlights     The left ventricular ejection fraction is normal (55-65%).  Nuclear stress EF: 56%.  No T wave inversion was noted during stress.  There was no ST segment deviation noted during stress.  Defect 1: There is a small defect of mild severity.  This is a low risk study.   Small size, mild intensity fixed apical/apical lateral perfusion defect, likely artifact. No reversible ischemia. LVEF 56% with normal wall motion. This is a low risk study.   2D Doppler echocardiogram June 2018  Study Conclusions   - Left ventricle: The cavity size was mildly dilated. Systolic   function was normal. The estimated ejection fraction was in the   range of 55% to 60%. Wall motion was normal; there were no   regional wall motion abnormalities. - Left atrium: The atrium was severely dilated. - Right ventricle: The cavity size was mildly dilated. Wall    thickness was normal. - Right atrium: The atrium was severely dilated. - Atrial septum: No defect or patent foramen ovale was identified. - Pulmonary arteries: PA peak pressure: 32 mm Hg (S).   EKG:  EKG is  ordered today.  The ekg ordered today demonstrates atrial fibrillation with slow ventricular response and nonspecific ST abnormality.  Recent Labs: No results found for requested labs within last 8760 hours.  Recent Lipid Panel No results found for: CHOL, TRIG, HDL, CHOLHDL, VLDL, LDLCALC, LDLDIRECT  Physical Exam:    VS:  BP  118/66   Pulse 69   Ht 6\' 3"  (1.905 m)   Wt 229 lb 12.8 oz (104.2 kg)   BMI 28.72 kg/m     Wt Readings from Last 3 Encounters:  11/23/17 229 lb 12.8 oz (104.2 kg)  08/17/16 228 lb (103.4 kg)  07/07/16 233 lb (105.7 kg)     GEN: Compensated and compatible with age well nourished, well developed in no acute distress HEENT: Normal NECK: No JVD. LYMPHATICS: No lymphadenopathy CARDIAC: IIRR, no murmur, no gallop, no edema. VASCULAR: 2+ bilateral radial  pulses.  Significant bilateral lower extremity venous varicosities.  No bruits. RESPIRATORY:  Clear to auscultation without rales, wheezing or rhonchi  ABDOMEN: Soft, non-tender, non-distended, No pulsatile mass, MUSCULOSKELETAL: No deformity  SKIN: Warm and dry NEUROLOGIC:  Alert and oriented x 3 PSYCHIATRIC:  Normal affect   ASSESSMENT:    1. Chronic atrial fibrillation   2. Cerebrovascular accident (CVA), unspecified mechanism (HCC)   3. HTN (hypertension), benign   4. Preoperative cardiovascular examination   5. Coronary artery disease involving native coronary artery of native heart with angina pectoris (HCC)   6. Type 2 diabetes mellitus with other circulatory complications (HCC)   7. Coronary artery disease involving native coronary artery of native heart without angina pectoris    PLAN:    In order of problems listed above:  1. Compensated with well controlled rate.  Chads VASC is >4.   Given prior potentially embolic CVA/TIA I would recommend discontinuing Coumadin 5 days prior to surgery but instituting Lovenox bridge once INR is less than 2.  This should be managed through his primary physician who follows his INR. 2. 2017 without recurrence 3. Excellent blood pressure control 4. Patient is cleared for upcoming hip surgery.  Relatively recent assessment of LV function and myocardial perfusion were low risk within the past 18 months and he has no new symptoms.  I do not feel that further risk stratification is needed at this time after examining the patient today. 5. He is stable from standpoint of angina.  No significant ischemia on nuclear stress test 15 months ago. 6. Not assessed  Clinical follow-up in 1 year.  He should stop Coumadin 3 to 5 days prior to planned hip surgery.  Should have Lovenox bridge.  Cleared from cardiac standpoint for upcoming hip surgery without further testing required   Medication Adjustments/Labs and Tests Ordered: Current medicines are reviewed at length with the patient today.  Concerns regarding medicines are outlined above.  Orders Placed This Encounter  Procedures  . EKG 12-Lead   No orders of the defined types were placed in this encounter.   Patient Instructions  Medication Instructions:  Your physician recommends that you continue on your current medications as directed. Please refer to the Current Medication list given to you today.  If you need a refill on your cardiac medications before your next appointment, please call your pharmacy.   Lab work: None If you have labs (blood work) drawn today and your tests are completely normal, you will receive your results only by: Marland Kitchen MyChart Message (if you have MyChart) OR . A paper copy in the mail If you have any lab test that is abnormal or we need to change your treatment, we will call you to review the results.  Testing/Procedures: None  Follow-Up: At [facility], you and  your health needs are our priority.  As part of our continuing mission to provide you with exceptional heart care, we have created  designated Provider Care Teams.  These Care Teams include your primary Cardiologist (physician) and Advanced Practice Providers (APPs -  Physician Assistants and Nurse Practitioners) who all work together to provide you with the care you need, when you need it. You will need a follow up appointment in 12 months.  Please call our office 2 months in advance to schedule this appointment.  You may see Lesleigh Noe, MD or one of the following Advanced Practice Providers on your designated Care Team:   Norma Fredrickson, NP Nada Boozer, NP . Georgie Chard, NP  Any Other Special Instructions Will Be Listed Below (If Applicable).  Please contact Dr. Paulita Fujita office about doing a Lovenox bridge in preparation for your upcoming procedure.       Signed, Lesleigh Noe, MD  11/23/2017 5:54 PM    [facility]

## 2017-11-29 ENCOUNTER — Telehealth (INDEPENDENT_AMBULATORY_CARE_PROVIDER_SITE_OTHER): Payer: Self-pay | Admitting: *Deleted

## 2017-11-29 NOTE — Telephone Encounter (Signed)
I called patient and advised still need clearance from Dr. Donette Larry and then pt will need return office visit per Bronson Curb.  I faxed clearance request to Dr. Donette Larry.

## 2017-11-29 NOTE — Telephone Encounter (Signed)
Pt called stating he is a CB pt and has been 3 weeks since he was suppose to be scheduled for surgery and he had an appt with his cardiologist on 11/23/17 for cardiac clearance and has been cleared and states now he is needing someone to schedule him.   Please call [phone] to get him scheduled.

## 2017-12-16 ENCOUNTER — Ambulatory Visit (INDEPENDENT_AMBULATORY_CARE_PROVIDER_SITE_OTHER): Payer: Medicare Other | Admitting: Physician Assistant

## 2017-12-16 ENCOUNTER — Ambulatory Visit (INDEPENDENT_AMBULATORY_CARE_PROVIDER_SITE_OTHER): Payer: Medicare Other | Admitting: Orthopaedic Surgery

## 2017-12-16 ENCOUNTER — Encounter (INDEPENDENT_AMBULATORY_CARE_PROVIDER_SITE_OTHER): Payer: Self-pay | Admitting: Orthopaedic Surgery

## 2017-12-16 DIAGNOSIS — M1612 Unilateral primary osteoarthritis, left hip: Secondary | ICD-10-CM | POA: Diagnosis not present

## 2017-12-16 DIAGNOSIS — M1611 Unilateral primary osteoarthritis, right hip: Secondary | ICD-10-CM | POA: Diagnosis not present

## 2017-12-16 NOTE — Progress Notes (Signed)
The patient comes in today again to discuss right total hip arthroplasty.  He does need his left hip replaced as well but he understands were to start with the worst hip in terms of radiographic findings.  He is now been cleared by cardiology for the surgery.  He is on Coumadin and knows to stop Coumadin 5 days before surgery and be bridged with Lovenox.  His primary care physician is also on board to help out with this.  He is a diabetic but under good control.  We had a long thorough discussion about surgery and about her recommendations.  Her prior note can be referred to in terms of our rationale behind recommending surgery due to the severity of his arthritis.  His pain is daily and it is detrimentally affected his activities daily living, his mobility and his quality of life.  He has had a handout on hip replacement surgery and we discussed with him in detail his intraoperative and postoperative course.  We discussed the risk minutes of the surgery and what our goals are.  All question concerns were answered and addressed.  We will work on having the surgery scheduled in the near future.

## 2017-12-27 ENCOUNTER — Other Ambulatory Visit (INDEPENDENT_AMBULATORY_CARE_PROVIDER_SITE_OTHER): Payer: Self-pay

## 2017-12-27 ENCOUNTER — Other Ambulatory Visit (INDEPENDENT_AMBULATORY_CARE_PROVIDER_SITE_OTHER): Payer: Self-pay | Admitting: Physician Assistant

## 2017-12-28 NOTE — Pre-Procedure Instructions (Addendum)
Otho BellowsDavid G Handlin  12/28/2017      [id number] - [place], [place] - [address] [place] [id number] Phone: [phone] Fax: [phone]    Your procedure is scheduled on Tuesday November 26.  [facility] Admitting at 11:45 A.M.  Call this number if you have problems the morning of surgery:  [phone]   Remember:  Do not eat or drink after midnight.    Take these medicines the morning of surgery with A SIP OF WATER:   Amlodipine (norvasc) Metoprolol (lopressor) Alfuzosin (Uroxatral)   DO NOT TAKE Metformin (glucophage) or Glipizide (Glucotrol) the day of surgery.   DO NOT TAKE Glipizide (Glucotrol) the night before surgery.   7 days prior to surgery STOP taking any Aspirin(unless otherwise instructed by your surgeon), Aleve, Naproxen, Ibuprofen, Motrin, Advil, Goody's, BC's, all herbal medications, fish oil, and all vitamins  FOLLOW YOUR surgeon's instructions on stopping Coumadin (Warfarin)     How to Manage Your Diabetes Before and After Surgery  Why is it important to control my blood sugar before and after surgery? . Improving blood sugar levels before and after surgery helps healing and can limit problems. . A way of improving blood sugar control is eating a healthy diet by: o  Eating less sugar and carbohydrates o  Increasing activity/exercise o  Talking with your doctor about reaching your blood sugar goals . High blood sugars (greater than 180 mg/dL) can raise your risk of infections and slow your recovery, so you will need to focus on controlling your diabetes during the weeks before surgery. . Make sure that the doctor who takes care of your diabetes knows about your planned surgery including the date and location.  How do I manage my blood sugar before surgery? . Check your blood sugar at least 4 times a day, starting 2 days before surgery, to make sure that the level is not too high or low. o Check  your blood sugar the morning of your surgery when you wake up and every 2 hours until you get to the Short Stay unit. . If your blood sugar is less than 70 mg/dL, you will need to treat for low blood sugar: o Do not take insulin. o Treat a low blood sugar (less than 70 mg/dL) with  cup of clear juice (cranberry or apple), 4 glucose tablets, OR glucose gel. Recheck blood sugar in 15 minutes after treatment (to make sure it is greater than 70 mg/dL). If your blood sugar is not greater than 70 mg/dL on recheck, call [phone] o  for further instructions. . Report your blood sugar to the short stay nurse when you get to Short Stay.  . If you are admitted to the hospital after surgery: o Your blood sugar will be checked by the staff and you will probably be given insulin after surgery (instead of oral diabetes medicines) to make sure you have good blood sugar levels. o The goal for blood sugar control after surgery is 80-180 mg/dL.                 Do not wear jewelry  Do not wear lotions, powders, or colognes, or deodorant.  Do not shave 48 hours prior to surgery.  Men may shave face and neck.  Do not bring valuables to the hospital.  [facility] any belongings or valuables.  Contacts, dentures or bridgework may not be worn into surgery.  Leave your suitcase in the car.  After surgery it may be brought to your room.  For patients admitted to the hospital, discharge time will be determined by your treatment team.  Patients discharged the day of surgery will not be allowed to drive home.    Special instructions:    [facility]- Preparing For Surgery  Before surgery, you can play an important role. Because skin is not sterile, your skin needs to be as free of germs as possible. You can reduce the number of germs on your skin by washing with CHG (chlorahexidine gluconate) Soap before surgery.  CHG is an antiseptic cleaner which kills germs and bonds with the  skin to continue killing germs even after washing.    Oral Hygiene is also important to reduce your risk of infection.  Remember - BRUSH YOUR TEETH THE MORNING OF SURGERY WITH YOUR REGULAR TOOTHPASTE  Please do not use if you have an allergy to CHG or antibacterial soaps. If your skin becomes reddened/irritated stop using the CHG.  Do not shave (including legs and underarms) for at least 48 hours prior to first CHG shower. It is OK to shave your face.  Please follow these instructions carefully.   1. Shower the NIGHT BEFORE SURGERY and the MORNING OF SURGERY with CHG.   2. If you chose to wash your hair, wash your hair first as usual with your normal shampoo.  3. After you shampoo, rinse your hair and body thoroughly to remove the shampoo.  4. Use CHG as you would any other liquid soap. You can apply CHG directly to the skin and wash gently with a scrungie or a clean washcloth.   5. Apply the CHG Soap to your body ONLY FROM THE NECK DOWN.  Do not use on open wounds or open sores. Avoid contact with your eyes, ears, mouth and genitals (private parts). Wash Face and genitals (private parts)  with your normal soap.  6. Wash thoroughly, paying special attention to the area where your surgery will be performed.  7. Thoroughly rinse your body with warm water from the neck down.  8. DO NOT shower/wash with your normal soap after using and rinsing off the CHG Soap.  9. Pat yourself dry with a CLEAN TOWEL.  10. Wear CLEAN PAJAMAS to bed the night before surgery, wear comfortable clothes the morning of surgery  11. Place CLEAN SHEETS on your bed the night of your first shower and DO NOT SLEEP WITH PETS.    Day of Surgery:  Do not apply any deodorants/lotions.  Please wear clean clothes to the hospital/surgery center.   Remember to brush your teeth WITH YOUR REGULAR TOOTHPASTE.    Please read over the following fact sheets that you were given. Coughing and Deep Breathing, MRSA  Information and Surgical Site Infection Prevention

## 2017-12-29 ENCOUNTER — Encounter (HOSPITAL_COMMUNITY): Payer: Self-pay

## 2017-12-29 ENCOUNTER — Encounter (HOSPITAL_COMMUNITY)
Admission: RE | Admit: 2017-12-29 | Discharge: 2017-12-29 | Disposition: A | Discharge status: Home / Self Care | Payer: Medicare Other | Source: Ambulatory Visit | Attending: Orthopaedic Surgery | Admitting: Orthopaedic Surgery

## 2017-12-29 ENCOUNTER — Other Ambulatory Visit: Payer: Self-pay

## 2017-12-29 DIAGNOSIS — M1611 Unilateral primary osteoarthritis, right hip: Secondary | ICD-10-CM | POA: Diagnosis not present

## 2017-12-29 DIAGNOSIS — Z01812 Encounter for preprocedural laboratory examination: Secondary | ICD-10-CM | POA: Insufficient documentation

## 2017-12-29 HISTORY — DX: Angina pectoris, unspecified: I20.9

## 2017-12-29 HISTORY — DX: Personal history of urinary calculi: Z87.442

## 2017-12-29 HISTORY — DX: Unspecified osteoarthritis, unspecified site: M19.90

## 2017-12-29 HISTORY — DX: Cerebral infarction, unspecified: I63.9

## 2017-12-29 HISTORY — DX: Cardiac arrhythmia, unspecified: I49.9

## 2017-12-29 LAB — CBC
HCT: 48.3 % (ref 39.0–52.0)
HEMOGLOBIN: 14.8 g/dL (ref 13.0–17.0)
MCH: 29.8 pg (ref 26.0–34.0)
MCHC: 30.6 g/dL (ref 30.0–36.0)
MCV: 97.2 fL (ref 80.0–100.0)
Platelets: 147 10*3/uL — ABNORMAL LOW (ref 150–400)
RBC: 4.97 MIL/uL (ref 4.22–5.81)
RDW: 13.4 % (ref 11.5–15.5)
WBC: 6.8 10*3/uL (ref 4.0–10.5)
nRBC: 0 % (ref 0.0–0.2)

## 2017-12-29 LAB — BASIC METABOLIC PANEL
ANION GAP: 9 (ref 5–15)
BUN: 18 mg/dL (ref 8–23)
CO2: 20 mmol/L — ABNORMAL LOW (ref 22–32)
Calcium: 9.1 mg/dL (ref 8.9–10.3)
Chloride: 106 mmol/L (ref 98–111)
Creatinine, Ser: 0.86 mg/dL (ref 0.61–1.24)
GFR calc Af Amer: >60 mL/min (ref >60)
GFR calc non Af Amer: >60 mL/min (ref >60)
Glucose, Bld: 137 mg/dL — ABNORMAL HIGH (ref 70–99)
POTASSIUM: 5.5 mmol/L — AB (ref 3.5–5.1)
SODIUM: 135 mmol/L (ref 135–145)

## 2017-12-29 LAB — GLUCOSE, CAPILLARY: Glucose-Capillary: 155 mg/dL — ABNORMAL HIGH (ref 70–99)

## 2017-12-29 LAB — HEMOGLOBIN A1C
Hgb A1c MFr Bld: 6.5 % — ABNORMAL HIGH (ref 4.8–5.6)
Mean Plasma Glucose: 139.85 mg/dL

## 2017-12-29 LAB — SURGICAL PCR SCREEN
MRSA, PCR: NEGATIVE
Staphylococcus aureus: NEGATIVE

## 2017-12-29 LAB — PROTIME-INR
INR: 2.66
PROTHROMBIN TIME: 27.9 s — AB (ref 11.4–15.2)

## 2017-12-29 NOTE — Anesthesia Preprocedure Evaluation (Addendum)
Anesthesia Evaluation  Patient identified by MRN, date of birth, ID band Patient awake    Reviewed: Allergy & Precautions, NPO status , Patient's Chart, lab work & pertinent test results, reviewed documented beta blocker date and time   Airway Mallampati: II  TM Distance: >3 FB Neck ROM: Full    Dental no notable dental hx. (+) Teeth Intact   Pulmonary former smoker,    Pulmonary exam normal breath sounds clear to auscultation       Cardiovascular hypertension, Pt. on medications and Pt. on home beta blockers + angina with exertion + CAD, + Past MI and + Cardiac Stents  Normal cardiovascular exam+ dysrhythmias Atrial Fibrillation  Rhythm:Regular Rate:Normal     Neuro/Psych CVA negative psych ROS   GI/Hepatic negative GI ROS, Neg liver ROS,   Endo/Other  diabetes, Well Controlled, Type 2, Oral Hypoglycemic AgentsHyperlipidemia  Renal/GU Hx/o renal calculi   BPH ED    Musculoskeletal  (+) Arthritis , Osteoarthritis,  OA Right hip   Abdominal   Peds  Hematology Chronic anticoagulation Coumadin - last dose 11/20 Lovenox- last dose  11/25   Anesthesia Other Findings   Reproductive/Obstetrics                            Anesthesia Physical Anesthesia Plan  ASA: III  Anesthesia Plan: Spinal   Post-op Pain Management:  Regional for Post-op pain   Induction:   PONV Risk Score and Plan: 2 and Ondansetron, Dexamethasone and Treatment may vary due to age or medical condition  Airway Management Planned: Natural Airway, Nasal Cannula and Simple Face Mask  Additional Equipment:   Intra-op Plan:   Post-operative Plan:   Informed Consent: I have reviewed the patients History and Physical, chart, labs and discussed the procedure including the risks, benefits and alternatives for the proposed anesthesia with the patient or authorized representative who has indicated his/her understanding and  acceptance.   Dental advisory given  Plan Discussed with: CRNA and Surgeon  Anesthesia Plan Comments: (Cardiac clearance 11/23/17 by Dr. Katrinka BlazingSmith. "Patient is cleared for upcoming hip surgery.  Relatively recent assessment of LV function and myocardial perfusion were low risk within the past 18 months and he has no new symptoms.  I do not feel that further risk stratification is needed at this time after examining the patient today. 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  He is on chronic Coumadin therapy managed by his primary physician, Dr. Eula ListenHussain. He is on lovenox bridge prior to surgery )       Anesthesia Quick Evaluation

## 2017-12-29 NOTE — Progress Notes (Signed)
Pt. Clear on holding Coumadin & bridging with Lovenox. Pt. Has understanding of holding Metformin day of surgery & holding Glipizide the night before surgery.    Pt. Has in Epic,  cardiac clearance with Dr. Mendel RyderH. Smith. Pt. Denies all chest concerns.

## 2018-01-03 MED ORDER — TRANEXAMIC ACID-NACL 1000-0.7 MG/100ML-% IV SOLN
1000.0000 mg | INTRAVENOUS | Status: AC
  Administered 2018-01-04: 1000 mg via INTRAVENOUS
  Filled 2018-01-03: qty 100

## 2018-01-04 ENCOUNTER — Encounter (HOSPITAL_COMMUNITY): Payer: Self-pay | Admitting: Anesthesiology

## 2018-01-04 ENCOUNTER — Inpatient Hospital Stay (HOSPITAL_COMMUNITY): Payer: Medicare Other | Admitting: Physician Assistant

## 2018-01-04 ENCOUNTER — Inpatient Hospital Stay (HOSPITAL_COMMUNITY): Payer: Medicare Other

## 2018-01-04 ENCOUNTER — Encounter (HOSPITAL_COMMUNITY)
Admission: RE | Disposition: A | Discharge status: Home Health | Payer: Self-pay | Source: Home / Self Care | Attending: Orthopaedic Surgery

## 2018-01-04 ENCOUNTER — Inpatient Hospital Stay (HOSPITAL_COMMUNITY): Payer: Medicare Other | Admitting: Anesthesiology

## 2018-01-04 ENCOUNTER — Inpatient Hospital Stay (HOSPITAL_COMMUNITY)
Admission: RE | Admit: 2018-01-04 | Discharge: 2018-01-07 | DRG: 470 | Disposition: A | Discharge status: Home Health | Payer: Medicare Other | Attending: Orthopaedic Surgery | Admitting: Orthopaedic Surgery

## 2018-01-04 DIAGNOSIS — Z9842 Cataract extraction status, left eye: Secondary | ICD-10-CM

## 2018-01-04 DIAGNOSIS — M25551 Pain in right hip: Secondary | ICD-10-CM | POA: Diagnosis present

## 2018-01-04 DIAGNOSIS — Z9049 Acquired absence of other specified parts of digestive tract: Secondary | ICD-10-CM | POA: Diagnosis not present

## 2018-01-04 DIAGNOSIS — I10 Essential (primary) hypertension: Secondary | ICD-10-CM | POA: Diagnosis present

## 2018-01-04 DIAGNOSIS — M2170 Unequal limb length (acquired), unspecified site: Secondary | ICD-10-CM | POA: Diagnosis present

## 2018-01-04 DIAGNOSIS — Z7984 Long term (current) use of oral hypoglycemic drugs: Secondary | ICD-10-CM | POA: Diagnosis not present

## 2018-01-04 DIAGNOSIS — R0609 Other forms of dyspnea: Secondary | ICD-10-CM | POA: Diagnosis present

## 2018-01-04 DIAGNOSIS — E1159 Type 2 diabetes mellitus with other circulatory complications: Secondary | ICD-10-CM | POA: Diagnosis present

## 2018-01-04 DIAGNOSIS — R339 Retention of urine, unspecified: Secondary | ICD-10-CM | POA: Diagnosis not present

## 2018-01-04 DIAGNOSIS — D696 Thrombocytopenia, unspecified: Secondary | ICD-10-CM | POA: Diagnosis present

## 2018-01-04 DIAGNOSIS — N5201 Erectile dysfunction due to arterial insufficiency: Secondary | ICD-10-CM | POA: Diagnosis present

## 2018-01-04 DIAGNOSIS — Z7901 Long term (current) use of anticoagulants: Secondary | ICD-10-CM

## 2018-01-04 DIAGNOSIS — E1140 Type 2 diabetes mellitus with diabetic neuropathy, unspecified: Secondary | ICD-10-CM | POA: Diagnosis present

## 2018-01-04 DIAGNOSIS — M1611 Unilateral primary osteoarthritis, right hip: Secondary | ICD-10-CM | POA: Diagnosis present

## 2018-01-04 DIAGNOSIS — Z79899 Other long term (current) drug therapy: Secondary | ICD-10-CM

## 2018-01-04 DIAGNOSIS — Z89012 Acquired absence of left thumb: Secondary | ICD-10-CM | POA: Diagnosis not present

## 2018-01-04 DIAGNOSIS — I959 Hypotension, unspecified: Secondary | ICD-10-CM | POA: Diagnosis present

## 2018-01-04 DIAGNOSIS — Z823 Family history of stroke: Secondary | ICD-10-CM

## 2018-01-04 DIAGNOSIS — E785 Hyperlipidemia, unspecified: Secondary | ICD-10-CM | POA: Diagnosis present

## 2018-01-04 DIAGNOSIS — Z87891 Personal history of nicotine dependence: Secondary | ICD-10-CM

## 2018-01-04 DIAGNOSIS — Z419 Encounter for procedure for purposes other than remedying health state, unspecified: Secondary | ICD-10-CM

## 2018-01-04 DIAGNOSIS — I482 Chronic atrial fibrillation, unspecified: Secondary | ICD-10-CM | POA: Diagnosis present

## 2018-01-04 DIAGNOSIS — D62 Acute posthemorrhagic anemia: Secondary | ICD-10-CM | POA: Diagnosis present

## 2018-01-04 DIAGNOSIS — I2510 Atherosclerotic heart disease of native coronary artery without angina pectoris: Secondary | ICD-10-CM | POA: Diagnosis present

## 2018-01-04 DIAGNOSIS — Z8673 Personal history of transient ischemic attack (TIA), and cerebral infarction without residual deficits: Secondary | ICD-10-CM | POA: Diagnosis not present

## 2018-01-04 DIAGNOSIS — M160 Bilateral primary osteoarthritis of hip: Secondary | ICD-10-CM | POA: Diagnosis present

## 2018-01-04 DIAGNOSIS — Z96641 Presence of right artificial hip joint: Secondary | ICD-10-CM

## 2018-01-04 DIAGNOSIS — Z8249 Family history of ischemic heart disease and other diseases of the circulatory system: Secondary | ICD-10-CM

## 2018-01-04 DIAGNOSIS — Z955 Presence of coronary angioplasty implant and graft: Secondary | ICD-10-CM | POA: Diagnosis not present

## 2018-01-04 DIAGNOSIS — Z888 Allergy status to other drugs, medicaments and biological substances status: Secondary | ICD-10-CM

## 2018-01-04 DIAGNOSIS — N400 Enlarged prostate without lower urinary tract symptoms: Secondary | ICD-10-CM | POA: Diagnosis present

## 2018-01-04 DIAGNOSIS — I252 Old myocardial infarction: Secondary | ICD-10-CM

## 2018-01-04 DIAGNOSIS — Z87442 Personal history of urinary calculi: Secondary | ICD-10-CM

## 2018-01-04 DIAGNOSIS — Z9841 Cataract extraction status, right eye: Secondary | ICD-10-CM

## 2018-01-04 HISTORY — PX: TOTAL HIP ARTHROPLASTY: SHX124

## 2018-01-04 LAB — PROTIME-INR
INR: 1.22
PROTHROMBIN TIME: 15.3 s — AB (ref 11.4–15.2)

## 2018-01-04 LAB — BASIC METABOLIC PANEL
Anion gap: 8 (ref 5–15)
BUN: 11 mg/dL (ref 8–23)
CALCIUM: 9.4 mg/dL (ref 8.9–10.3)
CO2: 26 mmol/L (ref 22–32)
Chloride: 104 mmol/L (ref 98–111)
Creatinine, Ser: 0.97 mg/dL (ref 0.61–1.24)
GFR calc Af Amer: >60 mL/min (ref >60)
GFR calc non Af Amer: >60 mL/min (ref >60)
Glucose, Bld: 165 mg/dL — ABNORMAL HIGH (ref 70–99)
Potassium: 4.3 mmol/L (ref 3.5–5.1)
Sodium: 138 mmol/L (ref 135–145)

## 2018-01-04 LAB — GLUCOSE, CAPILLARY
Glucose-Capillary: 148 mg/dL — ABNORMAL HIGH (ref 70–99)
Glucose-Capillary: 110 mg/dL — ABNORMAL HIGH (ref 70–99)
Glucose-Capillary: 236 mg/dL — ABNORMAL HIGH (ref 70–99)

## 2018-01-04 SURGERY — ARTHROPLASTY, HIP, TOTAL, ANTERIOR APPROACH
Anesthesia: Spinal | Site: Hip | Laterality: Right

## 2018-01-04 MED ORDER — ONDANSETRON HCL 4 MG/2ML IJ SOLN: 4.0000 mg | Freq: Once | INTRAMUSCULAR | Status: DC | PRN

## 2018-01-04 MED ORDER — METHOCARBAMOL 1000 MG/10ML IJ SOLN
500.0000 mg | Freq: Four times a day (QID) | INTRAVENOUS | Status: DC | PRN
  Filled 2018-01-04: qty 5

## 2018-01-04 MED ORDER — PHENYLEPHRINE 40 MCG/ML (10ML) SYRINGE FOR IV PUSH (FOR BLOOD PRESSURE SUPPORT)
PREFILLED_SYRINGE | INTRAVENOUS | Status: AC
  Filled 2018-01-04: qty 10

## 2018-01-04 MED ORDER — SIMVASTATIN 40 MG PO TABS
40.0000 mg | ORAL_TABLET | Freq: Every evening | ORAL | Status: DC
  Administered 2018-01-04 – 2018-01-06 (×3): 40 mg via ORAL
  Filled 2018-01-04: qty 1
  Filled 2018-01-04: qty 2
  Filled 2018-01-04: qty 1
  Filled 2018-01-04: qty 2
  Filled 2018-01-04 (×2): qty 1

## 2018-01-04 MED ORDER — DEXAMETHASONE SODIUM PHOSPHATE 10 MG/ML IJ SOLN
INTRAMUSCULAR | Status: AC
  Filled 2018-01-04: qty 2

## 2018-01-04 MED ORDER — SODIUM CHLORIDE 0.9 % IV SOLN: INTRAVENOUS | Status: DC

## 2018-01-04 MED ORDER — LIDOCAINE 2% (20 MG/ML) 5 ML SYRINGE
INTRAMUSCULAR | Status: DC | PRN
  Administered 2018-01-04: 60 mg via INTRAVENOUS

## 2018-01-04 MED ORDER — METHOCARBAMOL 500 MG PO TABS
500.0000 mg | ORAL_TABLET | Freq: Four times a day (QID) | ORAL | Status: DC | PRN
  Administered 2018-01-04 – 2018-01-07 (×5): 500 mg via ORAL
  Filled 2018-01-04 (×5): qty 1

## 2018-01-04 MED ORDER — CEFAZOLIN SODIUM-DEXTROSE 2-4 GM/100ML-% IV SOLN
INTRAVENOUS | Status: AC
  Filled 2018-01-04: qty 100

## 2018-01-04 MED ORDER — DEXAMETHASONE SODIUM PHOSPHATE 10 MG/ML IJ SOLN
INTRAMUSCULAR | Status: AC
  Filled 2018-01-04: qty 1

## 2018-01-04 MED ORDER — CEFAZOLIN SODIUM-DEXTROSE 1-4 GM/50ML-% IV SOLN
1.0000 g | Freq: Four times a day (QID) | INTRAVENOUS | Status: AC
  Administered 2018-01-04 – 2018-01-05 (×2): 1 g via INTRAVENOUS
  Filled 2018-01-04 (×2): qty 50

## 2018-01-04 MED ORDER — POLYETHYLENE GLYCOL 3350 17 G PO PACK: 17.0000 g | PACK | Freq: Every day | ORAL | Status: DC | PRN

## 2018-01-04 MED ORDER — PHENYLEPHRINE HCL 10 MG/ML IJ SOLN
INTRAMUSCULAR | Status: DC | PRN
  Administered 2018-01-04: 40 ug via INTRAVENOUS
  Administered 2018-01-04: 80 ug via INTRAVENOUS

## 2018-01-04 MED ORDER — BUPIVACAINE IN DEXTROSE 0.75-8.25 % IT SOLN
INTRATHECAL | Status: AC
  Filled 2018-01-04: qty 4

## 2018-01-04 MED ORDER — CEFAZOLIN SODIUM-DEXTROSE 2-3 GM-%(50ML) IV SOLR
INTRAVENOUS | Status: DC | PRN
  Administered 2018-01-04: 2 g via INTRAVENOUS

## 2018-01-04 MED ORDER — ONDANSETRON HCL 4 MG PO TABS: 4.0000 mg | ORAL_TABLET | Freq: Four times a day (QID) | ORAL | Status: DC | PRN

## 2018-01-04 MED ORDER — BENAZEPRIL HCL 20 MG PO TABS
20.0000 mg | ORAL_TABLET | Freq: Every day | ORAL | Status: DC
  Administered 2018-01-04 – 2018-01-05 (×2): 20 mg via ORAL
  Filled 2018-01-04 (×2): qty 1

## 2018-01-04 MED ORDER — METOCLOPRAMIDE HCL 5 MG/ML IJ SOLN: 5.0000 mg | Freq: Three times a day (TID) | INTRAMUSCULAR | Status: DC | PRN

## 2018-01-04 MED ORDER — DOCUSATE SODIUM 100 MG PO CAPS
100.0000 mg | ORAL_CAPSULE | Freq: Two times a day (BID) | ORAL | Status: DC
  Administered 2018-01-04 – 2018-01-07 (×6): 100 mg via ORAL
  Filled 2018-01-04 (×7): qty 1

## 2018-01-04 MED ORDER — METOPROLOL TARTRATE 100 MG PO TABS
100.0000 mg | ORAL_TABLET | Freq: Two times a day (BID) | ORAL | Status: DC
  Administered 2018-01-04 – 2018-01-07 (×5): 100 mg via ORAL
  Filled 2018-01-04 (×5): qty 1

## 2018-01-04 MED ORDER — 0.9 % SODIUM CHLORIDE (POUR BTL) OPTIME
TOPICAL | Status: DC | PRN
  Administered 2018-01-04: 1000 mL

## 2018-01-04 MED ORDER — PROPOFOL 10 MG/ML IV BOLUS
INTRAVENOUS | Status: AC
  Filled 2018-01-04: qty 20

## 2018-01-04 MED ORDER — BUPIVACAINE IN DEXTROSE 0.75-8.25 % IT SOLN
INTRATHECAL | Status: DC | PRN
  Administered 2018-01-04: 2 mL via INTRATHECAL

## 2018-01-04 MED ORDER — ONDANSETRON HCL 4 MG/2ML IJ SOLN
INTRAMUSCULAR | Status: DC | PRN
  Administered 2018-01-04: 4 mg via INTRAVENOUS

## 2018-01-04 MED ORDER — ALUM & MAG HYDROXIDE-SIMETH 200-200-20 MG/5ML PO SUSP: 30.0000 mL | ORAL | Status: DC | PRN

## 2018-01-04 MED ORDER — OXYCODONE HCL 5 MG PO TABS
10.0000 mg | ORAL_TABLET | ORAL | Status: DC | PRN
  Administered 2018-01-06: 10 mg via ORAL
  Filled 2018-01-04: qty 2

## 2018-01-04 MED ORDER — HYDROMORPHONE HCL 1 MG/ML IJ SOLN
0.5000 mg | INTRAMUSCULAR | Status: DC | PRN
  Administered 2018-01-04: 1 mg via INTRAVENOUS
  Administered 2018-01-05: 0.5 mg via INTRAVENOUS
  Administered 2018-01-05: 1 mg via INTRAVENOUS
  Filled 2018-01-04 (×3): qty 1

## 2018-01-04 MED ORDER — ONDANSETRON HCL 4 MG/2ML IJ SOLN
INTRAMUSCULAR | Status: AC
  Filled 2018-01-04: qty 2

## 2018-01-04 MED ORDER — FENTANYL CITRATE (PF) 250 MCG/5ML IJ SOLN
INTRAMUSCULAR | Status: AC
  Filled 2018-01-04: qty 5

## 2018-01-04 MED ORDER — WARFARIN - PHARMACIST DOSING INPATIENT
Freq: Every day | Status: DC
  Administered 2018-01-05 – 2018-01-06 (×2)

## 2018-01-04 MED ORDER — MIDAZOLAM HCL 2 MG/2ML IJ SOLN
INTRAMUSCULAR | Status: AC
  Filled 2018-01-04: qty 2

## 2018-01-04 MED ORDER — FENTANYL CITRATE (PF) 100 MCG/2ML IJ SOLN
INTRAMUSCULAR | Status: DC | PRN
  Administered 2018-01-04: 25 ug via INTRAVENOUS

## 2018-01-04 MED ORDER — SODIUM CHLORIDE 0.9 % IR SOLN
Status: DC | PRN
  Administered 2018-01-04: 3000 mL

## 2018-01-04 MED ORDER — ACETAMINOPHEN 325 MG PO TABS
325.0000 mg | ORAL_TABLET | Freq: Four times a day (QID) | ORAL | Status: DC | PRN
  Administered 2018-01-05 – 2018-01-07 (×2): 650 mg via ORAL
  Filled 2018-01-04 (×2): qty 2

## 2018-01-04 MED ORDER — MENTHOL 3 MG MT LOZG: 1.0000 | LOZENGE | OROMUCOSAL | Status: DC | PRN

## 2018-01-04 MED ORDER — PHENOL 1.4 % MT LIQD: 1.0000 | OROMUCOSAL | Status: DC | PRN

## 2018-01-04 MED ORDER — EPHEDRINE SULFATE 50 MG/ML IJ SOLN
INTRAMUSCULAR | Status: DC | PRN
  Administered 2018-01-04: 10 mg via INTRAVENOUS
  Administered 2018-01-04: 5 mg via INTRAVENOUS
  Administered 2018-01-04 (×3): 10 mg via INTRAVENOUS

## 2018-01-04 MED ORDER — METOCLOPRAMIDE HCL 5 MG PO TABS: 5.0000 mg | ORAL_TABLET | Freq: Three times a day (TID) | ORAL | Status: DC | PRN

## 2018-01-04 MED ORDER — WARFARIN SODIUM 7.5 MG PO TABS
7.5000 mg | ORAL_TABLET | Freq: Once | ORAL | Status: AC
  Administered 2018-01-04: 7.5 mg via ORAL
  Filled 2018-01-04: qty 1

## 2018-01-04 MED ORDER — SODIUM CHLORIDE 0.9 % IV SOLN
INTRAVENOUS | Status: DC | PRN
  Administered 2018-01-04: 20 ug/min via INTRAVENOUS

## 2018-01-04 MED ORDER — FENTANYL CITRATE (PF) 100 MCG/2ML IJ SOLN: 25.0000 ug | INTRAMUSCULAR | Status: DC | PRN

## 2018-01-04 MED ORDER — METFORMIN HCL 500 MG PO TABS
1000.0000 mg | ORAL_TABLET | Freq: Two times a day (BID) | ORAL | Status: DC
  Administered 2018-01-04 – 2018-01-07 (×7): 1000 mg via ORAL
  Filled 2018-01-04 (×7): qty 2

## 2018-01-04 MED ORDER — LACTATED RINGERS IV SOLN
INTRAVENOUS | Status: DC
  Administered 2018-01-04: 12:00:00 via INTRAVENOUS

## 2018-01-04 MED ORDER — EPHEDRINE 5 MG/ML INJ
INTRAVENOUS | Status: AC
  Filled 2018-01-04: qty 10

## 2018-01-04 MED ORDER — GLIPIZIDE 5 MG PO TABS
2.5000 mg | ORAL_TABLET | Freq: Every day | ORAL | Status: DC
  Administered 2018-01-04 – 2018-01-07 (×4): 2.5 mg via ORAL
  Filled 2018-01-04 (×4): qty 1

## 2018-01-04 MED ORDER — PANTOPRAZOLE SODIUM 40 MG PO TBEC
40.0000 mg | DELAYED_RELEASE_TABLET | Freq: Every day | ORAL | Status: DC
  Administered 2018-01-04 – 2018-01-07 (×4): 40 mg via ORAL
  Filled 2018-01-04 (×4): qty 1

## 2018-01-04 MED ORDER — DIPHENHYDRAMINE HCL 12.5 MG/5ML PO ELIX: 12.5000 mg | ORAL_SOLUTION | ORAL | Status: DC | PRN

## 2018-01-04 MED ORDER — OXYCODONE HCL 5 MG PO TABS
5.0000 mg | ORAL_TABLET | ORAL | Status: DC | PRN
  Administered 2018-01-04: 5 mg via ORAL
  Administered 2018-01-05 – 2018-01-07 (×3): 10 mg via ORAL
  Administered 2018-01-07: 5 mg via ORAL
  Filled 2018-01-04 (×2): qty 2
  Filled 2018-01-04: qty 1
  Filled 2018-01-04 (×2): qty 2

## 2018-01-04 MED ORDER — PROPOFOL 500 MG/50ML IV EMUL
INTRAVENOUS | Status: DC | PRN
  Administered 2018-01-04: 75 ug/kg/min via INTRAVENOUS

## 2018-01-04 MED ORDER — ESMOLOL HCL 100 MG/10ML IV SOLN
INTRAVENOUS | Status: AC
  Filled 2018-01-04: qty 10

## 2018-01-04 MED ORDER — SUGAMMADEX SODIUM 500 MG/5ML IV SOLN
INTRAVENOUS | Status: AC
  Filled 2018-01-04: qty 5

## 2018-01-04 MED ORDER — ONDANSETRON HCL 4 MG/2ML IJ SOLN: 4.0000 mg | Freq: Four times a day (QID) | INTRAMUSCULAR | Status: DC | PRN

## 2018-01-04 MED ORDER — ALFUZOSIN HCL ER 10 MG PO TB24
10.0000 mg | ORAL_TABLET | Freq: Every day | ORAL | Status: DC
  Administered 2018-01-04 – 2018-01-06 (×3): 10 mg via ORAL
  Filled 2018-01-04 (×3): qty 1

## 2018-01-04 MED ORDER — AMLODIPINE BESYLATE 5 MG PO TABS
5.0000 mg | ORAL_TABLET | Freq: Every day | ORAL | Status: DC
  Administered 2018-01-05: 5 mg via ORAL
  Filled 2018-01-04: qty 1

## 2018-01-04 SURGICAL SUPPLY — 57 items
BENZOIN TINCTURE PRP APPL 2/3 (GAUZE/BANDAGES/DRESSINGS) ×3 IMPLANT
BLADE CLIPPER SURG (BLADE) IMPLANT
BLADE SAW SGTL 18X1.27X75 (BLADE) ×2 IMPLANT
BLADE SAW SGTL 18X1.27X75MM (BLADE) ×1
CLOSURE STERI-STRIP 1/2X4 (GAUZE/BANDAGES/DRESSINGS) ×1
CLOSURE WOUND 1/2 X4 (GAUZE/BANDAGES/DRESSINGS) ×2
CLSR STERI-STRIP ANTIMIC 1/2X4 (GAUZE/BANDAGES/DRESSINGS) ×2 IMPLANT
COVER SURGICAL LIGHT HANDLE (MISCELLANEOUS) ×3 IMPLANT
COVER WAND RF STERILE (DRAPES) ×3 IMPLANT
CUP SECTOR GRIPTON 58MM (Orthopedic Implant) ×3 IMPLANT
DRAPE C-ARM 42X72 X-RAY (DRAPES) ×3 IMPLANT
DRAPE STERI IOBAN 125X83 (DRAPES) ×3 IMPLANT
DRAPE U-SHAPE 47X51 STRL (DRAPES) ×9 IMPLANT
DRSG AQUACEL AG ADV 3.5X10 (GAUZE/BANDAGES/DRESSINGS) ×3 IMPLANT
DURAPREP 26ML APPLICATOR (WOUND CARE) ×3 IMPLANT
ELECT BLADE 4.0 EZ CLEAN MEGAD (MISCELLANEOUS) ×3
ELECT BLADE 6.5 EXT (BLADE) IMPLANT
ELECT REM PT RETURN 9FT ADLT (ELECTROSURGICAL) ×3
ELECTRODE BLDE 4.0 EZ CLN MEGD (MISCELLANEOUS) ×1 IMPLANT
ELECTRODE REM PT RTRN 9FT ADLT (ELECTROSURGICAL) ×1 IMPLANT
FACESHIELD WRAPAROUND (MASK) ×6 IMPLANT
GLOVE BIOGEL PI IND STRL 8 (GLOVE) ×2 IMPLANT
GLOVE BIOGEL PI INDICATOR 8 (GLOVE) ×4
GLOVE ECLIPSE 8.0 STRL XLNG CF (GLOVE) ×3 IMPLANT
GLOVE ORTHO TXT STRL SZ7.5 (GLOVE) ×6 IMPLANT
GOWN STRL REUS W/ TWL LRG LVL3 (GOWN DISPOSABLE) ×2 IMPLANT
GOWN STRL REUS W/ TWL XL LVL3 (GOWN DISPOSABLE) ×2 IMPLANT
GOWN STRL REUS W/TWL LRG LVL3 (GOWN DISPOSABLE) ×4
GOWN STRL REUS W/TWL XL LVL3 (GOWN DISPOSABLE) ×4
HANDPIECE INTERPULSE COAX TIP (DISPOSABLE) ×2
HEAD M SROM 36MM PLUS 1.5 (Hips) ×1 IMPLANT
KIT BASIN OR (CUSTOM PROCEDURE TRAY) ×3 IMPLANT
KIT TURNOVER KIT B (KITS) ×3 IMPLANT
LINER NEUTRAL 52X36X58N (Liner) ×3 IMPLANT
MANIFOLD NEPTUNE II (INSTRUMENTS) ×3 IMPLANT
NS IRRIG 1000ML POUR BTL (IV SOLUTION) ×3 IMPLANT
PACK TOTAL JOINT (CUSTOM PROCEDURE TRAY) ×3 IMPLANT
PAD ARMBOARD 7.5X6 YLW CONV (MISCELLANEOUS) ×3 IMPLANT
SCREW 6.5MMX30MM (Screw) ×3 IMPLANT
SET HNDPC FAN SPRY TIP SCT (DISPOSABLE) ×1 IMPLANT
SROM M HEAD 36MM PLUS 1.5 (Hips) ×3 IMPLANT
STAPLER VISISTAT 35W (STAPLE) IMPLANT
STEM CORAIL KA12 (Stem) ×3 IMPLANT
STRIP CLOSURE SKIN 1/2X4 (GAUZE/BANDAGES/DRESSINGS) ×4 IMPLANT
SUT ETHIBOND NAB CT1 #1 30IN (SUTURE) ×3 IMPLANT
SUT MNCRL AB 4-0 PS2 18 (SUTURE) ×3 IMPLANT
SUT VIC AB 0 CT1 27 (SUTURE) ×2
SUT VIC AB 0 CT1 27XBRD ANBCTR (SUTURE) ×1 IMPLANT
SUT VIC AB 1 CT1 27 (SUTURE) ×2
SUT VIC AB 1 CT1 27XBRD ANBCTR (SUTURE) ×1 IMPLANT
SUT VIC AB 2-0 CT1 27 (SUTURE) ×2
SUT VIC AB 2-0 CT1 TAPERPNT 27 (SUTURE) ×1 IMPLANT
TOWEL OR 17X24 6PK STRL BLUE (TOWEL DISPOSABLE) ×3 IMPLANT
TOWEL OR 17X26 10 PK STRL BLUE (TOWEL DISPOSABLE) ×3 IMPLANT
TRAY CATH 16FR W/PLASTIC CATH (SET/KITS/TRAYS/PACK) IMPLANT
TRAY FOLEY MTR SLVR 16FR STAT (SET/KITS/TRAYS/PACK) ×3 IMPLANT
WATER STERILE IRR 1000ML POUR (IV SOLUTION) ×6 IMPLANT

## 2018-01-04 NOTE — Anesthesia Procedure Notes (Signed)
Procedure Name: MAC Date/Time: 01/04/2018 12:35 PM Performed by: Glynda Jaeger, CRNA Pre-anesthesia Checklist: Patient identified, Emergency Drugs available, Suction available, Timeout performed and Patient being monitored Patient Re-evaluated:Patient Re-evaluated prior to induction Oxygen Delivery Method: Simple face mask Placement Confirmation: positive ETCO2

## 2018-01-04 NOTE — H&P (Signed)
TOTAL HIP ADMISSION H&P  Patient is admitted for right total hip arthroplasty.  Subjective:  Chief Complaint: right hip pain  HPI: Eric Gallagher, 77 y.o. male, has a history of pain and functional disability in the right hip(s) due to arthritis and patient has failed non-surgical conservative treatments for greater than 12 weeks to include NSAID's and/or analgesics, corticosteriod injections, flexibility and strengthening excercises, use of assistive devices and activity modification.  Onset of symptoms was gradual starting 1 years ago with gradually worsening course since that time.The patient noted no past surgery on the right hip(s).  Patient currently rates pain in the right hip at 10 out of 10 with activity. Patient has night pain, worsening of pain with activity and weight bearing, trendelenberg gait, pain that interfers with activities of daily living and pain with passive range of motion. Patient has evidence of subchondral cysts, subchondral sclerosis, periarticular osteophytes and joint space narrowing by imaging studies. This condition presents safety issues increasing the risk of falls.  There is no current active infection.  Patient Active Problem List   Diagnosis Date Noted  . Unilateral primary osteoarthritis, right hip 12/16/2017  . Unilateral primary osteoarthritis, left hip 12/16/2017  . CVA (cerebral vascular accident) (HCC) 06/25/2016  . HTN (hypertension), benign 06/25/2016  . Dyspnea on exertion 05/29/2014  . Coronary artery disease involving native coronary artery of native heart with angina pectoris (HCC) 05/29/2014  . Erectile dysfunction due to arterial insufficiency 05/29/2014  . Type 2 diabetes mellitus with other circulatory complications (HCC) 05/29/2014  . Chronic atrial fibrillation 05/29/2014   Past Medical History:  Diagnosis Date  . Anginal pain (HCC)    h/o   . Arrhythmia    Chronic AFIB, no anti-coag patient desire and low platelet count  . Arthritis     both hips   . BPH (benign prostatic hyperplasia)   . Chronic a-fib   . Coronary artery disease    NSTEMI in 2005, Taxus DES LAD  . Diabetes mellitus without complication (HCC)    type II, some neuropathy  . Dysrhythmia    afib  . ED (erectile dysfunction)   . History of kidney stones 2012   passed spontaneously  . Hyperlipidemia   . Hypertension   . Old MI (myocardial infarction)   . Stroke (HCC) 2017  . Thrombocytopenia (HCC)    chronic    Past Surgical History:  Procedure Laterality Date  . APPENDECTOMY     as a teen  . CARDIAC CATHETERIZATION N/A 07/24/2014   Procedure: Right/Left Heart Cath and Coronary Angiography;  Surgeon: Lyn RecordsHenry W Smith, MD;  [facility];  Service: Cardiovascular;  Laterality: N/A;  . EYE SURGERY Left 2016   retinal surgery, cataracts removed- both eyes  . HERNIA REPAIR  1998   L inguinal   . I&D EXTREMITY Right 06/18/2014   Procedure: OPEN TREATMENT RIGHT THUMB, INCOMPLETE AMPUTATION.  Washing and dressing applied to left thumb.;  Surgeon: Mack Hookavid Thompson, MD;  [facility];  Service: Orthopedics;  Laterality: Right;    Current Facility-Administered Medications  Medication Dose Route Frequency Provider Last Rate Last Dose  . ceFAZolin (ANCEF) 2-4 GM/100ML-% IVPB           . lactated ringers infusion   Intravenous Continuous Mal AmabileFoster, Michael, MD      . tranexamic acid (CYKLOKAPRON) IVPB 1,000 mg  1,000 mg Intravenous To OR Kathryne HitchBlackman, Christopher Y, MD       Allergies  Allergen Reactions  . Flomax [  Tamsulosin Hcl] Other (See Comments)    Dizzy    Social History   Tobacco Use  . Smoking status: Former Smoker    Last attempt to quit: 12/29/1965    Years since quitting: 52.0  . Smokeless tobacco: Never Used  Substance Use Topics  . Alcohol use: No    Alcohol/week: 0.0 standard drinks    Family History  Problem Relation Age of Onset  . CVA Mother   . Aneurysm Father   . Heart disease Brother   . Heart disease Brother   .  Heart attack Maternal Aunt   . Heart disease Maternal Aunt      Review of Systems  Musculoskeletal: Positive for joint pain.  All other systems reviewed and are negative.   Objective:  Physical Exam  Constitutional: He is oriented to person, place, and time. He appears well-developed and well-nourished.  HENT:  Head: Normocephalic and atraumatic.  Eyes: Pupils are equal, round, and reactive to light. EOM are normal.  Neck: Normal range of motion. Neck supple.  Cardiovascular: Normal rate.  Respiratory: Effort normal and breath sounds normal.  GI: Soft. Bowel sounds are normal.  Musculoskeletal:       Right hip: He exhibits decreased range of motion, decreased strength, tenderness and bony tenderness.  Neurological: He is alert and oriented to person, place, and time.  Skin: Skin is warm and dry.  Psychiatric: He has a normal mood and affect.    Vital signs in last 24 hours: Temp:  [98 F (36.7 C)] 98 F (36.7 C) (11/26 1052) Pulse Rate:  [73] 73 (11/26 1052) Resp:  [18] 18 (11/26 1052) BP: (112)/(70) 112/70 (11/26 1052) SpO2:  [97 %] 97 % (11/26 1052) Weight:  [104.3 kg] 104.3 kg (11/26 1052)  Labs:   Estimated body mass index is 28.75 kg/m as calculated from the following:   Height as of this encounter: 6\' 3"  (1.905 m).   Weight as of this encounter: 104.3 kg.   Imaging Review Plain radiographs demonstrate severe degenerative joint disease of the right hip(s). The bone quality appears to be good for age and reported activity level.    Preoperative templating of the joint replacement has been completed, documented, and submitted to the Operating Room personnel in order to optimize intra-operative equipment management.     Assessment/Plan:  End stage arthritis, right hip(s)  The patient history, physical examination, clinical judgement of the provider and imaging studies are consistent with end stage degenerative joint disease of the right hip(s) and total  hip arthroplasty is deemed medically necessary. The treatment options including medical management, injection therapy, arthroscopy and arthroplasty were discussed at length. The risks and benefits of total hip arthroplasty were presented and reviewed. The risks due to aseptic loosening, infection, stiffness, dislocation/subluxation,  thromboembolic complications and other imponderables were discussed.  The patient acknowledged the explanation, agreed to proceed with the plan and consent was signed. Patient is being admitted for inpatient treatment for surgery, pain control, PT, OT, prophylactic antibiotics, VTE prophylaxis, progressive ambulation and ADL's and discharge planning.The patient is planning to be discharged home with home health services

## 2018-01-04 NOTE — Op Note (Signed)
NAME: ALEKSEY, NEWBERN MEDICAL RECORD ZO:[id number] ACCOUNT [id number] DATE OF BIRTH:03/19/1940 FACILITY: [facility] LOCATION: [facility] PHYSICIAN:CHRISTOPHER Aretha Parrot, MD  OPERATIVE REPORT  DATE OF PROCEDURE:  12/21/2017  PREOPERATIVE DIAGNOSIS:  Primary osteoarthritis and degenerative joint disease, right hip.  POSTOPERATIVE DIAGNOSIS:  Primary osteoarthritis and degenerative joint disease, right hip.  PROCEDURE:  Right total hip arthroplasty through direct anterior approach.  IMPLANTS:  DePuy Sector Gription acetabular component size 58 with a single screw, size 36+0 neutral polyethylene liner, size 12 Corail femoral component with standard offset, size 36+1.5 metal hip ball.  SURGEON:  Vanita Panda. Magnus Ivan, MD  ASSISTANT:  Richardean Canal, PA-C.  ANESTHESIA:  Spinal.  ANTIBIOTICS:  Two grams IV Ancef.  ESTIMATED BLOOD LOSS:  250 mL.  COMPLICATIONS:  None.  INDICATIONS:  The patient is a very pleasant 77 year old gentleman with debilitating arthritis involving both his hips.  His right hip is much worse than the left radiographically.  The left has been hurting him more recently.  He has a significant leg  length discrepancy with the right operative side today being actually shorter than his left leg.  He actually cannot tell much of a difference and I think some of this is related to his pain and him just being a cantankerous gentleman.  He understands  that we recommended total hip arthroplasty surgery and he does wish to proceed with this.  We had a long and thorough discussion about the risk of acute blood loss anemia, nerve or vessel injury, fracture, infection, dislocation, DVT and implant failure.   He understands our goals are to decrease pain, improve mobility and overall improve quality of life.  DESCRIPTION OF PROCEDURE:  After informed consent was obtained and appropriate right hip was marked.  He was brought to the operating room, sat up on a stretcher where spinal  anesthesia was obtained.  He was then laid in the supine position on a  stretcher.  Foley catheter was placed.  I assessed his leg length again and he is definitely shorter on his right side considerably comparing the right and left sides.  Traction boots were placed on both his feet.  Next he was placed supine on the Hana  fracture table with a perineal post in place and both legs in line skeletal traction device and no traction applied.  His right operative hip was prepped and draped with DuraPrep and sterile drapes.  A time-out was called and he was identified as correct  patient, correct right hip.  We then made an incision just inferior and posterior to the anterior superior iliac spine and carried this obliquely down the leg.  We dissected down tensor fascia lata muscle.  Tensor fascia was then divided longitudinally  to proceed with direct anterior approach to the hip.  We identified and cauterized circumflex vessels and identified the hip capsule, opened the hip capsule in an L-type format, finding moderate joint effusion and significant periarticular osteophytes  all around the femoral head and neck.  We placed Cobra retractors around the medial and lateral femoral neck.  I released the lateral joint capsule and actually the labrum.  We made a femoral neck cut with an oscillating saw just proximal to the lesser  trochanter and completed this with an osteotome.  We placed a corkscrew guide in the femoral head and removed the femoral head in its entirety and found a wide area devoid of cartilage and very large femoral head.  I then placed a bent Hohmann over the  medial  acetabular rim and removed remnants of the acetabular labrum and other debris and then began reaming under direct visualization in stepwise increments from a size 44 reamer going all the way up to a size 57 with all reamers under direct  visualization, the last reamer under direct fluoroscopy, so we could obtain our depth of reaming,  our inclination and anteversion.  I then placed the real DePuy Sector Gription acetabular component size 58 and a single screw.  We placed a 36+0 acetabular  liner for that size acetabular component.  Attention was then turned to the femur.  With the leg externally rotated to 120 degrees, extended and adducted, we were able to place the Mueller retractor medially and Hohman retractor behind the greater  trochanter, released lateral joint capsule and used a box-cutting osteotome to enter femoral canal and a rongeur to lateralize and then began broaching from a size 8 broach using Corail broaching system going up to a size 12.  With a size 12 in place, we  trialed a standard offset femoral neck and a 36-2 hip ball considering the fact that he started off so short, so we can make an assessment.  We reduced this in the acetabulum and we felt like he needed a little bit more leg length.  We removed the trial  components and then placed the real Corail femoral component size 12, which did hang up quite a bit, so we went with a 36+1.5 hip ball, reduced this in the acetabulum, which was very tight.  We felt like we reproduced his leg lengths and he felt stable.   We then irrigated the soft tissue with normal saline solution.  We reassessed the hip under direct visualization and fluoroscopy and we were pleased with the implant positioning.  We then closed the joint capsule with interrupted #1 Ethibond suture,  followed by running 0 Vicryl and tensor fascia, 0 Vicryl in the deep tissue, 2-0 Vicryl subcutaneous tissue, 4-0 Monocryl subcuticular stitch and Steri-Strips on the skin.  An Aquacel dressing was applied.  He was taken off the Hana table and taken to  recovery room in stable condition.  All final counts were correct.  There were no complications noted.  Of note, Rexene EdisonGil Clark, PA-C, assisted the entire case.  Assistance was crucial for facilitating all aspects of this case.  TN/NUANCE  D:01/04/2018 T:01/04/2018  JOB:[id number]

## 2018-01-04 NOTE — Anesthesia Postprocedure Evaluation (Signed)
Anesthesia Post Note  Patient: Eric Gallagher  Procedure(s) Performed: RIGHT TOTAL HIP ARTHROPLASTY ANTERIOR APPROACH (Right Hip)     Patient location during evaluation: PACU Anesthesia Type: Spinal Level of consciousness: oriented and awake and alert Pain management: pain level controlled Vital Signs Assessment: post-procedure vital signs reviewed and stable Respiratory status: spontaneous breathing, respiratory function stable and nonlabored ventilation Cardiovascular status: blood pressure returned to baseline and stable Postop Assessment: no headache, no backache, no apparent nausea or vomiting and spinal receding Anesthetic complications: no    Last Vitals:  Vitals:   01/04/18 1535 01/04/18 2025  BP: 99/69 123/61  Pulse: 60 71  Resp: 16 18  Temp: (!) 36.3 C 36.4 C  SpO2: 97% 97%    Last Pain:  Vitals:   01/04/18 2025  TempSrc: Oral  PainSc:                  Affan Callow A.

## 2018-01-04 NOTE — Transfer of Care (Signed)
Immediate Anesthesia Transfer of Care Note  Patient: Eric Gallagher  Procedure(s) Performed: RIGHT TOTAL HIP ARTHROPLASTY ANTERIOR APPROACH (Right Hip)  Patient Location: PACU  Anesthesia Type:MAC  Level of Consciousness: awake, patient cooperative and responds to stimulation  Airway & Oxygen Therapy: Patient Spontanous Breathing and Patient connected to face mask oxygen  Post-op Assessment: Report given to RN and Post -op Vital signs reviewed and stable  Post vital signs: Reviewed and stable  Last Vitals:  Vitals Value Taken Time  BP 91/61 01/04/2018  2:12 PM  Temp    Pulse 76 01/04/2018  2:13 PM  Resp 22 01/04/2018  2:13 PM  SpO2 99 % 01/04/2018  2:13 PM  Vitals shown include unvalidated device data.  Last Pain:  Vitals:   01/04/18 1125  TempSrc:   PainSc: 4          Complications: No apparent anesthesia complications

## 2018-01-04 NOTE — Anesthesia Procedure Notes (Signed)
Spinal  Start time: 01/04/2018 12:25 PM End time: 01/04/2018 12:32 PM Staffing Anesthesiologist: Mal AmabileFoster, Dhanush Jokerst, MD Performed: anesthesiologist  Preanesthetic Checklist Completed: patient identified, site marked, surgical consent, pre-op evaluation, timeout performed, IV checked, risks and benefits discussed and monitors and equipment checked Spinal Block Patient position: right lateral decubitus Prep: site prepped and draped and DuraPrep Patient monitoring: continuous pulse ox, blood pressure and cardiac monitor Approach: midline Location: L3-4 Injection technique: single-shot Needle Needle type: Pencan  Needle gauge: 24 G Needle length: 9 cm Needle insertion depth: 7 cm Assessment Sensory level: T6 Additional Notes Patient tolerated procedure well. Adequate sensory level.

## 2018-01-04 NOTE — Plan of Care (Signed)

## 2018-01-04 NOTE — Progress Notes (Signed)
ANTICOAGULATION CONSULT NOTE - Initial Consult  Pharmacy Consult for coumadin Indication: atrial fibrillation  Allergies  Allergen Reactions  . Flomax [Tamsulosin Hcl] Other (See Comments)    Dizzy    Patient Measurements: Height: 6\' 3"  (190.5 cm) Weight: 230 lb (104.3 kg) IBW/kg (Calculated) : 84.5 Heparin Dosing Weight:   Vital Signs: Temp: 97.3 F (36.3 C) (11/26 1520) Temp Source: Oral (11/26 1052) BP: 137/80 (11/26 1520) Pulse Rate: 63 (11/26 1520)  Labs: Recent Labs    01/04/18 1050  LABPROT 15.3*  INR 1.22  CREATININE 0.97    Estimated Creatinine Clearance: 82 mL/min (by C-G formula based on SCr of 0.97 mg/dL).   Medical History: Past Medical History:  Diagnosis Date  . Anginal pain (HCC)    h/o   . Arrhythmia    Chronic AFIB, no anti-coag patient desire and low platelet count  . Arthritis    both hips   . BPH (benign prostatic hyperplasia)   . Chronic a-fib   . Coronary artery disease    NSTEMI in 2005, Taxus DES LAD  . Diabetes mellitus without complication (HCC)    type II, some neuropathy  . Dysrhythmia    afib  . ED (erectile dysfunction)   . History of kidney stones 2012   passed spontaneously  . Hyperlipidemia   . Hypertension   . Old MI (myocardial infarction)   . Stroke (HCC) 2017  . Thrombocytopenia (HCC)    chronic    Medications:  Medications Prior to Admission  Medication Sig Dispense Refill Last Dose  . alfuzosin (UROXATRAL) 10 MG 24 hr tablet Take 10 mg by mouth at bedtime.    01/03/2018 at Unknown time  . amLODipine (NORVASC) 5 MG tablet Take 5 mg by mouth daily before breakfast.    01/04/2018 at 0830  . benazepril (LOTENSIN) 20 MG tablet Take 20 mg by mouth at bedtime.    01/03/2018 at Unknown time  . enoxaparin (LOVENOX) 100 MG/ML injection Inject 100 mg into the skin 2 (two) times daily.   01/03/2018 at 9pm  . glipiZIDE (GLUCOTROL) 5 MG tablet Take 2.5 mg by mouth at bedtime.   01/03/2018 at Unknown time  . metFORMIN  (GLUCOPHAGE) 1000 MG tablet Take 1,000 mg by mouth 2 (two) times daily with a meal.   01/03/2018 at Unknown time  . metoprolol (LOPRESSOR) 100 MG tablet Take 100 mg by mouth 2 (two) times daily.   01/04/2018 at 830am  . simvastatin (ZOCOR) 40 MG tablet Take 40 mg by mouth every evening.   01/03/2018 at Unknown time  . ACCU-CHEK AVIVA PLUS test strip Use as directed weekly.   Taking  . ACCU-CHEK SOFTCLIX LANCETS lancets Use as directed weekly.   Taking  . nitroGLYCERIN (NITROSTAT) 0.4 MG SL tablet Place 0.4 mg under the tongue every 5 (five) minutes as needed for chest pain.   Unknown at prn  . warfarin (COUMADIN) 5 MG tablet Take 5 mg by mouth at bedtime. DO NOT take on Saturdays.   12/29/2017   Scheduled:    Assessment: Eric Gallagher is s/p THA. He was on coumadin pre-op for his afib and CVA. His INR was 1.26 on 11/26. Coumadin has been re-ordered post-op.   Coumadin PTA = 5mg  qday  Goal of Therapy:  INR 2-3 Monitor platelets by anticoagulation protocol: Yes   Plan:   Coumadin 7.5mg  PO x1 Daily INR  Eric Gallagher, PharmD, [place]IDP, AAHIVP, CPP Infectious Disease Pharmacist 01/04/2018 3:38 PM

## 2018-01-04 NOTE — Brief Op Note (Signed)
01/04/2018  1:56 PM  PATIENT:  Eric Gallagher  77 y.o. male  PRE-OPERATIVE DIAGNOSIS:  osteoarthritis right hip  POST-OPERATIVE DIAGNOSIS:  osteoarthritis right hip  PROCEDURE:  Procedure(s): RIGHT TOTAL HIP ARTHROPLASTY ANTERIOR APPROACH (Right)  SURGEON:  Surgeon(s) and Role:    Kathryne Hitch* Hendel Gatliff Y, MD - Primary  PHYSICIAN ASSISTANT: Rexene EdisonGil Clark, PA-C  ANESTHESIA:   spinal  EBL:  250 mL   COUNTS:  YES  TOURNIQUET:  * No tourniquets in log *  DICTATION: .Other Dictation: Dictation Number [id number]  PLAN OF CARE: Admit to inpatient   PATIENT DISPOSITION:  PACU - hemodynamically stable.   Delay start of Pharmacological VTE agent (>24hrs) due to surgical blood loss or risk of bleeding: no

## 2018-01-05 ENCOUNTER — Encounter (HOSPITAL_COMMUNITY): Payer: Self-pay | Admitting: Orthopaedic Surgery

## 2018-01-05 ENCOUNTER — Other Ambulatory Visit: Payer: Self-pay

## 2018-01-05 LAB — BASIC METABOLIC PANEL
ANION GAP: 8 (ref 5–15)
BUN: 15 mg/dL (ref 8–23)
CHLORIDE: 101 mmol/L (ref 98–111)
CO2: 24 mmol/L (ref 22–32)
Calcium: 8.4 mg/dL — ABNORMAL LOW (ref 8.9–10.3)
Creatinine, Ser: 1.08 mg/dL (ref 0.61–1.24)
GFR calc non Af Amer: >60 mL/min (ref >60)
Glucose, Bld: 233 mg/dL — ABNORMAL HIGH (ref 70–99)
Potassium: 4.3 mmol/L (ref 3.5–5.1)
Sodium: 133 mmol/L — ABNORMAL LOW (ref 135–145)

## 2018-01-05 LAB — PROTIME-INR
INR: 1.36
Prothrombin Time: 16.6 seconds — ABNORMAL HIGH (ref 11.4–15.2)

## 2018-01-05 LAB — CBC
HEMATOCRIT: 35.9 % — AB (ref 39.0–52.0)
HEMOGLOBIN: 11.6 g/dL — AB (ref 13.0–17.0)
MCH: 30.9 pg (ref 26.0–34.0)
MCHC: 32.3 g/dL (ref 30.0–36.0)
MCV: 95.5 fL (ref 80.0–100.0)
NRBC: 0 % (ref 0.0–0.2)
Platelets: 110 10*3/uL — ABNORMAL LOW (ref 150–400)
RBC: 3.76 MIL/uL — AB (ref 4.22–5.81)
RDW: 13.4 % (ref 11.5–15.5)
WBC: 7.6 10*3/uL (ref 4.0–10.5)

## 2018-01-05 LAB — GLUCOSE, CAPILLARY: Glucose-Capillary: 193 mg/dL — ABNORMAL HIGH (ref 70–99)

## 2018-01-05 MED ORDER — PHENYLEPHRINE 40 MCG/ML (10ML) SYRINGE FOR IV PUSH (FOR BLOOD PRESSURE SUPPORT)
PREFILLED_SYRINGE | INTRAVENOUS | Status: AC
  Filled 2018-01-05: qty 10

## 2018-01-05 MED ORDER — WARFARIN SODIUM 7.5 MG PO TABS
7.5000 mg | ORAL_TABLET | Freq: Once | ORAL | Status: AC
  Administered 2018-01-05: 7.5 mg via ORAL
  Filled 2018-01-05: qty 1

## 2018-01-05 MED ORDER — SODIUM CHLORIDE 0.9 % IV BOLUS
500.0000 mL | Freq: Once | INTRAVENOUS | Status: AC
  Administered 2018-01-05: 500 mL via INTRAVENOUS

## 2018-01-05 NOTE — Evaluation (Signed)
Physical Therapy Evaluation Patient Details Name: Eric Gallagher MRN: [id number] DOB: 09-Dec-1940 Today's Date: 01/05/2018   History of Present Illness  77yo male who received elective R THR via direct anterior approach on 01/04/18. PMH A-fib, DM, HTN, MI, CVA, hx cardiac cath   Clinical Impression   Patient received in bed, very pleasant and willing to participate in therapy this morning. Educated on hip precautions and weight bearing status and then required [place] to come from supine to sit at EOB, however was able to perform sit to stand with MinA from elevated surface with RW and could maintain balance for brief periods without B UE support to assist in donning jogging pants. Able to gait train approximately 20f in room with RW and Mod cues for sequencing and safety, limited by pain and dizziness at end of gait period. SpO2 96% on room air, BP 99/51, RN aware of patient status and PT plan to check orthostatics later in day. He was left up in chair with dizziness resolved, all needs otherwise met, and family present. He will continue to benefit from skilled PT services in the acute setting as well as ongoing skilled PT services per surgeon's recommendation moving forward.     Follow Up Recommendations Follow surgeon's recommendation for DC plan and follow-up therapies    Equipment Recommendations  Rolling walker with 5" wheels;3in1 (PT);Other (comment)(tub seat )    Recommendations for Other Services       Precautions / Restrictions Precautions Precautions: Fall;Anterior Hip Precaution Booklet Issued: No Precaution Comments: direct anterior THA  Restrictions Weight Bearing Restrictions: Yes RLE Weight Bearing: Weight bearing as tolerated      Mobility  Bed Mobility Overal bed mobility: Needs Assistance Bed Mobility: Supine to Sit     Supine to sit: Mod assist     General bed mobility comments: ModA to pivot to EOB and elevate trunk   Transfers Overall transfer level: Needs  assistance Equipment used: Rolling walker (2 wheeled) Transfers: Sit to/from Stand Sit to Stand: Min assist;From elevated surface         General transfer comment: light MinA to boost to full standing position from elevated surface, extended time to transfer hands to grips of RW but able to mUrosurgical [facility] Of [facility] Northbalance with no UE support to pull up pants standing in front of bed   Ambulation/Gait Ambulation/Gait assistance: Min guard Gait Distance (Feet): 15 Feet Assistive device: Rolling walker (2 wheeled) Gait Pattern/deviations: Step-to pattern;Decreased step length - left;Decreased stance time - right;Decreased stride length;Decreased dorsiflexion - right;Decreased weight shift to right;Antalgic Gait velocity: decreased    General Gait Details: antalgic, slow gait pattern with Mod cues for sequencing and difficulty in clearing R LE during swing phase   Stairs            Wheelchair Mobility    Modified Rankin (Stroke Patients Only)       Balance Overall balance assessment: Needs assistance Sitting-balance support: Bilateral upper extremity supported;Feet supported Sitting balance-Leahy Scale: Good     Standing balance support: Bilateral upper extremity supported;No upper extremity supported Standing balance-Leahy Scale: Fair Standing balance comment: able to maintain balance without UE support to pull up pants at bedside                              Pertinent Vitals/Pain Pain Assessment: No/denies pain    Home Living Family/patient expects to be discharged to:: Private residence Living Arrangements: Spouse/significant other;Children Available Help at  Discharge: Family;Available PRN/intermittently Type of Home: House Home Access: Stairs to enter Entrance Stairs-Rails: None Entrance Stairs-Number of Steps: 2 steps in front, 3 in carport no railings  Home Layout: One level Home Equipment: [place] - 2 wheels;Cane - single point Additional Comments: reports his  wife fell and fractured her leg and the surgery she had has really changed her cognitive status, he has been her caregiver     Prior Function Level of Independence: Independent with assistive device(s)               Hand Dominance        Extremity/Trunk Assessment   Upper Extremity Assessment Upper Extremity Assessment: Defer to OT evaluation    Lower Extremity Assessment Lower Extremity Assessment: Generalized weakness    Cervical / Trunk Assessment Cervical / Trunk Assessment: Normal  Communication   Communication: HOH  Cognition Arousal/Alertness: Awake/alert Behavior During Therapy: WFL for tasks assessed/performed Overall Cognitive Status: Within Functional Limits for tasks assessed                                        General Comments General comments (skin integrity, edema, etc.): dizzy at EOS in chair, SpO2 on room air 96% and BP 99/51, RN aware and PT plan to check orthostatics during PM session     Exercises     Assessment/Plan    PT Assessment Patient needs continued PT services  PT Problem List Decreased strength;Decreased knowledge of use of DME;Decreased activity tolerance;Decreased safety awareness;Decreased balance;Decreased knowledge of precautions;Decreased mobility;Decreased coordination       PT Treatment Interventions DME instruction;Balance training;Gait training;Neuromuscular re-education;Stair training;Functional mobility training;Patient/family education;Therapeutic activities;Therapeutic exercise;Manual techniques    PT Goals (Current goals can be found in the Care Plan section)  Acute Rehab PT Goals Patient Stated Goal: go home today if possible  PT Goal Formulation: With patient Time For Goal Achievement: 01/19/18 Potential to Achieve Goals: Fair    Frequency 7X/week   Barriers to discharge        Co-evaluation               AM-PAC PT "6 Clicks" Mobility  Outcome Measure Help needed turning from  your back to your side while in a flat bed without using bedrails?: A Little Help needed moving from lying on your back to sitting on the side of a flat bed without using bedrails?: A Lot Help needed moving to and from a bed to a chair (including a wheelchair)?: A Little Help needed standing up from a chair using your arms (e.g., wheelchair or bedside chair)?: A Little Help needed to walk in hospital room?: A Little Help needed climbing 3-5 steps with a railing? : A Lot 6 Click Score: 16    End of Session   Activity Tolerance: Patient tolerated treatment well Patient left: in chair;with call bell/phone within reach;with family/visitor present Nurse Communication: Mobility status;Other (comment)(BP and dizziness at EOS now resolving ) PT Visit Diagnosis: Unsteadiness on feet (R26.81);Difficulty in walking, not elsewhere classified (R26.2);Muscle weakness (generalized) (M62.81)    Time: 0935-1010 PT Time Calculation (min) (ACUTE ONLY): 35 min   Charges:   PT Evaluation $PT Eval Low Complexity: 1 Low PT Treatments $Gait Training: 8-22 mins        Deniece Ree PT, DPT, CBIS  Supplemental Physical Therapist [place]    Pager [phone] Acute Rehab Office [phone]

## 2018-01-05 NOTE — Progress Notes (Signed)
ANTICOAGULATION CONSULT NOTE  Pharmacy Consult for Coumadin Indication: atrial fibrillation  Allergies  Allergen Reactions  . Flomax [Tamsulosin Hcl] Other (See Comments)    Dizzy    Patient Measurements: Height: 6\' 3"  (190.5 cm) Weight: 230 lb (104.3 kg) IBW/kg (Calculated) : 84.5  Vital Signs: Temp: 97.9 F (36.6 C) (11/27 0556) Temp Source: Oral (11/27 0556) BP: 97/64 (11/27 0556) Pulse Rate: 81 (11/27 0556)  Labs: Recent Labs    01/04/18 1050 01/05/18 0222  HGB  --  11.6*  HCT  --  35.9*  PLT  --  110*  LABPROT 15.3* 16.6*  INR 1.22 1.36  CREATININE 0.97 1.08    Estimated Creatinine Clearance: 73.7 mL/min (by C-G formula based on SCr of 1.08 mg/dL).   Assessment: Eric Gallagher is s/p right THA. He was on Coumadin pre-op for his afib and CVA. Coumadin has been re-ordered post-op. INR subtherapeutic at 1.36. No bleeding noted, Hgb down to 11.6, platelets down to 110.  Coumadin PTA = 5mg  qday except none on Sat  Goal of Therapy:  INR 2-3 Monitor platelets by anticoagulation protocol: Yes   Plan:  Repeat Coumadin 7.5 mg PO x1 tonight  Recommend resume home dose tomorrow Daily INR Monitor for s/sx of bleeding   Loura BackJennifer Guy, PharmD, BCPS Clinical Pharmacist Clinical phone for 01/05/2018 until 3p is [phone] 01/05/2018 8:02 AM  **Pharmacist phone directory can now be found on [email or web address] listed under [facility]**

## 2018-01-05 NOTE — Evaluation (Signed)
Occupational Therapy Evaluation Patient Details Name: Eric Gallagher MRN: [id number] DOB: 23-Sep-1940 Today's Date: 01/05/2018    History of Present Illness 77yo male who received elective R THR via direct anterior approach on 01/04/18. PMH A-fib, DM, HTN, MI, CVA, hx cardiac cath    Clinical Impression   PTA, pt was living with his wife and was independent. Pt currently requiring Mod A for LB ADLs and Min A for functional mobility with RW. Pt highly motivated and wanting to dc home today. Pt with syncopal episode during functional mobility from bathroom. RN and MD made aware and present. See general comments for BPs. Pt would benefit from further acute OT to facilitate safe dc. Pending medical status and BP, recommend dc to home with HHOT for further OT to optimize safety, independence with ADLs, and return to PLOF.    Follow Up Recommendations  Home health OT;Supervision/Assistance - 24 hour    Equipment Recommendations  3 in 1 bedside commode    Recommendations for Other Services PT consult     Precautions / Restrictions Precautions Precautions: Fall;Anterior Hip Precaution Booklet Issued: No Precaution Comments: direct anterior THA  Restrictions Weight Bearing Restrictions: Yes RLE Weight Bearing: Weight bearing as tolerated      Mobility Bed Mobility               General bed mobility comments: In recliner upon arrival  Transfers Overall transfer level: Needs assistance Equipment used: Rolling walker (2 wheeled) Transfers: Sit to/from Stand Sit to Stand: Min assist;From elevated surface         General transfer comment: Min A to power up into standing and then gain balance.     Balance Overall balance assessment: Needs assistance Sitting-balance support: Bilateral upper extremity supported;Feet supported Sitting balance-Leahy Scale: Good     Standing balance support: Bilateral upper extremity supported;No upper extremity supported Standing balance-Leahy  Scale: Fair Standing balance comment: able to maintain balance without UE support to pull up pants at bedside                            ADL either performed or assessed with clinical judgement   ADL Overall ADL's : Needs assistance/impaired Eating/Feeding: Independent;Sitting   Grooming: Supervision/safety;Set up;Sitting   Upper Body Bathing: Supervision/ safety;Set up;Sitting   Lower Body Bathing: Minimal assistance   Upper Body Dressing : Supervision/safety;Set up;Sitting   Lower Body Dressing: Sit to/from stand;Moderate assistance Lower Body Dressing Details (indicate cue type and reason): Pt unable to reach to adjust socks Toilet Transfer: Minimal assistance;BSC;Ambulation;Requires wide/bariatric(over toilet)           Functional mobility during ADLs: Minimal assistance;Rolling walker General ADL Comments: Pt demonstrating high motivation to go home and requiring increased assistance for LB ADLs due to pain with bending forward and BP. During functional mobility to/from bathroom, pt with syncopal episode. elevating BLEs and reclining back. RN and MD aware and present at end. See general comment.     Vision         Perception     Praxis      Pertinent Vitals/Pain Pain Assessment: No/denies pain     Hand Dominance Right   Extremity/Trunk Assessment Upper Extremity Assessment Upper Extremity Assessment: Overall WFL for tasks assessed   Lower Extremity Assessment Lower Extremity Assessment: Defer to PT evaluation   Cervical / Trunk Assessment Cervical / Trunk Assessment: Normal   Communication Communication Communication: HOH   Cognition Arousal/Alertness: Awake/alert Behavior During  Therapy: WFL for tasks assessed/performed Overall Cognitive Status: Within Functional Limits for tasks assessed                                 General Comments: Pt WFL throughout. Passing out at end of session and presenting with decreased awarness  and memory of event   General Comments  BP sitting in recliner: 86/53. after standing and mobility to toilet: 79/62. After sitting for several minutes: 80/54. after mobility to recliner and with syncopal episode: 68/49. Elevating LEs and reclining seat: 108/60. after several minutes reclined: 114/53.    Exercises     Shoulder Instructions      Home Living Family/patient expects to be discharged to:: Private residence Living Arrangements: Spouse/significant other;Children Available Help at Discharge: Family;Available PRN/intermittently Type of Home: House Home Access: Stairs to enter [employer] of Steps: 2 steps in front, 3 in carport no railings  Entrance Stairs-Rails: None Home Layout: One level     Bathroom Shower/Tub: Tub/shower unit;Walk-in shower   Bathroom Toilet: Handicapped height     Home Equipment: Environmental consultant - 2 wheels;Cane - single point   Additional Comments: reports his wife fell and fractured her leg and the surgery she had has really changed her cognitive status, he has been her caregiver       Prior Functioning/Environment Level of Independence: Independent with assistive device(s)                 OT Problem List: Decreased strength;Decreased range of motion;Decreased activity tolerance;Impaired balance (sitting and/or standing);Decreased knowledge of use of DME or AE;Decreased knowledge of precautions;Pain      OT Treatment/Interventions: Self-care/ADL training;Therapeutic exercise;Energy conservation;DME and/or AE instruction;Therapeutic activities;Patient/family education    OT Goals(Current goals can be found in the care plan section) Acute Rehab OT Goals Patient Stated Goal: go home today if possible  OT Goal Formulation: With patient Time For Goal Achievement: 01/19/18 Potential to Achieve Goals: Good  OT Frequency: Min 3X/week   Barriers to D/C:            Co-evaluation              AM-PAC OT "6 Clicks" Daily Activity      Outcome Measure Help from another person eating meals?: None Help from another person taking care of personal grooming?: A Little Help from another person toileting, which includes using toliet, bedpan, or urinal?: A Little Help from another person bathing (including washing, rinsing, drying)?: A Little Help from another person to put on and taking off regular upper body clothing?: A Little Help from another person to put on and taking off regular lower body clothing?: A Lot 6 Click Score: 18   End of Session Equipment Utilized During Treatment: Gait belt;Rolling walker Nurse Communication: Mobility status;Precautions;Other (comment)(Syncopal episode)  Activity Tolerance: Patient tolerated treatment well Patient left: in chair;with call bell/phone within reach;with family/visitor present  OT Visit Diagnosis: Unsteadiness on feet (R26.81);Other abnormalities of gait and mobility (R26.89);Muscle weakness (generalized) (M62.81);Other symptoms and signs involving cognitive function;Pain Pain - Right/Left: Right Pain - part of body: Hip                Time: 6045-4098 OT Time Calculation (min): 51 min Charges:  OT General Charges $OT Visit: 1 Visit OT Evaluation $OT Eval Moderate Complexity: 1 Mod OT Treatments $Self Care/Home Management : 23-37 mins  Dalton Mille MSOT, OTR/L Acute Rehab Pager: [phone] Office: [phone]  Luan Moore  M Ilay Capshaw 01/05/2018, 4:44 PM

## 2018-01-05 NOTE — Care Management Note (Signed)
Case Management Note  Patient Details  Name: Eric Gallagher MRN: [id number] Date of Birth: 06/08/1940  Subjective/Objective:                    Action/Plan:  [facility]  Requested DME RW and 3/1 to be delivered to room 11/27.   Expected Discharge Date:                  Expected Discharge Plan:  Home w Home Health Services  In-House Referral:     Discharge planning Services  CM Consult  Post Acute Care Choice:  Home Health, Durable Medical Equipment Choice offered to:  Patient  DME Arranged:  3-N-1, Walker rolling DME Agency:  [facility]  HH Arranged:  PT HH [facility]  Status of Service:  Completed, signed off  If discussed at [employer] Length of Stay Meetings, dates discussed:    Additional Comments:  Lawerance SabalDebbie Dennice Tindol, RN 01/05/2018, 1:17 PM

## 2018-01-05 NOTE — Progress Notes (Signed)
Physical Therapy Treatment Patient Details Name: Eric Gallagher MRN: [id number] DOB: 06-21-1940 Today's Date: 01/05/2018    History of Present Illness 77yo male who received elective R THR via direct anterior approach on 01/04/18. PMH A-fib, DM, HTN, MI, CVA, hx cardiac cath     PT Comments    Patient received up in chair, BP 124/97, and RN present during all of session this afternoon. Able to perform transfers with MinA and RW, cues for safety and sequencing, and stood for extended period of time while attempting to urinate with urinal- unsuccessful but RN aware and assisted in this process. Performed stand-pivot transfer to bed with min guard and RW, and returned to supine position and left in bed with RN/nurse tech present, all needs otherwise met this afternoon.    Follow Up Recommendations  Follow surgeon's recommendation for DC plan and follow-up therapies     Equipment Recommendations  Rolling walker with 5" wheels;3in1 (PT);Other (comment)    Recommendations for Other Services       Precautions / Restrictions Precautions Precautions: Fall;Anterior Hip Precaution Booklet Issued: No Precaution Comments: direct anterior THA  Restrictions Weight Bearing Restrictions: Yes RLE Weight Bearing: Weight bearing as tolerated    Mobility  Bed Mobility               General bed mobility comments: In recliner upon arrival, [place] for sit to supine   Transfers Overall transfer level: Needs assistance Equipment used: Rolling walker (2 wheeled) Transfers: Sit to/from [facility] Sit to Stand: Min assist         General transfer comment: MinA to power up and gain balance wtih RW, cues for sequencing and safety, hand placement; Min guard for stand-pivot transfer to bed   Ambulation/Gait                 Stairs             Wheelchair Mobility    Modified Rankin (Stroke Patients Only)       Balance Overall balance assessment: Needs  assistance Sitting-balance support: Bilateral upper extremity supported;Feet supported Sitting balance-Leahy Scale: Good     Standing balance support: Bilateral upper extremity supported;No upper extremity supported Standing balance-Leahy Scale: Fair Standing balance comment: able to maintain balance without UE support to pull up pants at bedside                             Cognition Arousal/Alertness: Awake/alert Behavior During Therapy: WFL for tasks assessed/performed Overall Cognitive Status: Within Functional Limits for tasks assessed                                 General Comments: WFL, slight impairment in short term memory but able to recall PT/events of PT session from this morning       Exercises      General Comments General comments (skin integrity, edema, etc.): BP sitting in recliner: 86/53. after standing and mobility to toilet: 79/62. After sitting for several minutes: 80/54. after mobility to recliner and with syncopal episode: 68/49. Elevating LEs and reclining seat: 108/60. after several minutes reclined: 114/53.      Pertinent Vitals/Pain Pain Assessment: 0-10 Pain Score: 4  Pain Location: L hip  Pain Descriptors / Indicators: Aching;Sore Pain Intervention(s): Limited activity within patient's tolerance;Monitored during session;Patient requesting pain meds-RN notified    Home Living Family/patient expects  to be discharged to:: Private residence Living Arrangements: Spouse/significant other;Children Available Help at Discharge: Family;Available PRN/intermittently Type of Home: House Home Access: Stairs to enter Entrance Stairs-Rails: None Home Layout: One level Home Equipment: Environmental consultant - 2 wheels;Cane - single point Additional Comments: reports his wife fell and fractured her leg and the surgery she had has really changed her cognitive status, he has been her caregiver     Prior Function Level of Independence: Independent with  assistive device(s)          PT Goals (current goals can now be found in the care plan section) Acute Rehab PT Goals Patient Stated Goal: go home today if possible  PT Goal Formulation: With patient Time For Goal Achievement: 01/19/18 Potential to Achieve Goals: Fair Progress towards PT goals: Progressing toward goals    Frequency    7X/week      PT Plan Current plan remains appropriate    Co-evaluation              AM-PAC PT "6 Clicks" Mobility   Outcome Measure  Help needed turning from your back to your side while in a flat bed without using bedrails?: A Little Help needed moving from lying on your back to sitting on the side of a flat bed without using bedrails?: A Lot Help needed moving to and from a bed to a chair (including a wheelchair)?: A Little Help needed standing up from a chair using your arms (e.g., wheelchair or bedside chair)?: A Little Help needed to walk in hospital room?: A Little Help needed climbing 3-5 steps with a railing? : A Lot 6 Click Score: 16    End of Session Equipment Utilized During Treatment: Gait belt Activity Tolerance: Patient tolerated treatment well Patient left: in bed;with nursing/sitter in room   PT Visit Diagnosis: Unsteadiness on feet (R26.81);Difficulty in walking, not elsewhere classified (R26.2);Muscle weakness (generalized) (M62.81)     Time: 0867-6195 PT Time Calculation (min) (ACUTE ONLY): 18 min  Charges:  $Therapeutic Activity: 8-22 mins                     Deniece Ree PT, DPT, CBIS  Supplemental Physical Therapist [place]    Pager [phone] Acute Rehab Office [phone]

## 2018-01-05 NOTE — Plan of Care (Signed)

## 2018-01-05 NOTE — Progress Notes (Signed)
Subjective: 1 Day Post-Op Procedure(s) (LRB): RIGHT TOTAL HIP ARTHROPLASTY ANTERIOR APPROACH (Right) Patient reports pain as mild.  Wants to go home late today.  Objective: Vital signs in last 24 hours: Temp:  [97 F (36.1 C)-98 F (36.7 C)] 97.9 F (36.6 C) (11/27 0556) Pulse Rate:  [60-81] 81 (11/27 0556) Resp:  [16-23] 18 (11/27 0556) BP: (90-137)/(59-80) 97/64 (11/27 0556) SpO2:  [94 %-100 %] 95 % (11/27 0556) Weight:  [104.3 kg] 104.3 kg (11/26 1052)  Intake/Output from previous day: 11/26 0701 - 11/27 0700 In: 1680 [P.O.:440; I.V.:1040; IV Piggyback:150] Out: 500 [Urine:250; Blood:250] Intake/Output this shift: No intake/output data recorded.  Recent Labs    01/05/18 0222  HGB 11.6*   Recent Labs    01/05/18 0222  WBC 7.6  RBC 3.76*  HCT 35.9*  PLT 110*   Recent Labs    01/04/18 1050 01/05/18 0222  NA 138 133*  K 4.3 4.3  CL 104 101  CO2 26 24  BUN 11 15  CREATININE 0.97 1.08  GLUCOSE 165* 233*  CALCIUM 9.4 8.4*   Recent Labs    01/04/18 1050 01/05/18 0222  INR 1.22 1.36    Sensation intact distally Intact pulses distally Dorsiflexion/Plantar flexion intact Incision: dressing C/D/I   Assessment/Plan: 1 Day Post-Op Procedure(s) (LRB): RIGHT TOTAL HIP ARTHROPLASTY ANTERIOR APPROACH (Right) Up with therapy Discharge home with home health late this afternoon.    Kathryne HitchChristopher Y Blackman 01/05/2018, 7:48 AM

## 2018-01-05 NOTE — Progress Notes (Signed)
Patient ID: Eric BellowsDavid G Segovia, male   DOB: 03/28/1940, 77 y.o.   MRN: [id number] The patient has had drops in blood pressure when up and some dizziness.  His H/H this morning was fine with hgb over 11.  Will not discharge him today.  Family at the bedside understand this.

## 2018-01-05 NOTE — Progress Notes (Signed)
In and cath insertion was unsuccessful. MD made aware. Received a new order for coude catheter. RN called 6N certified staff nurse for insertion.

## 2018-01-06 LAB — CBC
HCT: 33.3 % — ABNORMAL LOW (ref 39.0–52.0)
Hemoglobin: 10.7 g/dL — ABNORMAL LOW (ref 13.0–17.0)
MCH: 30.6 pg (ref 26.0–34.0)
MCHC: 32.1 g/dL (ref 30.0–36.0)
MCV: 95.1 fL (ref 80.0–100.0)
NRBC: 0 % (ref 0.0–0.2)
Platelets: 98 10*3/uL — ABNORMAL LOW (ref 150–400)
RBC: 3.5 MIL/uL — ABNORMAL LOW (ref 4.22–5.81)
RDW: 13.7 % (ref 11.5–15.5)
WBC: 9.6 10*3/uL (ref 4.0–10.5)

## 2018-01-06 LAB — PROTIME-INR
INR: 1.52
Prothrombin Time: 18.1 seconds — ABNORMAL HIGH (ref 11.4–15.2)

## 2018-01-06 MED ORDER — WARFARIN SODIUM 7.5 MG PO TABS
7.5000 mg | ORAL_TABLET | Freq: Once | ORAL | Status: AC
  Administered 2018-01-06: 7.5 mg via ORAL
  Filled 2018-01-06: qty 1

## 2018-01-06 NOTE — Progress Notes (Signed)
MD paged regarding BP meds. Ordered to hold amlodipine and give metoprolol.

## 2018-01-06 NOTE — Progress Notes (Signed)
ANTICOAGULATION CONSULT NOTE  Pharmacy Consult for Coumadin Indication: atrial fibrillation  Allergies  Allergen Reactions  . Flomax [Tamsulosin Hcl] Other (See Comments)    Dizzy    Patient Measurements: Height: 6\' 3"  (190.5 cm) Weight: 230 lb (104.3 kg) IBW/kg (Calculated) : 84.5  Vital Signs: Temp: 98 F (36.7 C) (11/28 0315) Temp Source: Oral (11/28 0315) BP: 91/60 (11/28 0315) Pulse Rate: 74 (11/28 0315)  Labs: Recent Labs    01/04/18 1050 01/05/18 0222 01/06/18 0130  HGB  --  11.6* 10.7*  HCT  --  35.9* 33.3*  PLT  --  110* 98*  LABPROT 15.3* 16.6* 18.1*  INR 1.22 1.36 1.52  CREATININE 0.97 1.08  --     Estimated Creatinine Clearance: 73.7 mL/min (by C-G formula based on SCr of 1.08 mg/dL).   Assessment: Onalee HuaDavid is s/p right THA. He was on Coumadin pre-op for his afib and CVA. Coumadin has been re-ordered post-op. INR subtherapeutic at 1.52 today. Hgb down to 10.7, platelets down to 98. Per RN bleeding due to catheter insertion that has resolved now. Will continue to monitor.   Coumadin PTA = 5mg  qday except none on Sat  Goal of Therapy:  INR 2-3 Monitor platelets by anticoagulation protocol: Yes   Plan:  Repeat Coumadin 7.5 mg PO x1 tonight  Daily INR and CBC Monitor for s/sx of bleeding   Lenward Chancelloranya Makhlouf, PharmD PGY1 Pharmacy Resident Phone [phone] 01/06/2018 8:18 AM  **Pharmacist phone directory can now be found on [email or web address] listed under [facility]**

## 2018-01-06 NOTE — Progress Notes (Signed)
Occupational Therapy Treatment Patient Details Name: Eric BellowsDavid G Gallagher MRN: [id number] DOB: 06/19/1940 Today's Date: 01/06/2018    History of present illness 77yo male who received elective R THR via direct anterior approach on 01/04/18. PMH A-fib, DM, HTN, MI, CVA, hx cardiac cath    OT comments  Took several BPs--see vitals section of chart.  After pt had taken about 5 steps towards BR, he c/o lightheadness.  Took seated BP which dropped 18 points SBP.  Pt had difficulty advancing RLE, started to improve.  Educated on and used AE from EOB   Follow Up Recommendations  Home health OT;Supervision/Assistance - 24 hour(as long as activity tolerance improves and family can manage).  If not, pt may need SNF   Equipment Recommendations  3 in 1 bedside commode    Recommendations for Other Services      Precautions / Restrictions Precautions Precautions: Fall;Anterior Hip Precaution Comments: direct anterior THA  Restrictions Weight Bearing Restrictions: Yes RLE Weight Bearing: Weight bearing as tolerated       Mobility Bed Mobility         Supine to sit: Mod assist     General bed mobility comments: light assist for LLE and assist for trunk with HOB raised  Transfers   Equipment used: Rolling walker (2 wheeled)   Sit to Stand: Min assist         General transfer comment: cues for UE placement. Pt has to have RLE under him    Balance             Standing balance-Leahy Scale: Poor Standing balance comment: cues for posture; tends to lean forward                           ADL either performed or assessed with clinical judgement   ADL               Lower Body Bathing: Minimal assistance;Sit to/from stand;With adaptive equipment       Lower Body Dressing: Minimal assistance;With adaptive equipment;Sit to/from stand                 General ADL Comments: worked with Sports administratorreacher and sock aide from seated position.  Started to walk to BR and pt  c/o dizziness.  Sat in chair.  See vitals section of chart     Vision       Perception     Praxis      Cognition Arousal/Alertness: Awake/alert Behavior During Therapy: WFL for tasks assessed/performed Overall Cognitive Status: Within Functional Limits for tasks assessed                                          Exercises     Shoulder Instructions       General Comments      Pertinent Vitals/ Pain       Pain Score: 7  Pain Location: R hip  Pain Descriptors / Indicators: Aching;Sore Pain Intervention(s): Limited activity within patient's tolerance;Monitored during session;Premedicated before session;Repositioned;Ice applied  Home Living                                          Prior Functioning/Environment  Frequency  Min 3X/week        Progress Toward Goals  OT Goals(current goals can now be found in the care plan section)  Progress towards OT goals: Progressing toward goals(slowly due to dizziness)     Plan      Co-evaluation                 AM-PAC OT "6 Clicks" Daily Activity     Outcome Measure   Help from another person eating meals?: None Help from another person taking care of personal grooming?: A Little Help from another person toileting, which includes using toliet, bedpan, or urinal?: A Little Help from another person bathing (including washing, rinsing, drying)?: A Little Help from another person to put on and taking off regular upper body clothing?: A Little Help from another person to put on and taking off regular lower body clothing?: A Little 6 Click Score: 19    End of Session    OT Visit Diagnosis: Unsteadiness on feet (R26.81);Other abnormalities of gait and mobility (R26.89);Muscle weakness (generalized) (M62.81);Other symptoms and signs involving cognitive function;Pain Pain - Right/Left: Right Pain - part of body: Hip   Activity Tolerance (limited by BP)   Patient  Left in chair;with call bell/phone within reach;with family/visitor present   Nurse Communication Mobility status(BP)        Time: 5621-3086 OT Time Calculation (min): 40 min  Charges: OT General Charges $OT Visit: 1 Visit OT Treatments $Self Care/Home Management : 23-37 mins $Therapeutic Activity: 8-22 mins  Marica Otter, OTR/L Acute Rehabilitation Services [phone] [facility] pager [phone] office 01/06/2018   Eric Gallagher 01/06/2018, 1:19 PM

## 2018-01-06 NOTE — Progress Notes (Signed)
Patient ID: Eric BellowsDavid G Dumm, male   DOB: 08/05/1940, 77 y.o.   MRN: [id number] Feels better today and looks better.  H/H still stable with only mild acute blood loss anemia from his surgery.  Did have to have foley placed last evening.  Son at the bedside says that his dad has been up to the bedside commode this am without issues.  I would prefer him to stay one more day for mobility and safety issues.  Will have foley pulled this afternoon once patient has been up longer.

## 2018-01-06 NOTE — Progress Notes (Signed)
PT reported positive orthostatic hypotension. Refer to flowsheet. Paged Dr. Lajoyce Cornersuda re: BP. Advised to hold all BP meds at this time.

## 2018-01-06 NOTE — Progress Notes (Addendum)
Unable to tolerate long periods of activity. Pt refused to DC foley cath at this time. Nursing previously had multiple failed attempts of I&O cath. Coude Foley placed for urinary retention.

## 2018-01-07 LAB — CBC
HCT: 32.5 % — ABNORMAL LOW (ref 39.0–52.0)
Hemoglobin: 10.1 g/dL — ABNORMAL LOW (ref 13.0–17.0)
MCH: 29.8 pg (ref 26.0–34.0)
MCHC: 31.1 g/dL (ref 30.0–36.0)
MCV: 95.9 fL (ref 80.0–100.0)
PLATELETS: 95 10*3/uL — AB (ref 150–400)
RBC: 3.39 MIL/uL — AB (ref 4.22–5.81)
RDW: 13.8 % (ref 11.5–15.5)
WBC: 9.7 10*3/uL (ref 4.0–10.5)
nRBC: 0 % (ref 0.0–0.2)

## 2018-01-07 LAB — PROTIME-INR
INR: 2.05
PROTHROMBIN TIME: 22.8 s — AB (ref 11.4–15.2)

## 2018-01-07 MED ORDER — METHOCARBAMOL 500 MG PO TABS: 500.0000 mg | ORAL_TABLET | Freq: Four times a day (QID) | ORAL | 1 refills | Status: DC | PRN

## 2018-01-07 MED ORDER — OXYCODONE HCL 5 MG PO TABS: 5.0000 mg | ORAL_TABLET | ORAL | 0 refills | Status: DC | PRN

## 2018-01-07 MED ORDER — WARFARIN SODIUM 5 MG PO TABS
5.0000 mg | ORAL_TABLET | Freq: Once | ORAL | Status: AC
  Administered 2018-01-07: 5 mg via ORAL
  Filled 2018-01-07: qty 1

## 2018-01-07 NOTE — Progress Notes (Signed)
Subjective: 3 Days Post-Op Procedure(s) (LRB): RIGHT TOTAL HIP ARTHROPLASTY ANTERIOR APPROACH (Right) Patient reports pain as moderate.  Blood pressure remained stable with PT today.   Objective: Vital signs in last 24 hours: Temp:  [98.1 F (36.7 C)-98.2 F (36.8 C)] 98.2 F (36.8 C) (11/29 0507) Pulse Rate:  [73-114] 95 (11/29 0928) Resp:  [20] 20 (11/28 1721) BP: (91-141)/(52-65) 141/63 (11/29 0928) SpO2:  [92 %-95 %] 95 % (11/29 0507)  Intake/Output from previous day: 11/28 0701 - 11/29 0700 In: 640 [P.O.:640] Out: 350 [Urine:350] Intake/Output this shift: Total I/O In: 120 [P.O.:120] Out: -   Recent Labs    01/05/18 0222 01/06/18 0130 01/07/18 0123  HGB 11.6* 10.7* 10.1*   Recent Labs    01/06/18 0130 01/07/18 0123  WBC 9.6 9.7  RBC 3.50* 3.39*  HCT 33.3* 32.5*  PLT 98* 95*   Recent Labs    01/05/18 0222  NA 133*  K 4.3  CL 101  CO2 24  BUN 15  CREATININE 1.08  GLUCOSE 233*  CALCIUM 8.4*   Recent Labs    01/06/18 0130 01/07/18 0123  INR 1.52 2.05   Right Lower extremity: Neurovascular intact Incision: scant drainage Compartment soft    Assessment/Plan: 3 Days Post-Op Procedure(s) (LRB): RIGHT TOTAL HIP ARTHROPLASTY ANTERIOR APPROACH (Right) Up with therapy  Remove foley  If remains stable and able to void then discharge home later today.     Eric Gallagher 01/07/2018, 12:15 PM

## 2018-01-07 NOTE — Progress Notes (Signed)
ANTICOAGULATION CONSULT NOTE  Pharmacy Consult:  Coumadin Indication: atrial fibrillation  Allergies  Allergen Reactions  . Flomax [Tamsulosin Hcl] Other (See Comments)    Dizzy    Patient Measurements: Height: 6\' 3"  (190.5 cm) Weight: 230 lb (104.3 kg) IBW/kg (Calculated) : 84.5  Vital Signs: Temp: 98.2 F (36.8 C) (11/29 0507) Temp Source: Oral (11/29 0507) BP: 141/63 (11/29 0928) Pulse Rate: 95 (11/29 0928)  Labs: Recent Labs    01/04/18 1050  01/05/18 0222 01/06/18 0130 01/07/18 0123  HGB  --    < > 11.6* 10.7* 10.1*  HCT  --   --  35.9* 33.3* 32.5*  PLT  --   --  110* 98* 95*  LABPROT 15.3*  --  16.6* 18.1* 22.8*  INR 1.22  --  1.36 1.52 2.05  CREATININE 0.97  --  1.08  --   --    < > = values in this interval not displayed.    Estimated Creatinine Clearance: 73.7 mL/min (by C-G formula based on SCr of 1.08 mg/dL).   Assessment: [age] YOM continues on Coumadin from PTA for history of AFib and VTE prophylaxis s/p THA.  INR increased to therapeutic level today.  RN reported bleeding due to catheter insertion that has resolved.  ABLA noted.  Coumadin PTA = 5mg  PO qday except none on Sat  Goal of Therapy:  INR 2-3 Monitor platelets by anticoagulation protocol: Yes   Plan:  Coumadin 5mg  PO today Daily PT / INR Consider starting SSI if not discharged    Gid Schoffstall D. Laney Potashang, PharmD, BCPS, BCCCP 01/07/2018, 9:35 AM

## 2018-01-07 NOTE — Discharge Summary (Signed)
Patient ID: Eric BellowsDavid G Tout MRN: [id number] DOB/AGE: 78/02/1940 78 y.o.  Admit date: 01/04/2018 Discharge date: 01/07/2018  Admission Diagnoses:  Principal Problem:   Unilateral primary osteoarthritis, right hip Active Problems:   Status post total replacement of right hip   Discharge Diagnoses:  Status post total replacement of right hip Hypotension  Acute blood loss anemia secondary to surgery Urinary retention   Past Medical History:  Diagnosis Date  . Anginal pain (HCC)    h/o   . Arrhythmia    Chronic AFIB, no anti-coag patient desire and low platelet count  . Arthritis    both hips   . BPH (benign prostatic hyperplasia)   . Chronic a-fib   . Coronary artery disease    NSTEMI in 2005, Taxus DES LAD  . Diabetes mellitus without complication (HCC)    type II, some neuropathy  . Dysrhythmia    afib  . ED (erectile dysfunction)   . History of kidney stones 2012   passed spontaneously  . Hyperlipidemia   . Hypertension   . Old MI (myocardial infarction)   . Stroke (HCC) 2017  . Thrombocytopenia (HCC)    chronic    Surgeries: Procedure(s): RIGHT TOTAL HIP ARTHROPLASTY ANTERIOR APPROACH on 01/04/2018   Consultants:   Discharged Condition: Improved  Hospital Course: Eric BellowsDavid G Gallagher is an 77 y.o. male who was admitted 01/04/2018 for operative treatment ofUnilateral primary osteoarthritis, right hip. Patient has severe unremitting pain that affects sleep, daily activities, and work/hobbies. After pre-op clearance the patient was taken to the operating room on 01/04/2018 and underwent  Procedure(s): RIGHT TOTAL HIP ARTHROPLASTY ANTERIOR APPROACH.    Patient was given perioperative antibiotics:  Anti-infectives (From admission, onward)   Start     Dose/Rate Route Frequency Ordered Stop   01/04/18 1900  ceFAZolin (ANCEF) IVPB 1 g/50 mL premix     1 g 100 mL/hr over 30 Minutes Intravenous Every 6 hours 01/04/18 1539 01/05/18 0159   01/04/18 1056  ceFAZolin (ANCEF)  2-4 GM/100ML-% IVPB    Note to Pharmacy:  Sandi RavelingSchonewitz, Leigh   : cabinet override      01/04/18 1056 01/04/18 2259       Patient was given sequential compression devices, early ambulation, and chemoprophylaxis to prevent DVT.  Patient benefited maximally from hospital stay . Hospital stay prolonged due to hypotension and urinary retention.    Recent vital signs:  Patient Vitals for the past 24 hrs:  BP Temp Temp src Pulse Resp SpO2  01/07/18 0928 (!) 141/63 - - 95 - -  01/07/18 0926 116/63 - - 100 - -  01/07/18 0919 104/65 - - 78 - -  01/07/18 0507 (!) 107/57 98.2 F (36.8 C) Oral 73 - 95 %  01/07/18 0100 (!) 100/57 - - 79 - -  01/07/18 0007 (!) 93/52 - - - - -  01/06/18 2300 (!) 91/57 - - 85 - 92 %  01/06/18 1721 120/62 98.1 F (36.7 C) Oral (!) 114 20 95 %     Recent laboratory studies:  Recent Labs    01/05/18 0222 01/06/18 0130 01/07/18 0123  WBC 7.6 9.6 9.7  HGB 11.6* 10.7* 10.1*  HCT 35.9* 33.3* 32.5*  PLT 110* 98* 95*  NA 133*  --   --   K 4.3  --   --   CL 101  --   --   CO2 24  --   --   BUN 15  --   --   CREATININE  1.08  --   --   GLUCOSE 233*  --   --   INR 1.36 1.52 2.05  CALCIUM 8.4*  --   --      Discharge Medications:   Allergies as of 01/07/2018      Reactions   Flomax [tamsulosin Hcl] Other (See Comments)   Dizzy      Medication List    STOP taking these medications   amLODipine 5 MG tablet Commonly known as:  NORVASC   benazepril 20 MG tablet Commonly known as:  LOTENSIN   enoxaparin 100 MG/ML injection Commonly known as:  LOVENOX     TAKE these medications   ACCU-CHEK AVIVA PLUS test strip Generic drug:  glucose blood Use as directed weekly.   ACCU-CHEK SOFTCLIX LANCETS lancets Use as directed weekly.   alfuzosin 10 MG 24 hr tablet Commonly known as:  UROXATRAL Take 10 mg by mouth at bedtime.   glipiZIDE 5 MG tablet Commonly known as:  GLUCOTROL Take 2.5 mg by mouth at bedtime.   metFORMIN 1000 MG tablet Commonly  known as:  GLUCOPHAGE Take 1,000 mg by mouth 2 (two) times daily with a meal.   methocarbamol 500 MG tablet Commonly known as:  ROBAXIN Take 1 tablet (500 mg total) by mouth every 6 (six) hours as needed for muscle spasms.   metoprolol tartrate 100 MG tablet Commonly known as:  LOPRESSOR Take 100 mg by mouth 2 (two) times daily.   nitroGLYCERIN 0.4 MG SL tablet Commonly known as:  NITROSTAT Place 0.4 mg under the tongue every 5 (five) minutes as needed for chest pain.   oxyCODONE 5 MG immediate release tablet Commonly known as:  Oxy IR/ROXICODONE Take 1-2 tablets (5-10 mg total) by mouth every 4 (four) hours as needed for moderate pain (pain score 4-6).   simvastatin 40 MG tablet Commonly known as:  ZOCOR Take 40 mg by mouth every evening.   warfarin 5 MG tablet Commonly known as:  COUMADIN Take 5 mg by mouth at bedtime. DO NOT take on Saturdays.            Durable Medical Equipment  (From admission, onward)         Start     Ordered   01/04/18 1539  DME 3 n 1  Once     01/04/18 1539   01/04/18 1539  DME Walker rolling  Once    Question:  Patient needs a walker to treat with the following condition  Answer:  Status post total replacement of right hip   01/04/18 1539          Diagnostic Studies: Dg Pelvis Portable  Result Date: 01/04/2018 CLINICAL DATA:  Post right hip replacement EXAM: PORTABLE PELVIS 1-2 VIEWS COMPARISON:  Right hip film 11/16/2016 FINDINGS: The acetabular and femoral components of the right total hip replacement appear to be in good position. No complicating features are seen. Some air is noted in the soft tissues postoperatively. Significant degenerative joint disease of the left hip is noted as well. IMPRESSION: 1. Right total hip replacement components in good position. No complicating features. 2. Significant degenerative joint disease of the left hip. Electronically Signed   By: Dwyane Dee M.D.   On: 01/04/2018 14:35   Dg C-arm 1-60  Min  Result Date: 01/04/2018 CLINICAL DATA:  77 year old male for right hip replacement. Initial encounter. EXAM: DG C-ARM 61-120 MIN; OPERATIVE RIGHT HIP WITH PELVIS Fluoroscopic time: 30 seconds. COMPARISON:  Outside plain film examination 11/16/2017. FINDINGS:  Four intraoperative C-arm views submitted for review after surgery. Total right hip replacement appears in satisfactory position without complication noted on frontal projection. IMPRESSION: Post total right hip replacement. Electronically Signed   By: Lacy Duverney M.D.   On: 01/04/2018 14:07   Dg Hip Operative Unilat W Or W/o Pelvis Right  Result Date: 01/04/2018 CLINICAL DATA:  77 year old male for right hip replacement. Initial encounter. EXAM: DG C-ARM 61-120 MIN; OPERATIVE RIGHT HIP WITH PELVIS Fluoroscopic time: 30 seconds. COMPARISON:  Outside plain film examination 11/16/2017. FINDINGS: Four intraoperative C-arm views submitted for review after surgery. Total right hip replacement appears in satisfactory position without complication noted on frontal projection. IMPRESSION: Post total right hip replacement. Electronically Signed   By: Lacy Duverney M.D.   On: 01/04/2018 14:07    Disposition:     Follow-up Information    Kathryne Hitch, MD Follow up in 2 week(s).   Specialty:  Orthopedic Surgery Contact information: [address] [place] [address] [phone]            Signed: Richardean Canal 01/07/2018, 12:24 PM

## 2018-01-07 NOTE — Progress Notes (Signed)
Physical Therapy Treatment Patient Details Name: Eric BellowsDavid G Gallagher MRN: [id number] DOB: 09/05/1940 Today's Date: 01/07/2018    History of Present Illness 77yo male who received elective R THR via direct anterior approach on 01/04/18. PMH A-fib, DM, HTN, MI, CVA, hx cardiac cath     PT Comments    Patient seen for mobility progression. Pt is able to ambulate 100 ft and ascend/descend stairs simulating home entrance with min guard/min A and use of RW. Pt's son present throughout session and assisted with stair training. HEP handout given and reviewed with pt and son. Continue to progress as tolerated.    Follow Up Recommendations  Follow surgeon's recommendation for DC plan and follow-up therapies     Equipment Recommendations  Rolling walker with 5" wheels;3in1 (PT)    Recommendations for Other Services       Precautions / Restrictions Precautions Precautions: Fall Precaution Comments: direct anterior THA  Restrictions Weight Bearing Restrictions: Yes RLE Weight Bearing: Weight bearing as tolerated    Mobility  Bed Mobility Overal bed mobility: Needs Assistance Bed Mobility: Supine to Sit     Supine to sit: Min assist;HOB elevated     General bed mobility comments: assist to bring R LE to EOB; use of rail   Transfers Overall transfer level: Needs assistance Equipment used: Rolling walker (2 wheeled) Transfers: Sit to/from [employer]Stand;Stand Pivot Transfers Sit to Stand: Min assist;Min guard Stand pivot transfers: Min guard       General transfer comment: carry over of safe technique demonstrated; min A from EOB and min guard for safey with stand pivot and stand from recliner   Ambulation/Gait Ambulation/Gait assistance: Min guard;Min assist;+2 safety/equipment(for chair follow) Gait Distance (Feet): 100 Feet Assistive device: Rolling walker (2 wheeled) Gait Pattern/deviations: Step-to pattern;Decreased stance time - right;Decreased step length - left;Decreased weight  shift to right;Decreased dorsiflexion - right Gait velocity: decreased    General Gait Details: pt demonstrates improved R hip flexion during swing phase and is clearing floor more easily this session; cues for breathing technique, posture, and proximity to [employer]    Stairs Stairs: Yes Stairs assistance: Min assist Stair Management: No rails;Step to pattern;Backwards;With walker Number of Stairs: (2 steps X 2 trials) General stair comments: cues for sequencing and technique; second trial son assisted to stabilize RW    Wheelchair Mobility    Modified Rankin (Stroke Patients Only)       Balance Overall balance assessment: Needs assistance Sitting-balance support: No upper extremity supported;Feet supported Sitting balance-Leahy Scale: Good     Standing balance support: Bilateral upper extremity supported Standing balance-Leahy Scale: Poor Standing balance comment: dependent on BUE for standing                            Cognition Arousal/Alertness: Awake/alert Behavior During Therapy: WFL for tasks assessed/performed Overall Cognitive Status: Within Functional Limits for tasks assessed                                        Exercises      General Comments General comments (skin integrity, edema, etc.): HEP handout given and reviewed with pt and son; son present throughout session      Pertinent Vitals/Pain Pain Assessment: Faces Faces Pain Scale: Hurts little more Pain Location: R hip  Pain Descriptors / Indicators: Aching;Sore Pain Intervention(s): Limited activity within patient's tolerance;Monitored during session;Premedicated  before session;Repositioned    Home Living                      Prior Function            PT Goals (current goals can now be found in the care plan section) Acute Rehab PT Goals Patient Stated Goal: to be able to walk to my mailbox and back Progress towards PT goals: Progressing toward goals     Frequency    7X/week      PT Plan Current plan remains appropriate    Co-evaluation PT/OT/SLP Co-Evaluation/Treatment: Yes Reason for Co-Treatment: For patient/therapist safety;To address functional/ADL transfers          AM-PAC PT "6 Clicks" Mobility   Outcome Measure  Help needed turning from your back to your side while in a flat bed without using bedrails?: A Little Help needed moving from lying on your back to sitting on the side of a flat bed without using bedrails?: A Lot Help needed moving to and from a bed to a chair (including a wheelchair)?: A Little Help needed standing up from a chair using your arms (e.g., wheelchair or bedside chair)?: A Little Help needed to walk in hospital room?: A Little Help needed climbing 3-5 steps with a railing? : A Little 6 Click Score: 17    End of Session Equipment Utilized During Treatment: Gait belt Activity Tolerance: Patient tolerated treatment well Patient left: in chair;with call bell/phone within reach;with family/visitor present Nurse Communication: Mobility status PT Visit Diagnosis: Unsteadiness on feet (R26.81);Difficulty in walking, not elsewhere classified (R26.2);Muscle weakness (generalized) (M62.81)     Time: 1610-9604 PT Time Calculation (min) (ACUTE ONLY): 32 min  Charges:  $Gait Training: 23-37 mins                     Erline Levine, PTA Acute Rehabilitation Services Pager: [phone] Office: [phone]     Carolynne Edouard 01/07/2018, 4:47 PM

## 2018-01-07 NOTE — Progress Notes (Signed)
Physical Therapy Treatment Patient Details Name: Eric BellowsDavid G Gallagher MRN: [id number] DOB: 01/05/1941 Today's Date: 01/07/2018    History of Present Illness 77yo male who received elective R THR via direct anterior approach on 01/04/18. PMH A-fib, DM, HTN, MI, CVA, hx cardiac cath     PT Comments    Patient seen for mobility progression. Pt is making progress toward PT goals and tolerated increased gait distance. Pt is able to ambulate 100 ft with seated rest break due to fatigue and c/o dizziness. BP taken and WNL. Pt has tendency to hold breath while ambulating and needs cues for breathing technique. Son present. Patient needs to practice stairs next session.       Follow Up Recommendations  Follow surgeon's recommendation for DC plan and follow-up therapies     Equipment Recommendations  Rolling walker with 5" wheels;3in1 (PT);Other (comment)    Recommendations for Other Services       Precautions / Restrictions Precautions Precautions: Fall Precaution Comments: direct anterior THA  Restrictions Weight Bearing Restrictions: Yes RLE Weight Bearing: Weight bearing as tolerated    Mobility  Bed Mobility               General bed mobility comments: Pt OOB in recliner when therapy team entered  Transfers Overall transfer level: Needs assistance Equipment used: Rolling walker (2 wheeled)   Sit to Stand: Min guard;Min assist;+2 safety/equipment         General transfer comment: cues for safe hand placement and positioning prior to stand; assist to power up initial stand and then for safety next trial  Ambulation/Gait Ambulation/Gait assistance: Min guard;Min assist;+2 safety/equipment(for chair follow) Gait Distance (Feet): 100 Feet Assistive device: Rolling walker (2 wheeled) Gait Pattern/deviations: Step-to pattern;Decreased stance time - right;Decreased step length - left;Decreased weight shift to right;Decreased dorsiflexion - right Gait velocity: decreased     General Gait Details: seated rest break required; cues for posture/forward gaze, seqeuncing, breathing technique (pt tends to hold breath), and increased bilat step lengths/height   Stairs             Wheelchair Mobility    Modified Rankin (Stroke Patients Only)       Balance Overall balance assessment: Needs assistance Sitting-balance support: No upper extremity supported;Feet supported Sitting balance-Leahy Scale: Good     Standing balance support: Bilateral upper extremity supported Standing balance-Leahy Scale: Poor Standing balance comment: dependent on BUE for standing                            Cognition Arousal/Alertness: Awake/alert Behavior During Therapy: WFL for tasks assessed/performed Overall Cognitive Status: Within Functional Limits for tasks assessed                                        Exercises      General Comments General comments (skin integrity, edema, etc.): BP WNL       Pertinent Vitals/Pain Pain Assessment: Faces Faces Pain Scale: Hurts little more Pain Location: R hip  Pain Descriptors / Indicators: Aching;Sore Pain Intervention(s): Monitored during session;Repositioned    Home Living                      Prior Function            PT Goals (current goals can now be found in the care plan section)  Acute Rehab PT Goals Patient Stated Goal: to be able to walk to my mailbox and back Progress towards PT goals: Progressing toward goals    Frequency    7X/week      PT Plan Current plan remains appropriate    Co-evaluation   Reason for Co-Treatment: For patient/therapist safety;To address functional/ADL transfers PT goals addressed during session: Mobility/safety with mobility;Balance;Proper use of DME OT goals addressed during session: ADL's and self-care;Proper use of Adaptive equipment and DME      AM-PAC PT "6 Clicks" Mobility   Outcome Measure  Help needed turning from  your back to your side while in a flat bed without using bedrails?: A Little Help needed moving from lying on your back to sitting on the side of a flat bed without using bedrails?: A Lot Help needed moving to and from a bed to a chair (including a wheelchair)?: A Little Help needed standing up from a chair using your arms (e.g., wheelchair or bedside chair)?: A Little Help needed to walk in hospital room?: A Little Help needed climbing 3-5 steps with a railing? : A Lot 6 Click Score: 16    End of Session Equipment Utilized During Treatment: Gait belt Activity Tolerance: Patient tolerated treatment well Patient left: in chair;with call bell/phone within reach;with family/visitor present Nurse Communication: Mobility status PT Visit Diagnosis: Unsteadiness on feet (R26.81);Difficulty in walking, not elsewhere classified (R26.2);Muscle weakness (generalized) (M62.81)     Time: 1610-9604 PT Time Calculation (min) (ACUTE ONLY): 32 min  Charges:  $Gait Training: 8-22 mins                     Erline Levine, PTA Acute Rehabilitation Services Pager: [phone] Office: [phone]     Eric Gallagher 01/07/2018, 1:14 PM

## 2018-01-07 NOTE — Discharge Instructions (Signed)
INSTRUCTIONS AFTER JOINT REPLACEMENT  ° °o Remove items at home which could result in a fall. This includes throw rugs or furniture in walking pathways °o ICE to the affected joint every three hours while awake for 30 minutes at a time, for at least the first 3-5 days, and then as needed for pain and swelling.  Continue to use ice for pain and swelling. You may notice swelling that will progress down to the foot and ankle.  This is normal after surgery.  Elevate your leg when you are not up walking on it.   °o Continue to use the breathing machine you got in the hospital (incentive spirometer) which will help keep your temperature down.  It is common for your temperature to cycle up and down following surgery, especially at night when you are not up moving around and exerting yourself.  The breathing machine keeps your lungs expanded and your temperature down. ° ° °DIET:  As you were doing prior to hospitalization, we recommend a well-balanced diet. ° °DRESSING / WOUND CARE / SHOWERING ° °Keep the surgical dressing until follow up.  The dressing is water proof, so you can shower without any extra covering.  IF THE DRESSING FALLS OFF or the wound gets wet inside, change the dressing with sterile gauze.  Please use good hand washing techniques before changing the dressing.  Do not use any lotions or creams on the incision until instructed by your surgeon.   ° °ACTIVITY ° °o Increase activity slowly as tolerated, but follow the weight bearing instructions below.   °o No driving for 6 weeks or until further direction given by your physician.  You cannot drive while taking narcotics.  °o No lifting or carrying greater than 10 lbs. until further directed by your surgeon. °o Avoid periods of inactivity such as sitting longer than an hour when not asleep. This helps prevent blood clots.  °o You may return to work once you are authorized by your doctor.  ° ° ° °WEIGHT BEARING  ° °Weight bearing as tolerated with assist  device (walker, cane, etc) as directed, use it as long as suggested by your surgeon or therapist, typically at least 4-6 weeks. ° ° °EXERCISES ° °Results after joint replacement surgery are often greatly improved when you follow the exercise, range of motion and muscle strengthening exercises prescribed by your doctor. Safety measures are also important to protect the joint from further injury. Any time any of these exercises cause you to have increased pain or swelling, decrease what you are doing until you are comfortable again and then slowly increase them. If you have problems or questions, call your caregiver or physical therapist for advice.  ° °Rehabilitation is important following a joint replacement. After just a few days of immobilization, the muscles of the leg can become weakened and shrink (atrophy).  These exercises are designed to build up the tone and strength of the thigh and leg muscles and to improve motion. Often times heat used for twenty to thirty minutes before working out will loosen up your tissues and help with improving the range of motion but do not use heat for the first two weeks following surgery (sometimes heat can increase post-operative swelling).  ° °These exercises can be done on a training (exercise) mat, on the floor, on a table or on a bed. Use whatever works the best and is most comfortable for you.    Use music or television while you are exercising so that   the exercises are a pleasant break in your day. This will make your life better with the exercises acting as a break in your routine that you can look forward to.   Perform all exercises about fifteen times, three times per day or as directed.  You should exercise both the operative leg and the other leg as well. ° °Exercises include: °  °• Quad Sets - Tighten up the muscle on the front of the thigh (Quad) and hold for 5-10 seconds.   °• Straight Leg Raises - With your knee straight (if you were given a brace, keep it on),  lift the leg to 60 degrees, hold for 3 seconds, and slowly lower the leg.  Perform this exercise against resistance later as your leg gets stronger.  °• Leg Slides: Lying on your back, slowly slide your foot toward your buttocks, bending your knee up off the floor (only go as far as is comfortable). Then slowly slide your foot back down until your leg is flat on the floor again.  °• Angel Wings: Lying on your back spread your legs to the side as far apart as you can without causing discomfort.  °• Hamstring Strength:  Lying on your back, push your heel against the floor with your leg straight by tightening up the muscles of your buttocks.  Repeat, but this time bend your knee to a comfortable angle, and push your heel against the floor.  You may put a pillow under the heel to make it more comfortable if necessary.  ° °A rehabilitation program following joint replacement surgery can speed recovery and prevent re-injury in the future due to weakened muscles. Contact your doctor or a physical therapist for more information on knee rehabilitation.  ° ° °CONSTIPATION ° °Constipation is defined medically as fewer than three stools per week and severe constipation as less than one stool per week.  Even if you have a regular bowel pattern at home, your normal regimen is likely to be disrupted due to multiple reasons following surgery.  Combination of anesthesia, postoperative narcotics, change in appetite and fluid intake all can affect your bowels.  ° °YOU MUST use at least one of the following options; they are listed in order of increasing strength to get the job done.  They are all available over the counter, and you may need to use some, POSSIBLY even all of these options:   ° °Drink plenty of fluids (prune juice may be helpful) and high fiber foods °Colace 100 mg by mouth twice a day  °Senokot for constipation as directed and as needed Dulcolax (bisacodyl), take with full glass of water  °Miralax (polyethylene glycol)  once or twice a day as needed. ° °If you have tried all these things and are unable to have a bowel movement in the first 3-4 days after surgery call either your surgeon or your primary doctor.   ° °If you experience loose stools or diarrhea, hold the medications until you stool forms back up.  If your symptoms do not get better within 1 week or if they get worse, check with your doctor.  If you experience "the worst abdominal pain ever" or develop nausea or vomiting, please contact the office immediately for further recommendations for treatment. ° ° °ITCHING:  If you experience itching with your medications, try taking only a single pain pill, or even half a pain pill at a time.  You can also use Benadryl over the counter for itching or also to   help with sleep.   TED HOSE STOCKINGS:  Use stockings on both legs until for at least 2 weeks or as directed by physician office. They may be removed at night for sleeping.  MEDICATIONS:  See your medication summary on the After Visit Summary that nursing will review with you.  You may have some home medications which will be placed on hold until you complete the course of blood thinner medication.  It is important for you to complete the blood thinner medication as prescribed.  PRECAUTIONS:  If you experience chest pain or shortness of breath - call 911 immediately for transfer to the hospital emergency department.   If you develop a fever greater that 101 F, purulent drainage from wound, increased redness or drainage from wound, foul odor from the wound/dressing, or calf pain - CONTACT YOUR SURGEON.                                                   FOLLOW-UP APPOINTMENTS:  If you do not already have a post-op appointment, please call the office for an appointment to be seen by your surgeon.  Guidelines for how soon to be seen are listed in your After Visit Summary, but are typically between 1-4 weeks after surgery.  OTHER INSTRUCTIONS:   Knee  Replacement:  Do not place pillow under knee, focus on keeping the knee straight while resting. CPM instructions: 0-90 degrees, 2 hours in the morning, 2 hours in the afternoon, and 2 hours in the evening. Place foam block, curve side up under heel at all times except when in CPM or when walking.  DO NOT modify, tear, cut, or change the foam block in any way.  MAKE SURE YOU:   Understand these instructions.   Get help right away if you are not doing well or get worse.    Thank you for letting us be a part of your medical care team.  It is a privilege we respect greatly.  We hope these instructions will help you stay on track for a fast and full recovery!     Information on my medicine - Coumadin   (Warfarin)  This medication education was reviewed with me or my healthcare representative as part of my discharge preparation.   Why was Coumadin prescribed for you? Coumadin was prescribed for you because you have a blood clot or a medical condition that can cause an increased risk of forming blood clots. Blood clots can cause serious health problems by blocking the flow of blood to the heart, lung, or brain. Coumadin can prevent harmful blood clots from forming. As a reminder your indication for Coumadin is:   Stroke Prevention Because Of Atrial Fibrillation  What test will check on my response to Coumadin? While on Coumadin (warfarin) you will need to have an INR test regularly to ensure that your dose is keeping you in the desired range. The INR (international normalized ratio) number is calculated from the result of the laboratory test called prothrombin time (PT).  If an INR APPOINTMENT HAS NOT ALREADY BEEN MADE FOR YOU please schedule an appointment to have this lab work done by your health care provider within 7 days. Your INR goal is usually a number between:  2 to 3 or your provider may give you a more narrow range like 2-2.5.  Ask your  health care provider during an office visit what  your goal INR is.  What  do you need to  know  About  COUMADIN? Take Coumadin (warfarin) exactly as prescribed by your healthcare provider about the same time each day.  DO NOT stop taking without talking to the doctor who prescribed the medication.  Stopping without other blood clot prevention medication to take the place of Coumadin may increase your risk of developing a new clot or stroke.  Get refills before you run out.  What do you do if you miss a dose? If you miss a dose, take it as soon as you remember on the same day then continue your regularly scheduled regimen the next day.  Do not take two doses of Coumadin at the same time.  Important Safety Information A possible side effect of Coumadin (Warfarin) is an increased risk of bleeding. You should call your healthcare provider right away if you experience any of the following: ? Bleeding from an injury or your nose that does not stop. ? Unusual colored urine (red or dark brown) or unusual colored stools (red or black). ? Unusual bruising for unknown reasons. ? A serious fall or if you hit your head (even if there is no bleeding).  Some foods or medicines interact with Coumadin (warfarin) and might alter your response to warfarin. To help avoid this: ? Eat a balanced diet, maintaining a consistent amount of Vitamin K. ? Notify your provider about major diet changes you plan to make. ? Avoid alcohol or limit your intake to 1 drink for women and 2 drinks for men per day. (1 drink is 5 oz. wine, 12 oz. beer, or 1.5 oz. liquor.)  Make sure that ANY health care provider who prescribes medication for you knows that you are taking Coumadin (warfarin).  Also make sure the healthcare provider who is monitoring your Coumadin knows when you have started a new medication including herbals and non-prescription products.  Coumadin (Warfarin)  Major Drug Interactions  Increased Warfarin Effect Decreased Warfarin Effect  Alcohol (large  quantities) Antibiotics (esp. Septra/Bactrim, Flagyl, Cipro) Amiodarone (Cordarone) Aspirin (ASA) Cimetidine (Tagamet) Megestrol (Megace) NSAIDs (ibuprofen, naproxen, etc.) Piroxicam (Feldene) Propafenone (Rythmol SR) Propranolol (Inderal) Isoniazid (INH) Posaconazole (Noxafil) Barbiturates (Phenobarbital) Carbamazepine (Tegretol) Chlordiazepoxide (Librium) Cholestyramine (Questran) Griseofulvin Oral Contraceptives Rifampin Sucralfate (Carafate) Vitamin K   Coumadin (Warfarin) Major Herbal Interactions  Increased Warfarin Effect Decreased Warfarin Effect  Garlic Ginseng Ginkgo biloba Coenzyme Q10 Green tea St. Johns wort    Coumadin (Warfarin) FOOD Interactions  Eat a consistent number of servings per week of foods HIGH in Vitamin K (1 serving =  cup)  Collards (cooked, or boiled & drained) Kale (cooked, or boiled & drained) Mustard greens (cooked, or boiled & drained) Parsley *serving size only =  cup Spinach (cooked, or boiled & drained) Swiss chard (cooked, or boiled & drained) Turnip greens (cooked, or boiled & drained)  Eat a consistent number of servings per week of foods MEDIUM-HIGH in Vitamin K (1 serving = 1 cup)  Asparagus (cooked, or boiled & drained) Broccoli (cooked, boiled & drained, or raw & chopped) Brussel sprouts (cooked, or boiled & drained) *serving size only =  cup Lettuce, raw (green leaf, endive, romaine) Spinach, raw Turnip greens, raw & chopped   These websites have more information on Coumadin (warfarin):  http://www.king-russell.com/; https://www.hines.net/;

## 2018-01-07 NOTE — Progress Notes (Signed)
Occupational Therapy Treatment Patient Details Name: Eric BellowsDavid G Gallagher MRN: [id number] DOB: 07/15/1940 Today's Date: 01/07/2018    History of present illness 77yo male who received elective R THR via direct anterior approach on 01/04/18. PMH A-fib, DM, HTN, MI, CVA, hx cardiac cath    OT comments  Pt with significant improvement this session. Pt BP remained stable throughout session - allowing him to move more and perform multiple sit<>stand transfers with increasing independence (by end performing at min guard assist level for balance and safety) Pt will require a 3 in 1 for toilet transfers and as shower seat due to the arm rests. He does have a walk in shower - and educated Pt and son on importance of supervision in the shower for safety (especially with blood pressure issues). OT will continue to follow acutely and HHOT/24 hour supervision is essential for safety and independence.    Follow Up Recommendations  Home health OT;Supervision/Assistance - 24 hour(as long as activity tolerance improves and family can manage)    Equipment Recommendations  3 in 1 bedside commode    Recommendations for Other Services      Precautions / Restrictions Precautions Precautions: Fall Precaution Comments: direct anterior THA  Restrictions Weight Bearing Restrictions: Yes RLE Weight Bearing: Weight bearing as tolerated       Mobility Bed Mobility               General bed mobility comments: Pt OOB in recliner when therapy team entered  Transfers Overall transfer level: Needs assistance Equipment used: Rolling walker (2 wheeled)   Sit to Stand: Min guard;Min assist;+2 safety/equipment         General transfer comment: cues for UE placement. Pt has to have RLE under him min guard assist for safety and balance    Balance Overall balance assessment: Needs assistance Sitting-balance support: No upper extremity supported;Feet supported Sitting balance-Leahy Scale: Good     Standing  balance support: Bilateral upper extremity supported Standing balance-Leahy Scale: Poor Standing balance comment: dependent on BUE for standing                           ADL either performed or assessed with clinical judgement   ADL Overall ADL's : Needs assistance/impaired     Grooming: Set up;Sitting;Oral care Grooming Details (indicate cue type and reason): in recliner                 Toilet Transfer: Min guard;Minimal assistance;Ambulation;RW(3 in 1 over toilet) Toilet Transfer Details (indicate cue type and reason): simulated through multiple transfers with recliner - pt dependent on arm rests for transfers Toileting- ArchitectClothing Manipulation and Hygiene: Min guard;Minimal assistance;Sit to/from stand Toileting - Clothing Manipulation Details (indicate cue type and reason): foley cath in place still     Functional mobility during ADLs: Min guard;Minimal assistance;Rolling walker General ADL Comments: Pt with BP WFL limits today. Pt does have a tendency to hold breath during ambulation - so cues required     Vision       Perception     Praxis      Cognition Arousal/Alertness: Awake/alert Behavior During Therapy: WFL for tasks assessed/performed Overall Cognitive Status: Within Functional Limits for tasks assessed                                          Exercises  Shoulder Instructions       General Comments son present throughout session, emptied 1000cc of urine from bag    Pertinent Vitals/ Pain       Pain Assessment: Faces Faces Pain Scale: Hurts little more Pain Location: R hip  Pain Descriptors / Indicators: Aching;Sore Pain Intervention(s): Monitored during session;Repositioned  Home Living                                          Prior Functioning/Environment              Frequency  Min 3X/week        Progress Toward Goals  OT Goals(current goals can now be found in the care plan  section)  Progress towards OT goals: Progressing toward goals  Acute Rehab OT Goals Patient Stated Goal: to be able to walk to my mailbox and back OT Goal Formulation: With patient Time For Goal Achievement: 01/19/18 Potential to Achieve Goals: Good  Plan Discharge plan remains appropriate;Frequency remains appropriate    Co-evaluation    PT/OT/SLP Co-Evaluation/Treatment: Yes Reason for Co-Treatment: For patient/therapist safety;To address functional/ADL transfers PT goals addressed during session: Mobility/safety with mobility;Balance;Proper use of DME OT goals addressed during session: ADL's and self-care;Proper use of Adaptive equipment and DME      AM-PAC OT "6 Clicks" Daily Activity     Outcome Measure   Help from another person eating meals?: None Help from another person taking care of personal grooming?: A Little Help from another person toileting, which includes using toliet, bedpan, or urinal?: A Little Help from another person bathing (including washing, rinsing, drying)?: A Little Help from another person to put on and taking off regular upper body clothing?: A Little Help from another person to put on and taking off regular lower body clothing?: A Little 6 Click Score: 19    End of Session Equipment Utilized During Treatment: Gait belt;Rolling walker  OT Visit Diagnosis: Unsteadiness on feet (R26.81);Other abnormalities of gait and mobility (R26.89);Muscle weakness (generalized) (M62.81);Other symptoms and signs involving cognitive function;Pain Pain - Right/Left: Right Pain - part of body: Hip   Activity Tolerance Patient tolerated treatment well   Patient Left in chair;with call bell/phone within reach;with family/visitor present   Nurse Communication Mobility status(1000 cc of urine emptied)        Time: 0930-1002 OT Time Calculation (min): 32 min  Charges: OT General Charges $OT Visit: 1 Visit OT Treatments $Self Care/Home Management : 8-22  mins  Sherryl Manges OTR/L Acute Rehabilitation Services Pager: [phone] Office: [phone]  Eric Gallagher 01/07/2018, 10:19 AM

## 2018-01-07 NOTE — Care Management Important Message (Signed)
Important Message  Patient Details  Name: Otho BellowsDavid G Dibartolo MRN: [id number] Date of Birth: 07/17/1940   Medicare Important Message Given:  Yes    Yuleidy Rappleye 01/07/2018, 11:41 AM

## 2018-01-10 ENCOUNTER — Telehealth (INDEPENDENT_AMBULATORY_CARE_PROVIDER_SITE_OTHER): Payer: Self-pay | Admitting: Orthopaedic Surgery

## 2018-01-10 NOTE — Telephone Encounter (Signed)
Verbal order given  

## 2018-01-10 NOTE — Telephone Encounter (Signed)
Eric Gallagher -(PT) from [facility] called needing [facility] 1 Wk 1 and 3 Wk 2. The number to contact Byrd HesselbachMaria is  [phone]

## 2018-01-17 ENCOUNTER — Encounter (INDEPENDENT_AMBULATORY_CARE_PROVIDER_SITE_OTHER): Payer: Self-pay | Admitting: Orthopaedic Surgery

## 2018-01-17 ENCOUNTER — Ambulatory Visit (INDEPENDENT_AMBULATORY_CARE_PROVIDER_SITE_OTHER): Payer: Medicare Other | Admitting: Orthopaedic Surgery

## 2018-01-17 DIAGNOSIS — Z96641 Presence of right artificial hip joint: Secondary | ICD-10-CM

## 2018-01-17 MED ORDER — METHOCARBAMOL 500 MG PO TABS: 500.0000 mg | ORAL_TABLET | Freq: Four times a day (QID) | ORAL | 1 refills | Status: DC | PRN

## 2018-01-17 MED ORDER — OXYCODONE HCL 5 MG PO TABS: 5.0000 mg | ORAL_TABLET | ORAL | 0 refills | Status: DC | PRN

## 2018-01-17 NOTE — Progress Notes (Signed)
The patient is 2 weeks tomorrow status post a right total hip arthroplasty.  He is 77 years old and has had a slow time getting around.  He does feel like he is making some progress.  He is on chronic Coumadin and is back on this.  On examination his right hip incision looks good.  I removed the old Steri-Strips in place new wounds.  His leg lengths appear equal.  His calf is soft.  At this point I will refill his pain medication which includes Robaxin and oxycodone.  He will continue increase his activities.  He has 1 more week of home therapy.  We will see him back in 4 weeks to see how he is doing overall.  All question concerns were answered and addressed.

## 2018-01-18 ENCOUNTER — Ambulatory Visit (INDEPENDENT_AMBULATORY_CARE_PROVIDER_SITE_OTHER): Payer: Medicare Other | Admitting: Orthopaedic Surgery

## 2018-02-14 ENCOUNTER — Encounter (INDEPENDENT_AMBULATORY_CARE_PROVIDER_SITE_OTHER): Payer: Self-pay | Admitting: Orthopaedic Surgery

## 2018-02-14 ENCOUNTER — Ambulatory Visit (INDEPENDENT_AMBULATORY_CARE_PROVIDER_SITE_OTHER): Payer: Medicare Other | Admitting: Orthopaedic Surgery

## 2018-02-14 DIAGNOSIS — M1612 Unilateral primary osteoarthritis, left hip: Secondary | ICD-10-CM

## 2018-02-14 DIAGNOSIS — Z96641 Presence of right artificial hip joint: Secondary | ICD-10-CM

## 2018-02-14 NOTE — Progress Notes (Signed)
The patient is very well-known to [place].  He is a very pleasant 78 year old active gentleman who is now 6-week status post a right total hip arthroplasty to treat severe osteoarthritis.  He is very pleased with the right hip and is almost essentially pain-free on the right hip.  His left hip has known severe debilitating arthritis.  It is causing him to have significant pain now on the left side.  It was well documented before surgery that he had severe arthritis of his left hip and severe pain with that hip.  At this point given the good recovery on his right side he would like to go and have his left hip replaced.  A review of x-rays with him does show significant osteoarthritis of the left hip.  There are particular osteophytes and cystic changes with joint space narrowing as well.  On examination his right operative hip moves smoothly with full internal/external rotation.  Left hip has limitations with internal X rotation with severe pain and stiffness.  This point I do feel is reasonable to proceed with a left total hip arthroplasty given the fact that his pain is 10 out of 10 on the left side and it is detrimentally affecting his mobility, his quality life and his activities daily living.  He is also failed conservative treatment on the left side for over a year.  Since he is done so well with his right hip replacement is certainly reasonable to proceed with a left total hip arthroplasty.  All question concerns were answered and addressed.  We will work on getting this scheduled and we will see him back in 2 weeks postoperative.

## 2018-02-17 ENCOUNTER — Other Ambulatory Visit (INDEPENDENT_AMBULATORY_CARE_PROVIDER_SITE_OTHER): Payer: Self-pay

## 2018-02-17 ENCOUNTER — Other Ambulatory Visit (INDEPENDENT_AMBULATORY_CARE_PROVIDER_SITE_OTHER): Payer: Self-pay | Admitting: Physician Assistant

## 2018-02-22 NOTE — Pre-Procedure Instructions (Signed)
Otho BellowsDavid G Nephew  02/22/2018      [id number] - [place], [place] - [address] [place] [id number] Phone: [phone] Fax: [phone]    Your procedure is scheduled on Tuesday, January 21st.  [facility]30 [facility].M.  Call this number if you have problems the morning of surgery:  [phone]   Remember:  Do not eat or drink after midnight.    Take these medicines the morning of surgery with A SIP OF WATER   metoprolol (LOPRESSOR) methocarbamol (ROBAXIN)-as needed nitroGLYCERIN (NITROSTAT)-please let NURSE know if you had to use this.    Follow your surgeon's instructions on when to stop/resume warfarin (COUMADIN) .  If no instructions were given by your surgeon then you will need to call the office to get those instructions.     WHAT DO I DO ABOUT MY DIABETES MEDICATION?   Marland Kitchen. Do not take oral diabetes medicines (pills) the morning of surgery. DO NOT take your metFORMIN (GLUCOPHAGE).   . THE NIGHT BEFORE SURGERY, Do NOT take your glipiZIDE (GLUCOTROL).       How to Manage Your Diabetes Before and After Surgery  Why is it important to control my blood sugar before and after surgery? . Improving blood sugar levels before and after surgery helps healing and can limit problems. . A way of improving blood sugar control is eating a healthy diet by: o  Eating less sugar and carbohydrates o  Increasing activity/exercise o  Talking with your doctor about reaching your blood sugar goals . High blood sugars (greater than 180 mg/dL) can raise your risk of infections and slow your recovery, so you will need to focus on controlling your diabetes during the weeks before surgery. . Make sure that the doctor who takes care of your diabetes knows about your planned surgery including the date and location.  How do I manage my blood sugar before surgery? . Check your blood sugar at least 4 times a day, starting 2 days before  surgery, to make sure that the level is not too high or low. o Check your blood sugar the morning of your surgery when you wake up and every 2 hours until you get to the Short Stay unit. . If your blood sugar is less than 70 mg/dL, you will need to treat for low blood sugar: o Do not take insulin. o Treat a low blood sugar (less than 70 mg/dL) with  cup of clear juice (cranberry or apple), 4 glucose tablets, OR glucose gel. o Recheck blood sugar in 15 minutes after treatment (to make sure it is greater than 70 mg/dL). If your blood sugar is not greater than 70 mg/dL on recheck, call [phone] for further instructions. . Report your blood sugar to the short stay nurse when you get to Short Stay.  . If you are admitted to the hospital after surgery: o Your blood sugar will be checked by the staff and you will probably be given insulin after surgery (instead of oral diabetes medicines) to make sure you have good blood sugar levels. o The goal for blood sugar control after surgery is 80-180 mg/dL     Do not wear jewelry, make-up or nail polish.  Do not wear lotions, powders, or perfumes, or deodorant.  Do not shave 48 hours prior to surgery.  Men may shave face and neck.  Do not bring valuables to the hospital.  [facility] is  not responsible for any belongings or valuables.  Contacts, dentures or bridgework may not be worn into surgery.  Leave your suitcase in the car.  After surgery it may be brought to your room.  For patients admitted to the hospital, discharge time will be determined by your treatment team.  Patients discharged the day of surgery will not be allowed to drive home.   Special instructions:   [facility]- Preparing For Surgery  Before surgery, you can play an important role. Because skin is not sterile, your skin needs to be as free of germs as possible. You can reduce the number of germs on your skin by washing with CHG (chlorahexidine gluconate) Soap before surgery.   CHG is an antiseptic cleaner which kills germs and bonds with the skin to continue killing germs even after washing.    Oral Hygiene is also important to reduce your risk of infection.  Remember - BRUSH YOUR TEETH THE MORNING OF SURGERY WITH YOUR REGULAR TOOTHPASTE  Please do not use if you have an allergy to CHG or antibacterial soaps. If your skin becomes reddened/irritated stop using the CHG.  Do not shave (including legs and underarms) for at least 48 hours prior to first CHG shower. It is OK to shave your face.  Please follow these instructions carefully.   1. Shower the NIGHT BEFORE SURGERY and the MORNING OF SURGERY with CHG.   2. If you chose to wash your hair, wash your hair first as usual with your normal shampoo.  3. After you shampoo, rinse your hair and body thoroughly to remove the shampoo.  4. Use CHG as you would any other liquid soap. You can apply CHG directly to the skin and wash gently with a scrungie or a clean washcloth.   5. Apply the CHG Soap to your body ONLY FROM THE NECK DOWN.  Do not use on open wounds or open sores. Avoid contact with your eyes, ears, mouth and genitals (private parts). Wash Face and genitals (private parts)  with your normal soap.  6. Wash thoroughly, paying special attention to the area where your surgery will be performed.  7. Thoroughly rinse your body with warm water from the neck down.  8. DO NOT shower/wash with your normal soap after using and rinsing off the CHG Soap.  9. Pat yourself dry with a CLEAN TOWEL.  10. Wear CLEAN PAJAMAS to bed the night before surgery, wear comfortable clothes the morning of surgery  11. Place CLEAN SHEETS on your bed the night of your first shower and DO NOT SLEEP WITH PETS.    Day of Surgery:  Do not apply any deodorants/lotions.  Please wear clean clothes to the hospital/surgery center.   Remember to brush your teeth WITH YOUR REGULAR TOOTHPASTE.   Please read over the following fact sheets  that you were given.

## 2018-02-23 ENCOUNTER — Encounter (HOSPITAL_COMMUNITY)
Admission: RE | Admit: 2018-02-23 | Discharge: 2018-02-23 | Disposition: A | Discharge status: Home / Self Care | Payer: Medicare Other | Source: Ambulatory Visit | Attending: Orthopaedic Surgery | Admitting: Orthopaedic Surgery

## 2018-02-23 ENCOUNTER — Encounter (HOSPITAL_COMMUNITY): Payer: Self-pay

## 2018-02-23 DIAGNOSIS — Z01812 Encounter for preprocedural laboratory examination: Secondary | ICD-10-CM | POA: Diagnosis not present

## 2018-02-23 LAB — CBC
HCT: 45.7 % (ref 39.0–52.0)
HEMOGLOBIN: 14.3 g/dL (ref 13.0–17.0)
MCH: 29.7 pg (ref 26.0–34.0)
MCHC: 31.3 g/dL (ref 30.0–36.0)
MCV: 95 fL (ref 80.0–100.0)
Platelets: 175 10*3/uL (ref 150–400)
RBC: 4.81 MIL/uL (ref 4.22–5.81)
RDW: 13.8 % (ref 11.5–15.5)
WBC: 6 10*3/uL (ref 4.0–10.5)
nRBC: 0 % (ref 0.0–0.2)

## 2018-02-23 LAB — BASIC METABOLIC PANEL WITH GFR
Anion gap: 10 (ref 5–15)
BUN: 11 mg/dL (ref 8–23)
CO2: 26 mmol/L (ref 22–32)
Calcium: 9.3 mg/dL (ref 8.9–10.3)
Chloride: 103 mmol/L (ref 98–111)
Creatinine, Ser: 0.86 mg/dL (ref 0.61–1.24)
GFR calc Af Amer: >60 mL/min
GFR calc non Af Amer: >60 mL/min
Glucose, Bld: 119 mg/dL — ABNORMAL HIGH (ref 70–99)
Potassium: 4.3 mmol/L (ref 3.5–5.1)
Sodium: 139 mmol/L (ref 135–145)

## 2018-02-23 LAB — PROTIME-INR
INR: 2.2
Prothrombin Time: 24.1 seconds — ABNORMAL HIGH (ref 11.4–15.2)

## 2018-02-23 LAB — GLUCOSE, CAPILLARY: Glucose-Capillary: 101 mg/dL — ABNORMAL HIGH (ref 70–99)

## 2018-02-23 LAB — SURGICAL PCR SCREEN
MRSA, PCR: NEGATIVE
Staphylococcus aureus: NEGATIVE

## 2018-02-23 LAB — HEMOGLOBIN A1C
Hgb A1c MFr Bld: 6.3 % — ABNORMAL HIGH (ref 4.8–5.6)
Mean Plasma Glucose: 134.11 mg/dL

## 2018-02-23 NOTE — Progress Notes (Signed)
PCP - Dr Donette Larry Cardiologist - Dr Mendel Ryder  Chest x-ray - 03/03/15 EKG - 11/23/17 Stress Test - 07/07/16 ECHO - 07/10/16 Cardiac Cath - 07/14/14  Sleep Study -denis CPAP - denies  Fasting Blood Sugar - 101 Checks Blood Sugar __prn___ times a day  Blood Thinner Instructions:stop coumadin 02/24/18 and start bridge x6 days per Dr Katrinka Blazing. Clearance done Aspirin Instructions:stopped  Anesthesia review: cardiac hx. Hx AF  Patient denies shortness of breath, fever, cough and chest pain at PAT appointment   Patient verbalized understanding of instructions that were given to them at the PAT appointment. Patient was also instructed that they will need to review over the PAT instructions again at home before surgery.

## 2018-02-23 NOTE — Progress Notes (Signed)
  Pt to stop coumadin 02/24/18 and start bridge of Lovenox. Per Dr Verdis Prime, cardiac clearance given. Hx AF

## 2018-02-24 NOTE — Anesthesia Preprocedure Evaluation (Addendum)
Anesthesia Evaluation  Patient identified by MRN, date of birth, ID band Patient awake    Reviewed: Allergy & Precautions, NPO status , Patient's Chart, lab work & pertinent test results  History of Anesthesia Complications Negative for: history of anesthetic complications  Airway Mallampati: II  TM Distance: >3 FB Neck ROM: Full    Dental  (+) Dental Advisory Given, Teeth Intact, Chipped,    Pulmonary former smoker,    breath sounds clear to auscultation       Cardiovascular hypertension, Pt. on medications + CAD, + Past MI and + Cardiac Stents  + dysrhythmias Atrial Fibrillation  Rhythm:Irregular Rate:Normal     Neuro/Psych CVA, No Residual Symptoms negative psych ROS   GI/Hepatic negative GI ROS, Neg liver ROS,   Endo/Other  diabetes, Type 2, Oral Hypoglycemic Agents  Renal/GU negative Renal ROS     Musculoskeletal  (+) Arthritis ,   Abdominal   Peds  Hematology negative hematology ROS (+)   Anesthesia Other Findings   Reproductive/Obstetrics                           Anesthesia Physical Anesthesia Plan  ASA: III  Anesthesia Plan: Spinal   Post-op Pain Management:    Induction:   PONV Risk Score and Plan: 1 and Treatment may vary due to age or medical condition and Propofol infusion  Airway Management Planned: Natural Airway and Simple Face Mask  Additional Equipment: None  Intra-op Plan:   Post-operative Plan:   Informed Consent: I have reviewed the patients History and Physical, chart, labs and discussed the procedure including the risks, benefits and alternatives for the proposed anesthesia with the patient or authorized representative who has indicated his/her understanding and acceptance.       Plan Discussed with: CRNA and Anesthesiologist  Anesthesia Plan Comments: (Labs reviewed, platelets acceptable. Discussed risks and benefits of spinal, including  spinal/epidural hematoma, infection, failed block, and PDPH. Patient expressed understanding and wished to proceed. )      Anesthesia Quick Evaluation

## 2018-02-24 NOTE — Progress Notes (Signed)
Anesthesia Chart Review:  Case:  [id number] Date/Time:  03/01/18 1424   Procedure:  LEFT TOTAL HIP ARTHROPLASTY ANTERIOR APPROACH (Left )   Anesthesia type:  Choice   Pre-op diagnosis:  osteoarthritis left hip   Location:  [facility] ROOM 06 / [facility] OR   Surgeon:  Kathryne Hitch, MD      DISCUSSION: Patient is a 78 year old male scheduled for the above procedure. He is s/p right THA 01/04/18.   History includes former smoker (quit '67), CAD (NSTEMI, s/p DES LAD 03/27/03), afib, CVA 2017, HLD, chronic thrombocytopenia, DM2 with peripheral neuropathy,  Cardiologist Dr. Katrinka Blazing saw in 11/2017 and cleared him for his right THA at that time with recommendation for Lovenox bridge. Prior cardiac testing in 2018. Last warfarin 02/24/18. Lovenox bridge per [facility].  PLT count WNL 02/23/18 (previously 95-147 K around 07/2014 and 12/2017 in Epic). INR > 2, but was still on warfarin at that time. He will need a repeat PT/INR on the day of surgery, but if results acceptable and otherwise no acute change then I would anticipate he could proceed as planned.   VS: BP (!) 165/87   Pulse 78   Temp (!) 36.4 C   Resp 20   Ht 6\' 3"  (1.905 m)   Wt 102.6 kg   SpO2 100%   BMI 28.27 kg/m   PROVIDERS: Georgann Housekeeper, MD is PCP Verdis Prime, MD is cardiologist. Last visit 11/23/17 for follow-up and preoperative evaluation.    LABS: Labs reviewed: Repeat PT/INR on the day of surgery.  (all labs ordered are listed, but only abnormal results are displayed)  Labs Reviewed  GLUCOSE, CAPILLARY - Abnormal; Notable for the following components:      Result Value   Glucose-Capillary 101 (*)    All other components within normal limits  BASIC METABOLIC PANEL - Abnormal; Notable for the following components:   Glucose, Bld 119 (*)    All other components within normal limits  PROTIME-INR - Abnormal; Notable for the following components:   Prothrombin Time 24.1 (*)    All other components within normal limits   HEMOGLOBIN A1C - Abnormal; Notable for the following components:   Hgb A1c MFr Bld 6.3 (*)    All other components within normal limits  SURGICAL PCR SCREEN  CBC    EKG: 11/23/17: Afib at 55 bpm. Non-specific ST abnormality.  CV: Echo 07/10/16: Study Conclusions - Left ventricle: The cavity size was mildly dilated. Systolic   function was normal. The estimated ejection fraction was in the   range of 55% to 60%. Wall motion was normal; there were no   regional wall motion abnormalities. - Left atrium: The atrium was severely dilated. - Right ventricle: The cavity size was mildly dilated. Wall   thickness was normal. - Right atrium: The atrium was severely dilated. - Atrial septum: No defect or patent foramen ovale was identified. - Pulmonary arteries: PA peak pressure: 32 mm Hg (S).  Nuclear stress test 07/07/16:  The left ventricular ejection fraction is normal (55-65%).  Nuclear stress EF: 56%.  No T wave inversion was noted during stress.  There was no ST segment deviation noted during stress.  Defect 1: There is a small defect of mild severity.  This is a low risk study. Small size, mild intensity fixed apical/apical lateral perfusion defect, likely artifact. No reversible ischemia. LVEF 56% with normal wall motion. This is a low risk study.  Carotid US 03/03/15 [facility]): Impression: 1.  Cervical carotid atherosclerosis.  No evidence of ICA stenosis greater than 50%. 2.  High-grade right ECA stenosis. 3.  Right vertebral artery not competently visualized.  Cardiac cath 07/24/14: 1. 1st RPLB lesion, 90% stenosed. Type C lesion. 2. Prox RCA to Dist RCA lesion, 30% stenosed. 3. Ost RCA lesion, 40% stenosed. 4. Dist Cx lesion, 70% stenosed. 5. Prox Cx to Dist Cx lesion, 50% stenosed. 6. Prox LAD to Mid LAD lesion, 55% stenosed. 7. Ost LAD to Prox LAD lesion, 55% stenosed.   90% third left ventricular branch of the right coronary artery and 50-70% obtuse  marginal. Proximal LAD contains eccentric 55% narrowing  Normal left ventricular systolic function with normal hemodynamics  No evidence of pulmonary hypertension Recommendations:   Because of significant desaturation after sedation it is felt that a sleep study is indicated and may explain the patient's fatigue and dyspnea.   Past Medical History:  Diagnosis Date  . Anginal pain (HCC)    h/o   . Arrhythmia    Chronic AFIB, no anti-coag patient desire and low platelet count  . Arthritis    both hips   . BPH (benign prostatic hyperplasia)   . Chronic a-fib   . Coronary artery disease    NSTEMI in 2005, Taxus DES LAD  . Diabetes mellitus without complication (HCC)    type II, some neuropathy  . Dysrhythmia    afib  . ED (erectile dysfunction)   . History of kidney stones 2012   passed spontaneously  . Hyperlipidemia   . Hypertension   . Old MI (myocardial infarction)   . Stroke (HCC) 2017  . Thrombocytopenia (HCC)    chronic    Past Surgical History:  Procedure Laterality Date  . APPENDECTOMY     as a teen  . CARDIAC CATHETERIZATION N/A 07/24/2014   Procedure: Right/Left Heart Cath and Coronary Angiography;  Surgeon: Lyn RecordsHenry W Smith, MD;  [facility];  Service: Cardiovascular;  Laterality: N/A;  . EYE SURGERY Left 2016   retinal surgery, cataracts removed- both eyes  . HERNIA REPAIR  1998   L inguinal   . I&D EXTREMITY Right 06/18/2014   Procedure: OPEN TREATMENT RIGHT THUMB, INCOMPLETE AMPUTATION.  Washing and dressing applied to left thumb.;  Surgeon: Mack Hookavid Thompson, MD;  [facility];  Service: Orthopedics;  Laterality: Right;  . TOTAL HIP ARTHROPLASTY Right 01/04/2018   Procedure: RIGHT TOTAL HIP ARTHROPLASTY ANTERIOR APPROACH;  Surgeon: Kathryne HitchBlackman, Christopher Y, MD;  Location: [facility] OR;  Service: Orthopedics;  Laterality: Right;    MEDICATIONS: . ACCU-CHEK AVIVA PLUS test strip  . ACCU-CHEK SOFTCLIX LANCETS lancets  . alfuzosin (UROXATRAL) 10 MG  24 hr tablet  . glipiZIDE (GLUCOTROL) 5 MG tablet  . metFORMIN (GLUCOPHAGE) 1000 MG tablet  . methocarbamol (ROBAXIN) 500 MG tablet  . metoprolol (LOPRESSOR) 100 MG tablet  . nitroGLYCERIN (NITROSTAT) 0.4 MG SL tablet  . oxyCODONE (OXY IR/ROXICODONE) 5 MG immediate release tablet  . simvastatin (ZOCOR) 40 MG tablet  . warfarin (COUMADIN) 5 MG tablet   No current facility-administered medications for this encounter.     Shonna ChockAllison Unika Nazareno, PA-C Surgical Short Stay/Anesthesiology [facility] Phone [phone] [facility] Phone [phone] 02/24/2018 2:31 PM

## 2018-02-28 MED ORDER — TRANEXAMIC ACID-NACL 1000-0.7 MG/100ML-% IV SOLN
1000.0000 mg | INTRAVENOUS | Status: AC
  Administered 2018-03-01: 1000 mg via INTRAVENOUS

## 2018-03-01 ENCOUNTER — Ambulatory Visit (HOSPITAL_COMMUNITY): Payer: Medicare Other

## 2018-03-01 ENCOUNTER — Other Ambulatory Visit: Payer: Self-pay

## 2018-03-01 ENCOUNTER — Ambulatory Visit (HOSPITAL_COMMUNITY): Payer: Medicare Other | Admitting: Vascular Surgery

## 2018-03-01 ENCOUNTER — Encounter (HOSPITAL_COMMUNITY): Payer: Self-pay | Admitting: *Deleted

## 2018-03-01 ENCOUNTER — Encounter (HOSPITAL_COMMUNITY)
Admission: RE | Disposition: A | Discharge status: Home Health | Payer: Self-pay | Source: Home / Self Care | Attending: Orthopaedic Surgery

## 2018-03-01 ENCOUNTER — Inpatient Hospital Stay (HOSPITAL_COMMUNITY)
Admission: RE | Admit: 2018-03-01 | Discharge: 2018-03-04 | DRG: 470 | Disposition: A | Discharge status: Home Health | Payer: Medicare Other | Attending: Orthopaedic Surgery | Admitting: Orthopaedic Surgery

## 2018-03-01 ENCOUNTER — Observation Stay (HOSPITAL_COMMUNITY): Payer: Medicare Other

## 2018-03-01 DIAGNOSIS — I771 Stricture of artery: Secondary | ICD-10-CM | POA: Diagnosis present

## 2018-03-01 DIAGNOSIS — Z87442 Personal history of urinary calculi: Secondary | ICD-10-CM

## 2018-03-01 DIAGNOSIS — I2510 Atherosclerotic heart disease of native coronary artery without angina pectoris: Secondary | ICD-10-CM | POA: Diagnosis present

## 2018-03-01 DIAGNOSIS — Z9049 Acquired absence of other specified parts of digestive tract: Secondary | ICD-10-CM

## 2018-03-01 DIAGNOSIS — E1159 Type 2 diabetes mellitus with other circulatory complications: Secondary | ICD-10-CM | POA: Diagnosis present

## 2018-03-01 DIAGNOSIS — N521 Erectile dysfunction due to diseases classified elsewhere: Secondary | ICD-10-CM | POA: Diagnosis present

## 2018-03-01 DIAGNOSIS — M1612 Unilateral primary osteoarthritis, left hip: Secondary | ICD-10-CM | POA: Diagnosis present

## 2018-03-01 DIAGNOSIS — Z888 Allergy status to other drugs, medicaments and biological substances status: Secondary | ICD-10-CM

## 2018-03-01 DIAGNOSIS — Z955 Presence of coronary angioplasty implant and graft: Secondary | ICD-10-CM

## 2018-03-01 DIAGNOSIS — Z823 Family history of stroke: Secondary | ICD-10-CM

## 2018-03-01 DIAGNOSIS — E1140 Type 2 diabetes mellitus with diabetic neuropathy, unspecified: Secondary | ICD-10-CM | POA: Diagnosis present

## 2018-03-01 DIAGNOSIS — Z96642 Presence of left artificial hip joint: Secondary | ICD-10-CM

## 2018-03-01 DIAGNOSIS — Z419 Encounter for procedure for purposes other than remedying health state, unspecified: Secondary | ICD-10-CM

## 2018-03-01 DIAGNOSIS — M25752 Osteophyte, left hip: Secondary | ICD-10-CM | POA: Diagnosis present

## 2018-03-01 DIAGNOSIS — I252 Old myocardial infarction: Secondary | ICD-10-CM

## 2018-03-01 DIAGNOSIS — I10 Essential (primary) hypertension: Secondary | ICD-10-CM | POA: Diagnosis present

## 2018-03-01 DIAGNOSIS — Z9181 History of falling: Secondary | ICD-10-CM

## 2018-03-01 DIAGNOSIS — D696 Thrombocytopenia, unspecified: Secondary | ICD-10-CM | POA: Diagnosis present

## 2018-03-01 DIAGNOSIS — Z8249 Family history of ischemic heart disease and other diseases of the circulatory system: Secondary | ICD-10-CM

## 2018-03-01 DIAGNOSIS — Z8673 Personal history of transient ischemic attack (TIA), and cerebral infarction without residual deficits: Secondary | ICD-10-CM

## 2018-03-01 DIAGNOSIS — Z9841 Cataract extraction status, right eye: Secondary | ICD-10-CM

## 2018-03-01 DIAGNOSIS — Z9842 Cataract extraction status, left eye: Secondary | ICD-10-CM

## 2018-03-01 DIAGNOSIS — I482 Chronic atrial fibrillation, unspecified: Secondary | ICD-10-CM | POA: Diagnosis present

## 2018-03-01 DIAGNOSIS — E785 Hyperlipidemia, unspecified: Secondary | ICD-10-CM | POA: Diagnosis present

## 2018-03-01 DIAGNOSIS — Z96641 Presence of right artificial hip joint: Secondary | ICD-10-CM | POA: Diagnosis present

## 2018-03-01 DIAGNOSIS — Z87891 Personal history of nicotine dependence: Secondary | ICD-10-CM

## 2018-03-01 DIAGNOSIS — N400 Enlarged prostate without lower urinary tract symptoms: Secondary | ICD-10-CM | POA: Diagnosis present

## 2018-03-01 HISTORY — PX: TOTAL HIP ARTHROPLASTY: SHX124

## 2018-03-01 LAB — GLUCOSE, CAPILLARY
GLUCOSE-CAPILLARY: 95 mg/dL (ref 70–99)
Glucose-Capillary: 132 mg/dL — ABNORMAL HIGH (ref 70–99)
Glucose-Capillary: 97 mg/dL (ref 70–99)
Glucose-Capillary: 189 mg/dL — ABNORMAL HIGH (ref 70–99)

## 2018-03-01 LAB — PROTIME-INR
INR: 1.07
Prothrombin Time: 13.8 seconds (ref 11.4–15.2)

## 2018-03-01 SURGERY — ARTHROPLASTY, HIP, TOTAL, ANTERIOR APPROACH
Anesthesia: Spinal | Laterality: Left

## 2018-03-01 MED ORDER — CEFAZOLIN SODIUM-DEXTROSE 1-4 GM/50ML-% IV SOLN
1.0000 g | Freq: Four times a day (QID) | INTRAVENOUS | Status: AC
  Administered 2018-03-01 – 2018-03-02 (×2): 1 g via INTRAVENOUS
  Filled 2018-03-01 (×2): qty 50

## 2018-03-01 MED ORDER — WARFARIN SODIUM 5 MG PO TABS
5.0000 mg | ORAL_TABLET | Freq: Once | ORAL | Status: DC
  Filled 2018-03-01: qty 1

## 2018-03-01 MED ORDER — METHOCARBAMOL 500 MG PO TABS
ORAL_TABLET | ORAL | Status: AC
  Administered 2018-03-02
  Filled 2018-03-01: qty 1

## 2018-03-01 MED ORDER — PHENYLEPHRINE 40 MCG/ML (10ML) SYRINGE FOR IV PUSH (FOR BLOOD PRESSURE SUPPORT)
PREFILLED_SYRINGE | INTRAVENOUS | Status: DC | PRN
  Administered 2018-03-01: 80 ug via INTRAVENOUS

## 2018-03-01 MED ORDER — NITROGLYCERIN 0.4 MG SL SUBL: 0.4000 mg | SUBLINGUAL_TABLET | SUBLINGUAL | Status: DC | PRN

## 2018-03-01 MED ORDER — METFORMIN HCL 500 MG PO TABS
1000.0000 mg | ORAL_TABLET | Freq: Two times a day (BID) | ORAL | Status: DC
  Administered 2018-03-02 – 2018-03-04 (×5): 1000 mg via ORAL
  Filled 2018-03-01 (×6): qty 2

## 2018-03-01 MED ORDER — 0.9 % SODIUM CHLORIDE (POUR BTL) OPTIME
TOPICAL | Status: DC | PRN
  Administered 2018-03-01: 1000 mL

## 2018-03-01 MED ORDER — POLYETHYLENE GLYCOL 3350 17 G PO PACK
17.0000 g | PACK | Freq: Every day | ORAL | Status: DC | PRN
  Administered 2018-03-04: 17 g via ORAL
  Filled 2018-03-01 (×2): qty 1

## 2018-03-01 MED ORDER — CEFAZOLIN SODIUM-DEXTROSE 2-4 GM/100ML-% IV SOLN
2.0000 g | INTRAVENOUS | Status: AC
  Administered 2018-03-01: 2 g via INTRAVENOUS

## 2018-03-01 MED ORDER — METHOCARBAMOL 500 MG PO TABS
500.0000 mg | ORAL_TABLET | Freq: Four times a day (QID) | ORAL | Status: DC | PRN
  Administered 2018-03-01 – 2018-03-04 (×5): 500 mg via ORAL
  Filled 2018-03-01 (×4): qty 1

## 2018-03-01 MED ORDER — OXYCODONE HCL 5 MG PO TABS
5.0000 mg | ORAL_TABLET | ORAL | Status: DC | PRN
  Administered 2018-03-01 (×2): 5 mg via ORAL
  Administered 2018-03-02 – 2018-03-04 (×5): 10 mg via ORAL
  Filled 2018-03-01 (×7): qty 2

## 2018-03-01 MED ORDER — ALBUMIN HUMAN 5 % IV SOLN
INTRAVENOUS | Status: DC | PRN
  Administered 2018-03-01: 15:00:00 via INTRAVENOUS

## 2018-03-01 MED ORDER — ALFUZOSIN HCL ER 10 MG PO TB24
10.0000 mg | ORAL_TABLET | Freq: Every day | ORAL | Status: DC
  Administered 2018-03-01 – 2018-03-03 (×3): 10 mg via ORAL
  Filled 2018-03-01 (×4): qty 1

## 2018-03-01 MED ORDER — SODIUM CHLORIDE 0.9 % IR SOLN
Status: DC | PRN
  Administered 2018-03-01: 3000 mL

## 2018-03-01 MED ORDER — CEFAZOLIN SODIUM-DEXTROSE 2-4 GM/100ML-% IV SOLN: 2.0000 g | INTRAVENOUS | Status: DC

## 2018-03-01 MED ORDER — OXYCODONE HCL 5 MG PO TABS
10.0000 mg | ORAL_TABLET | ORAL | Status: DC | PRN
  Administered 2018-03-02 – 2018-03-04 (×2): 10 mg via ORAL

## 2018-03-01 MED ORDER — INSULIN ASPART 100 UNIT/ML ~~LOC~~ SOLN: 0.0000 [IU] | Freq: Every day | SUBCUTANEOUS | Status: DC

## 2018-03-01 MED ORDER — HYDROMORPHONE HCL 1 MG/ML IJ SOLN
0.5000 mg | INTRAMUSCULAR | Status: DC | PRN
  Administered 2018-03-01: 1 mg via INTRAVENOUS
  Filled 2018-03-01: qty 1

## 2018-03-01 MED ORDER — BUPIVACAINE IN DEXTROSE 0.75-8.25 % IT SOLN
INTRATHECAL | Status: DC | PRN
  Administered 2018-03-01: 1.6 mL via INTRATHECAL

## 2018-03-01 MED ORDER — CHLORHEXIDINE GLUCONATE 4 % EX LIQD
60.0000 mL | Freq: Once | CUTANEOUS | Status: DC
  Administered 2018-03-02: 4 via TOPICAL

## 2018-03-01 MED ORDER — PROPOFOL 10 MG/ML IV BOLUS
INTRAVENOUS | Status: DC | PRN
  Administered 2018-03-01: 20 mg via INTRAVENOUS

## 2018-03-01 MED ORDER — WARFARIN - PHARMACIST DOSING INPATIENT: Freq: Every day | Status: DC

## 2018-03-01 MED ORDER — LACTATED RINGERS IV SOLN
Freq: Once | INTRAVENOUS | Status: AC
  Administered 2018-03-01: 13:00:00 via INTRAVENOUS

## 2018-03-01 MED ORDER — ONDANSETRON HCL 4 MG/2ML IJ SOLN
INTRAMUSCULAR | Status: AC
  Filled 2018-03-01: qty 8

## 2018-03-01 MED ORDER — DIPHENHYDRAMINE HCL 12.5 MG/5ML PO ELIX: 12.5000 mg | ORAL_SOLUTION | ORAL | Status: DC | PRN

## 2018-03-01 MED ORDER — METOPROLOL TARTRATE 100 MG PO TABS
100.0000 mg | ORAL_TABLET | Freq: Two times a day (BID) | ORAL | Status: DC
  Administered 2018-03-01 – 2018-03-04 (×4): 100 mg via ORAL
  Filled 2018-03-01 (×6): qty 1

## 2018-03-01 MED ORDER — PANTOPRAZOLE SODIUM 40 MG PO TBEC
40.0000 mg | DELAYED_RELEASE_TABLET | Freq: Every day | ORAL | Status: DC
  Administered 2018-03-01 – 2018-03-04 (×4): 40 mg via ORAL
  Filled 2018-03-01 (×5): qty 1

## 2018-03-01 MED ORDER — LIDOCAINE 2% (20 MG/ML) 5 ML SYRINGE
INTRAMUSCULAR | Status: AC
  Filled 2018-03-01: qty 5

## 2018-03-01 MED ORDER — ACETAMINOPHEN 10 MG/ML IV SOLN
INTRAVENOUS | Status: AC
  Filled 2018-03-01: qty 100

## 2018-03-01 MED ORDER — ACETAMINOPHEN 10 MG/ML IV SOLN
INTRAVENOUS | Status: DC | PRN
  Administered 2018-03-01: 1000 mg via INTRAVENOUS

## 2018-03-01 MED ORDER — SODIUM CHLORIDE 0.9 % IV SOLN
INTRAVENOUS | Status: DC | PRN
  Administered 2018-03-01: 25 ug/min via INTRAVENOUS

## 2018-03-01 MED ORDER — OXYCODONE HCL 5 MG PO TABS
ORAL_TABLET | ORAL | Status: AC
  Administered 2018-03-02
  Filled 2018-03-01: qty 1

## 2018-03-01 MED ORDER — INSULIN ASPART 100 UNIT/ML ~~LOC~~ SOLN
0.0000 [IU] | Freq: Three times a day (TID) | SUBCUTANEOUS | Status: DC
  Administered 2018-03-02 (×2): 5 [IU] via SUBCUTANEOUS
  Administered 2018-03-02: 3 [IU] via SUBCUTANEOUS
  Administered 2018-03-03 – 2018-03-04 (×4): 2 [IU] via SUBCUTANEOUS

## 2018-03-01 MED ORDER — DEXAMETHASONE SODIUM PHOSPHATE 10 MG/ML IJ SOLN
INTRAMUSCULAR | Status: AC
  Filled 2018-03-01: qty 1

## 2018-03-01 MED ORDER — CEFAZOLIN SODIUM-DEXTROSE 2-4 GM/100ML-% IV SOLN
INTRAVENOUS | Status: AC
  Filled 2018-03-01: qty 100

## 2018-03-01 MED ORDER — PROPOFOL 500 MG/50ML IV EMUL
INTRAVENOUS | Status: DC | PRN
  Administered 2018-03-01: 50 ug/kg/min via INTRAVENOUS

## 2018-03-01 MED ORDER — SIMVASTATIN 20 MG PO TABS
40.0000 mg | ORAL_TABLET | Freq: Every evening | ORAL | Status: DC
  Administered 2018-03-01 – 2018-03-03 (×3): 40 mg via ORAL
  Filled 2018-03-01 (×4): qty 2

## 2018-03-01 MED ORDER — FENTANYL CITRATE (PF) 250 MCG/5ML IJ SOLN
INTRAMUSCULAR | Status: AC
  Filled 2018-03-01: qty 5

## 2018-03-01 MED ORDER — ONDANSETRON HCL 4 MG PO TABS
4.0000 mg | ORAL_TABLET | Freq: Four times a day (QID) | ORAL | Status: DC | PRN
  Filled 2018-03-01: qty 1

## 2018-03-01 MED ORDER — MIDAZOLAM HCL 2 MG/2ML IJ SOLN
INTRAMUSCULAR | Status: AC
  Filled 2018-03-01: qty 2

## 2018-03-01 MED ORDER — FENTANYL CITRATE (PF) 250 MCG/5ML IJ SOLN
INTRAMUSCULAR | Status: DC | PRN
  Administered 2018-03-01: 50 ug via INTRAVENOUS

## 2018-03-01 MED ORDER — ONDANSETRON HCL 4 MG/2ML IJ SOLN: 4.0000 mg | Freq: Four times a day (QID) | INTRAMUSCULAR | Status: DC | PRN

## 2018-03-01 MED ORDER — EPHEDRINE SULFATE-NACL 50-0.9 MG/10ML-% IV SOSY
PREFILLED_SYRINGE | INTRAVENOUS | Status: DC | PRN
  Administered 2018-03-01: 5 mg via INTRAVENOUS
  Administered 2018-03-01: 10 mg via INTRAVENOUS
  Administered 2018-03-01: 5 mg via INTRAVENOUS

## 2018-03-01 MED ORDER — SODIUM CHLORIDE 0.9 % IV SOLN
INTRAVENOUS | Status: DC
  Administered 2018-03-01 – 2018-03-03 (×4): via INTRAVENOUS

## 2018-03-01 MED ORDER — GLIPIZIDE 5 MG PO TABS
2.5000 mg | ORAL_TABLET | Freq: Every day | ORAL | Status: DC
  Administered 2018-03-02 – 2018-03-04 (×3): 2.5 mg via ORAL
  Filled 2018-03-01 (×4): qty 1

## 2018-03-01 MED ORDER — ALUM & MAG HYDROXIDE-SIMETH 200-200-20 MG/5ML PO SUSP
30.0000 mL | ORAL | Status: DC | PRN
  Filled 2018-03-01: qty 30

## 2018-03-01 MED ORDER — METHOCARBAMOL 1000 MG/10ML IJ SOLN
500.0000 mg | Freq: Four times a day (QID) | INTRAVENOUS | Status: DC | PRN
  Filled 2018-03-01: qty 5

## 2018-03-01 MED ORDER — MIDAZOLAM HCL 2 MG/2ML IJ SOLN
INTRAMUSCULAR | Status: DC | PRN
  Administered 2018-03-01: 1 mg via INTRAVENOUS

## 2018-03-01 MED ORDER — LACTATED RINGERS IV SOLN
INTRAVENOUS | Status: DC | PRN
  Administered 2018-03-01: 14:00:00 via INTRAVENOUS

## 2018-03-01 MED ORDER — ONDANSETRON HCL 4 MG/2ML IJ SOLN
INTRAMUSCULAR | Status: DC | PRN
  Administered 2018-03-01: 4 mg via INTRAVENOUS

## 2018-03-01 MED ORDER — METOCLOPRAMIDE HCL 5 MG PO TABS
5.0000 mg | ORAL_TABLET | Freq: Three times a day (TID) | ORAL | Status: DC | PRN
  Filled 2018-03-01: qty 2

## 2018-03-01 MED ORDER — ENOXAPARIN SODIUM 100 MG/ML ~~LOC~~ SOLN
100.0000 mg | Freq: Two times a day (BID) | SUBCUTANEOUS | Status: DC
  Filled 2018-03-01 (×2): qty 1

## 2018-03-01 MED ORDER — METOCLOPRAMIDE HCL 5 MG/ML IJ SOLN: 5.0000 mg | Freq: Three times a day (TID) | INTRAMUSCULAR | Status: DC | PRN

## 2018-03-01 MED ORDER — ACETAMINOPHEN 325 MG PO TABS: 325.0000 mg | ORAL_TABLET | Freq: Four times a day (QID) | ORAL | Status: DC | PRN

## 2018-03-01 MED ORDER — PROPOFOL 1000 MG/100ML IV EMUL
INTRAVENOUS | Status: AC
  Filled 2018-03-01: qty 200

## 2018-03-01 MED ORDER — PHENOL 1.4 % MT LIQD
1.0000 | OROMUCOSAL | Status: DC | PRN
  Filled 2018-03-01: qty 177

## 2018-03-01 MED ORDER — WARFARIN SODIUM 5 MG PO TABS
5.0000 mg | ORAL_TABLET | Freq: Once | ORAL | Status: AC
  Administered 2018-03-01: 5 mg via ORAL
  Filled 2018-03-01: qty 1

## 2018-03-01 MED ORDER — DOCUSATE SODIUM 100 MG PO CAPS
100.0000 mg | ORAL_CAPSULE | Freq: Two times a day (BID) | ORAL | Status: DC
  Administered 2018-03-01 – 2018-03-04 (×6): 100 mg via ORAL
  Filled 2018-03-01 (×7): qty 1

## 2018-03-01 MED ORDER — MENTHOL 3 MG MT LOZG
1.0000 | LOZENGE | OROMUCOSAL | Status: DC | PRN
  Filled 2018-03-01: qty 9

## 2018-03-01 SURGICAL SUPPLY — 57 items
ARTICULEZE HEAD (Hips) ×3 IMPLANT
BENZOIN TINCTURE PRP APPL 2/3 (GAUZE/BANDAGES/DRESSINGS) ×3 IMPLANT
BLADE CLIPPER SURG (BLADE) IMPLANT
BLADE SAW SGTL 18X1.27X75 (BLADE) ×2 IMPLANT
BLADE SAW SGTL 18X1.27X75MM (BLADE) ×1
CLOSURE STERI-STRIP 1/2X4 (GAUZE/BANDAGES/DRESSINGS) ×1
CLOSURE WOUND 1/2 X4 (GAUZE/BANDAGES/DRESSINGS) ×2
CLSR STERI-STRIP ANTIMIC 1/2X4 (GAUZE/BANDAGES/DRESSINGS) ×2 IMPLANT
COVER SURGICAL LIGHT HANDLE (MISCELLANEOUS) ×3 IMPLANT
COVER WAND RF STERILE (DRAPES) ×3 IMPLANT
CUP SECTOR GRIPTON 58MM (Orthopedic Implant) ×3 IMPLANT
DRAPE C-ARM 42X72 X-RAY (DRAPES) ×3 IMPLANT
DRAPE STERI IOBAN 125X83 (DRAPES) ×3 IMPLANT
DRAPE U-SHAPE 47X51 STRL (DRAPES) ×9 IMPLANT
DRSG AQUACEL AG ADV 3.5X10 (GAUZE/BANDAGES/DRESSINGS) ×3 IMPLANT
DURAPREP 26ML APPLICATOR (WOUND CARE) ×3 IMPLANT
ELECT BLADE 4.0 EZ CLEAN MEGAD (MISCELLANEOUS) ×3
ELECT BLADE 6.5 EXT (BLADE) IMPLANT
ELECT REM PT RETURN 9FT ADLT (ELECTROSURGICAL) ×3
ELECTRODE BLDE 4.0 EZ CLN MEGD (MISCELLANEOUS) ×1 IMPLANT
ELECTRODE REM PT RTRN 9FT ADLT (ELECTROSURGICAL) ×1 IMPLANT
FACESHIELD WRAPAROUND (MASK) ×6 IMPLANT
GLOVE BIOGEL PI IND STRL 8 (GLOVE) ×2 IMPLANT
GLOVE BIOGEL PI INDICATOR 8 (GLOVE) ×4
GLOVE ECLIPSE 8.0 STRL XLNG CF (GLOVE) ×3 IMPLANT
GLOVE ORTHO TXT STRL SZ7.5 (GLOVE) ×6 IMPLANT
GOWN STRL REUS W/ TWL LRG LVL3 (GOWN DISPOSABLE) ×2 IMPLANT
GOWN STRL REUS W/ TWL XL LVL3 (GOWN DISPOSABLE) ×2 IMPLANT
GOWN STRL REUS W/TWL LRG LVL3 (GOWN DISPOSABLE) ×6
GOWN STRL REUS W/TWL XL LVL3 (GOWN DISPOSABLE) ×4
HANDPIECE INTERPULSE COAX TIP (DISPOSABLE) ×2
HEAD ARTICULEZE (Hips) ×1 IMPLANT
KIT BASIN OR (CUSTOM PROCEDURE TRAY) ×3 IMPLANT
KIT TURNOVER KIT B (KITS) ×3 IMPLANT
LINER NEUTRAL 52X36X58N (Liner) ×3 IMPLANT
MANIFOLD NEPTUNE II (INSTRUMENTS) ×3 IMPLANT
NS IRRIG 1000ML POUR BTL (IV SOLUTION) ×3 IMPLANT
PACK TOTAL JOINT (CUSTOM PROCEDURE TRAY) ×3 IMPLANT
PAD ARMBOARD 7.5X6 YLW CONV (MISCELLANEOUS) ×3 IMPLANT
SCREW 6.5MMX25MM (Screw) ×3 IMPLANT
SET HNDPC FAN SPRY TIP SCT (DISPOSABLE) ×1 IMPLANT
STAPLER VISISTAT 35W (STAPLE) IMPLANT
STEM CORAIL KA12 (Stem) ×3 IMPLANT
STRIP CLOSURE SKIN 1/2X4 (GAUZE/BANDAGES/DRESSINGS) ×4 IMPLANT
SUT ETHIBOND NAB CT1 #1 30IN (SUTURE) ×3 IMPLANT
SUT MNCRL AB 4-0 PS2 18 (SUTURE) IMPLANT
SUT VIC AB 0 CT1 27 (SUTURE) ×3
SUT VIC AB 0 CT1 27XBRD ANBCTR (SUTURE) ×1 IMPLANT
SUT VIC AB 1 CT1 27 (SUTURE) ×2
SUT VIC AB 1 CT1 27XBRD ANBCTR (SUTURE) ×1 IMPLANT
SUT VIC AB 2-0 CT1 27 (SUTURE) ×2
SUT VIC AB 2-0 CT1 TAPERPNT 27 (SUTURE) ×1 IMPLANT
TOWEL OR 17X24 6PK STRL BLUE (TOWEL DISPOSABLE) ×3 IMPLANT
TOWEL OR 17X26 10 PK STRL BLUE (TOWEL DISPOSABLE) ×3 IMPLANT
TRAY CATH 16FR W/PLASTIC CATH (SET/KITS/TRAYS/PACK) IMPLANT
TRAY FOLEY MTR SLVR 16FR STAT (SET/KITS/TRAYS/PACK) IMPLANT
WATER STERILE IRR 1000ML POUR (IV SOLUTION) ×6 IMPLANT

## 2018-03-01 NOTE — H&P (Signed)
TOTAL HIP ADMISSION H&P  Patient is admitted for left total hip arthroplasty.  Subjective:  Chief Complaint: left hip pain  HPI: Eric Gallagher, 78 y.o. male, has a history of pain and functional disability in the left hip(s) due to arthritis and patient has failed non-surgical conservative treatments for greater than 12 weeks to include NSAID's and/or analgesics, corticosteriod injections, flexibility and strengthening excercises, use of assistive devices and activity modification.  Onset of symptoms was gradual starting 3 years ago with gradually worsening course since that time.The patient noted no past surgery on the left hip(s).  Patient currently rates pain in the left hip at 10 out of 10 with activity. Patient has night pain, worsening of pain with activity and weight bearing, trendelenberg gait, pain that interfers with activities of daily living and pain with passive range of motion. Patient has evidence of subchondral cysts, subchondral sclerosis, periarticular osteophytes and joint space narrowing by imaging studies. This condition presents safety issues increasing the risk of falls.  There is no current active infection.  Patient Active Problem List   Diagnosis Date Noted  . Status post total replacement of right hip 01/04/2018  . Unilateral primary osteoarthritis, left hip 12/16/2017  . CVA (cerebral vascular accident) (HCC) 06/25/2016  . HTN (hypertension), benign 06/25/2016  . Dyspnea on exertion 05/29/2014  . Coronary artery disease involving native coronary artery of native heart with angina pectoris (HCC) 05/29/2014  . Erectile dysfunction due to arterial insufficiency 05/29/2014  . Type 2 diabetes mellitus with other circulatory complications (HCC) 05/29/2014  . Chronic atrial fibrillation 05/29/2014   Past Medical History:  Diagnosis Date  . Anginal pain (HCC)    h/o   . Arrhythmia    Chronic AFIB, no anti-coag patient desire and low platelet count  . Arthritis    both hips   . BPH (benign prostatic hyperplasia)   . Chronic a-fib   . Coronary artery disease    NSTEMI in 2005, Taxus DES LAD  . Diabetes mellitus without complication (HCC)    type II, some neuropathy  . Dysrhythmia    afib  . ED (erectile dysfunction)   . History of kidney stones 2012   passed spontaneously  . Hyperlipidemia   . Hypertension   . Old MI (myocardial infarction)   . Stroke (HCC) 2017  . Thrombocytopenia (HCC)    chronic    Past Surgical History:  Procedure Laterality Date  . APPENDECTOMY     as a teen  . CARDIAC CATHETERIZATION N/A 07/24/2014   Procedure: Right/Left Heart Cath and Coronary Angiography;  Surgeon: Lyn RecordsHenry W Smith, MD;  [facility];  Service: Cardiovascular;  Laterality: N/A;  . EYE SURGERY Left 2016   retinal surgery, cataracts removed- both eyes  . HERNIA REPAIR  1998   L inguinal   . I&D EXTREMITY Right 06/18/2014   Procedure: OPEN TREATMENT RIGHT THUMB, INCOMPLETE AMPUTATION.  Washing and dressing applied to left thumb.;  Surgeon: Mack Hookavid Thompson, MD;  [facility];  Service: Orthopedics;  Laterality: Right;  . TOTAL HIP ARTHROPLASTY Right 01/04/2018   Procedure: RIGHT TOTAL HIP ARTHROPLASTY ANTERIOR APPROACH;  Surgeon: Kathryne HitchBlackman, Karesha Trzcinski Y, MD;  Location: [facility] OR;  Service: Orthopedics;  Laterality: Right;    Current Facility-Administered Medications  Medication Dose Route Frequency Provider Last Rate Last Dose  . tranexamic acid (CYKLOKAPRON) IVPB 1,000 mg  1,000 mg Intravenous To OR Kathryne HitchBlackman, Illiana Losurdo Y, MD       Allergies  Allergen Reactions  .  Flomax [Tamsulosin Hcl] Other (See Comments)    Dizzy    Social History   Tobacco Use  . Smoking status: Former Smoker    Last attempt to quit: 12/29/1965    Years since quitting: 52.2  . Smokeless tobacco: Never Used  Substance Use Topics  . Alcohol use: No    Alcohol/week: 0.0 standard drinks    Family History  Problem Relation Age of Onset  . CVA Mother   .  Aneurysm Father   . Heart disease Brother   . Heart disease Brother   . Heart attack Maternal Aunt   . Heart disease Maternal Aunt      Review of Systems  Musculoskeletal: Positive for joint pain.  All other systems reviewed and are negative.   Objective:  Physical Exam  Constitutional: He is oriented to person, place, and time. He appears well-developed and well-nourished.  HENT:  Head: Normocephalic and atraumatic.  Eyes: Pupils are equal, round, and reactive to light. EOM are normal.  Neck: Normal range of motion. Neck supple.  Cardiovascular: Normal rate.  Respiratory: Effort normal.  GI: Soft.  Musculoskeletal:     Left hip: He exhibits decreased range of motion, decreased strength, tenderness and bony tenderness.  Neurological: He is alert and oriented to person, place, and time.  Skin: Skin is warm and dry.  Psychiatric: He has a normal mood and affect.    Vital signs in last 24 hours:    Labs:   Estimated body mass index is 28.27 kg/m as calculated from the following:   Height as of 02/23/18: 6\' 3"  (1.905 m).   Weight as of 02/23/18: 102.6 kg.   Imaging Review Plain radiographs demonstrate severe degenerative joint disease of the left hip(s). The bone quality appears to be good for age and reported activity level.    Preoperative templating of the joint replacement has been completed, documented, and submitted to the Operating Room personnel in order to optimize intra-operative equipment management.     Assessment/Plan:  End stage arthritis, left hip(s)  The patient history, physical examination, clinical judgement of the provider and imaging studies are consistent with end stage degenerative joint disease of the left hip(s) and total hip arthroplasty is deemed medically necessary. The treatment options including medical management, injection therapy, arthroscopy and arthroplasty were discussed at length. The risks and benefits of total hip arthroplasty  were presented and reviewed. The risks due to aseptic loosening, infection, stiffness, dislocation/subluxation,  thromboembolic complications and other imponderables were discussed.  The patient acknowledged the explanation, agreed to proceed with the plan and consent was signed. Patient is being admitted for inpatient treatment for surgery, pain control, PT, OT, prophylactic antibiotics, VTE prophylaxis, progressive ambulation and ADL's and discharge planning.The patient is planning to be discharged home with home health services

## 2018-03-01 NOTE — Op Note (Signed)
NAME: Otho BellowsLINTON, Dima G. MEDICAL RECORD [id number] ACCOUNT [id number] DATE OF BIRTH:[birth year] FACILITY: [facility] LOCATION: [facility] PHYSICIAN:Ezio Wieck Aretha ParrotY. Monica Zahler, MD  OPERATIVE REPORT  DATE OF PROCEDURE:  03/01/2018  PREOPERATIVE DIAGNOSIS:  Primary osteoarthritis and degenerative joint disease, left hip.  POSTOPERATIVE DIAGNOSIS:  Primary osteoarthritis and degenerative joint disease, left hip.  PROCEDURE:  Left total hip arthroplasty through direct anterior approach.  IMPLANTS:  DePuy Sector Gription acetabular component size 58, size 36+0 polyethylene liner, a single screw in the acetabulum on the acetabular component, size 12 Corail femoral component with standard offset, size 36+5 metal hip ball.  SURGEON:  Vanita PandaChristopher Y. Magnus IvanBlackman, MD  ASSISTANT:  Richardean CanalGilbert Clark, PA-C  ANESTHESIA:  Spinal.  ANTIBIOTICS:  Two grams IV Ancef.  ESTIMATED BLOOD LOSS:  150 mL.  COMPLICATIONS:  None.  INDICATIONS:  The patient is a very pleasant 78 year old gentleman with debilitating arthritis involving both his hips.  Just under 3 months ago, he underwent a right total hip arthroplasty through direct anterior approach.  That was very successful and  he has done so well, he wants to go ahead and have his left hip replaced.  His pain is daily on his left hip, and his x-rays show end-stage arthritis.  At this point having had this done before just recently, he is fully aware of the risks and benefits  of this surgery.  DESCRIPTION OF PROCEDURE:  After informed consent was obtained and appropriate left hip was marked, he was brought to the operating room, sat up on a stretcher where spinal anesthesia was then obtained.  He was then laid in the supine position on a  stretcher.  A Foley catheter was placed, and traction boots were placed on both his feet.  Next, he was placed supine on the Hana fracture table, the perineal post in place, and both legs in in-line skeletal traction device and no  traction applied.  His  left operative hip was prepped and draped with DuraPrep and sterile drapes.  A time-out was called, and he was identified as correct patient, correct left hip.  We then made an incision just inferior and posterior to the anterior superior iliac spine.   We carried this obliquely down the leg.  We dissected down tensor fascia lata muscle.  Tensor fascia was then divided longitudinally to proceed with direct anterior approach to the hip.  We identified and cauterized circumflex vessels.  I then identified  the hip capsule, opened up the hip capsule in an L-type format, finding a moderate joint effusion and significant periarticular osteophytes around the femoral head and neck.  We made our femoral neck cut with an oscillating saw just proximal to the  lesser trochanter and completed this with an osteotome.  We placed a corkscrew guide in the femoral head and removed the femoral head in its entirety and found a wide area devoid of cartilage.  I then placed a bent Hohmann over the medial acetabular rim  and removed remnants of the acetabular labrum and other debris from the hip.  We then began reaming in stepwise increments from a size 54 reamer, going up to a size 57 with all reamers under direct visualization, the last reamer under direct fluoroscopy  so we could obtain our depth of reaming, our inclination and anteversion.  I then placed the real DePuy Sector Gription acetabular component size 58 and a single screw and a 36+0 neutral polyethylene liner for that size 58 acetabular component.   Attention was then turned to the femur.  With the leg externally rotated to 120 degrees, extended and adducted, we placed a Mueller retractor medially and a Hohmann retractor behind the greater trochanter.  We released the lateral joint capsule and used  a box-cutting osteotome to enter the femoral canal and a rongeur to lateralize.  We then began broaching from a size 8 broach using Corail  broaching system, going up to a size 12.  With a size 12 in place, we trialed a standard offset femoral neck and  36+1.5 hip ball.  We reduced this easily into the acetabulum, and the leg lengths appeared equal.  I think he needed more offset, but he also needed more length as well.  We dislocated the hip and removed the trial components.  I then placed the real  Corail femoral component with standard offset size 12 and then the real 36+5 metal hip ball, reduced this in the acetabulum.  We were pleased with stability, range of motion, leg length and offset.  We then irrigated the soft tissue with normal saline  solution using pulsatile lavage.  We closed the joint capsule with interrupted #1 Ethibond suture, followed by running #1 Vicryl in tensor fascia, 0 Vicryl in the deep tissue, 2-0 Vicryl subcutaneous tissue, 4-0 Monocryl subcuticular stitch and  Steri-Strips on the skin.  An Aquacel dressing was applied.  He was taken off the Hana table and taken to recovery room in stable condition.  All final counts were correct.  There were no complications noted.  Note Rexene Edison, PA-C, assisted the entire  case.  His assistance was crucial for facilitating all aspects of this case.  LN/NUANCE  D:03/01/2018 T:03/01/2018 JOB:[id number]

## 2018-03-01 NOTE — Brief Op Note (Signed)
03/01/2018  3:51 PM  PATIENT:  Eric Gallagher  78 y.o. male  PRE-OPERATIVE DIAGNOSIS:  osteoarthritis left hip  POST-OPERATIVE DIAGNOSIS:  osteoarthritis left hip  PROCEDURE:  Procedure(s): LEFT TOTAL HIP ARTHROPLASTY ANTERIOR APPROACH (Left)  SURGEON:  Surgeon(s) and Role:    Kathryne Hitch, MD - Primary  PHYSICIAN ASSISTANT:  Rexene Edison, PA-C  ANESTHESIA:   spinal  EBL:  150 mL   COUNTS:  YES  DICTATION: .Other Dictation: Dictation Number [id number]  PLAN OF CARE: Admit to inpatient   PATIENT DISPOSITION:  PACU - hemodynamically stable.   Delay start of Pharmacological VTE agent (>24hrs) due to surgical blood loss or risk of bleeding: no

## 2018-03-01 NOTE — Transfer of Care (Signed)
Immediate Anesthesia Transfer of Care Note  Patient: Eric Gallagher  Procedure(s) Performed: LEFT TOTAL HIP ARTHROPLASTY ANTERIOR APPROACH (Left )  Patient Location: PACU  Anesthesia Type:Regional and Spinal  Level of Consciousness: awake, alert  and oriented  Airway & Oxygen Therapy: Patient Spontanous Breathing and Patient connected to nasal cannula oxygen  Post-op Assessment: Report given to RN and Post -op Vital signs reviewed and stable  Post vital signs: Reviewed and stable  Last Vitals:  Vitals Value Taken Time  BP 116/72 03/01/2018  4:14 PM  Temp    Pulse 59 03/01/2018  4:17 PM  Resp 16 03/01/2018  4:17 PM  SpO2 95 % 03/01/2018  4:17 PM  Vitals shown include unvalidated device data.  Last Pain:  Vitals:   03/01/18 1257  TempSrc:   PainSc: 5       Patients Stated Pain Goal: 3 (03/01/18 1257)  Complications: No apparent anesthesia complications

## 2018-03-01 NOTE — Progress Notes (Signed)
ANTICOAGULATION CONSULT NOTE - Initial Consult  Pharmacy Consult for Coumadin Indication: atrial fibrillation and stroke  Allergies  Allergen Reactions  . Flomax [Tamsulosin Hcl] Other (See Comments)    Dizzy    Patient Measurements: Height: 6\' 3"  (190.5 cm) Weight: 226 lb 3.2 oz (102.6 kg) IBW/kg (Calculated) : 84.5 Vital Signs: Temp: 97 F (36.1 C) (01/21 1715) Temp Source: Oral (01/21 1237) BP: 123/81 (01/21 2000) Pulse Rate: 75 (01/21 2000)  Labs: Recent Labs    03/01/18 1238  LABPROT 13.8  INR 1.07    Estimated Creatinine Clearance: 91.8 mL/min (by C-G formula based on SCr of 0.86 mg/dL).   Medical History: Past Medical History:  Diagnosis Date  . Anginal pain (HCC)    h/o   . Arrhythmia    Chronic AFIB, no anti-coag patient desire and low platelet count  . Arthritis    both hips   . BPH (benign prostatic hyperplasia)   . Chronic a-fib   . Coronary artery disease    NSTEMI in 2005, Taxus DES LAD  . Diabetes mellitus without complication (HCC)    type II, some neuropathy  . Dysrhythmia    afib  . ED (erectile dysfunction)   . History of kidney stones 2012   passed spontaneously  . Hyperlipidemia   . Hypertension   . Old MI (myocardial infarction)   . Stroke (HCC) 2017  . Thrombocytopenia (HCC)    chronic    Assessment: 78 year old male s/p left THA today to resume chronic Coumadin for atrial fibrillation and history of stroke per pharmacy consult.   INR on admission down to 1.07 (Coumadin has been on hold for surgery since 02/24/18). Patient was bridged with Lovenox. Last dose of Lovenox was 02/27/18.  CBC from 1/15 was within normal limits.  Blood loss of noted per op report.  Home regimen: 5mg  po daily except Saturdays.   Goal of Therapy:  INR 2-3 Monitor platelets by anticoagulation protocol: Yes   Plan:  Coumadin 5mg  po x1 tonight.  Daily PT/INR  Link Snuffer, PharmD, BCPS, BCCCP Clinical Pharmacist Please refer to [facility] for  [facility] numbers 03/01/2018,8:33 PM

## 2018-03-01 NOTE — Anesthesia Procedure Notes (Signed)
Spinal  Patient location during procedure: OR Start time: 03/01/2018 2:35 PM End time: 03/01/2018 2:38 PM Staffing Anesthesiologist: Beryle Lathe, MD Performed: anesthesiologist  Preanesthetic Checklist Completed: patient identified, surgical consent, pre-op evaluation, timeout performed, IV checked, risks and benefits discussed and monitors and equipment checked Spinal Block Patient position: sitting Prep: DuraPrep Patient monitoring: heart rate, cardiac monitor, continuous pulse ox and blood pressure Approach: midline Location: L3-4 Injection technique: single-shot Needle Needle type: Pencan  Needle gauge: 24 G Additional Notes Consent was obtained prior to the procedure with all questions answered and concerns addressed. Risks including, but not limited to, bleeding, infection, nerve damage, paralysis, failed block, inadequate analgesia, allergic reaction, high spinal, itching, and headache were discussed and the patient wished to proceed. Functioning IV was confirmed and monitors were applied. Sterile prep and drape, including hand hygiene, mask, and sterile gloves were used. The patient was positioned and the spine was prepped. The skin was anesthetized with lidocaine. Free flow of clear CSF was obtained prior to injecting local anesthetic into the CSF. The spinal needle aspirated freely following injection. The needle was carefully withdrawn. The patient tolerated the procedure well.   Leslye Peer, MD

## 2018-03-02 ENCOUNTER — Encounter (HOSPITAL_COMMUNITY): Payer: Self-pay | Admitting: Orthopaedic Surgery

## 2018-03-02 LAB — BASIC METABOLIC PANEL
Anion gap: 8 (ref 5–15)
BUN: 13 mg/dL (ref 8–23)
CALCIUM: 8.8 mg/dL — AB (ref 8.9–10.3)
CO2: 25 mmol/L (ref 22–32)
Chloride: 101 mmol/L (ref 98–111)
Creatinine, Ser: 0.92 mg/dL (ref 0.61–1.24)
GFR calc Af Amer: >60 mL/min (ref >60)
GFR calc non Af Amer: >60 mL/min (ref >60)
GLUCOSE: 187 mg/dL — AB (ref 70–99)
Potassium: 4.6 mmol/L (ref 3.5–5.1)
Sodium: 134 mmol/L — ABNORMAL LOW (ref 135–145)

## 2018-03-02 LAB — CBC
HCT: 37.4 % — ABNORMAL LOW (ref 39.0–52.0)
Hemoglobin: 11.7 g/dL — ABNORMAL LOW (ref 13.0–17.0)
MCH: 29.3 pg (ref 26.0–34.0)
MCHC: 31.3 g/dL (ref 30.0–36.0)
MCV: 93.5 fL (ref 80.0–100.0)
PLATELETS: 103 10*3/uL — AB (ref 150–400)
RBC: 4 MIL/uL — ABNORMAL LOW (ref 4.22–5.81)
RDW: 13.6 % (ref 11.5–15.5)
WBC: 7.4 10*3/uL (ref 4.0–10.5)
nRBC: 0 % (ref 0.0–0.2)

## 2018-03-02 LAB — PROTIME-INR
INR: 1.25
PROTHROMBIN TIME: 15.6 s — AB (ref 11.4–15.2)

## 2018-03-02 LAB — GLUCOSE, CAPILLARY
Glucose-Capillary: 210 mg/dL — ABNORMAL HIGH (ref 70–99)
Glucose-Capillary: 201 mg/dL — ABNORMAL HIGH (ref 70–99)
Glucose-Capillary: 188 mg/dL — ABNORMAL HIGH (ref 70–99)
Glucose-Capillary: 123 mg/dL — ABNORMAL HIGH (ref 70–99)

## 2018-03-02 MED ORDER — WARFARIN SODIUM 5 MG PO TABS
5.0000 mg | ORAL_TABLET | Freq: Once | ORAL | Status: AC
  Administered 2018-03-02: 5 mg via ORAL
  Filled 2018-03-02: qty 1

## 2018-03-02 MED ORDER — SODIUM CHLORIDE 0.9 % IV BOLUS
500.0000 mL | Freq: Once | INTRAVENOUS | Status: AC
  Administered 2018-03-02: 500 mL via INTRAVENOUS

## 2018-03-02 NOTE — Progress Notes (Signed)
ANTICOAGULATION CONSULT NOTE  Pharmacy Consult:  Coumadin Indication: atrial fibrillation and stroke  Allergies  Allergen Reactions  . Flomax [Tamsulosin Hcl] Other (See Comments)    Dizzy    Patient Measurements: Height: 6\' 3"  (190.5 cm) Weight: 226 lb 3.2 oz (102.6 kg) IBW/kg (Calculated) : 84.5   Vital Signs: Temp: 98.1 F (36.7 C) (01/22 0838) Temp Source: Oral (01/22 0838) BP: 89/42 (01/22 0838) Pulse Rate: 78 (01/22 0838)  Labs: Recent Labs    03/01/18 1238 03/02/18 0217  HGB  --  11.7*  HCT  --  37.4*  PLT  --  103*  LABPROT 13.8 15.6*  INR 1.07 1.25  CREATININE  --  0.92    Estimated Creatinine Clearance: 85.8 mL/min (by C-G formula based on SCr of 0.92 mg/dL).   Assessment: 54 YOM with history of Afib and CVA on Coumadin at home.  His Coumadin was held for surgery and patient was bridged with Lovenox PTA.  Now s/p left THA and Coumadin resumed on 03/01/18.  No Lovenox post-op due to risk of bleeding and patient didn't need it with right THA in 2019 per MD.  INR sub-therapeutic but already started to trend up.  No bleeding reported.  Home Coumadin regimen: 5mg  po daily except Saturdays.   Goal of Therapy:  INR 2-3 Monitor platelets by anticoagulation protocol: Yes   Plan:  Repeat Coumadin 5mg  PO today Daily PT / INR   Jamiria Langill D. Laney Potash, PharmD, BCPS, BCCCP 03/02/2018, 9:59 AM

## 2018-03-02 NOTE — Progress Notes (Signed)
Physical Therapy Treatment Patient Details Name: Eric Gallagher MRN: [id number] DOB: Dec 27, 1940 Today's Date: 03/02/2018    History of Present Illness Pt is a 78 y.o. male s/p elective L THA (direct anterior approach) on 03/01/18. 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    PT Comments    Pt seen for afternoon session with OT secondary to decreased activity tolerance, orthostatic hypotension (see BP values below) and pain. Able to stand and take steps from recliner to bed with RW and minA+2; pt with near-syncopal (?) episode once sitting, returning to supine and able to stay alert. Pt shaking with pain throughout. RN aware. Will continue to follow acutely.  Orthostatic BPs  Reclined in chair 128/69  Upright sitting in chair 115/62  Standing Unable to tolerate  Post-transfer to bed 95/61  Supine 117/70     Follow Up Recommendations  Follow surgeon's recommendation for DC plan and follow-up therapies;Supervision for mobility/OOB     Equipment Recommendations  3in1 (PT)    Recommendations for Other Services       Precautions / Restrictions Precautions Precautions: Fall Precaution Comments: Syncopal (?) episode with PT 1/22 AM; (+) orthostatic hypotension Restrictions Weight Bearing Restrictions: Yes LLE Weight Bearing: Weight bearing as tolerated    Mobility  Bed Mobility Overal bed mobility: Needs Assistance Bed Mobility: Sit to Supine       Sit to supine: Mod assist   General bed mobility comments: ModA to assist BLEs into bed secondary to hip pain; near syncopal episode as pt going sit>supine  Transfers Overall transfer level: Needs assistance Equipment used: Rolling walker (2 wheeled) Transfers: Sit to/from Stand Sit to Stand: Min assist;+2 safety/equipment         General transfer comment: Heavy reliance on BUE support to push into standing; required cues to scoot to edge of recliner and for correct hand placement. Limited by pain and  dizziness  Ambulation/Gait Ambulation/Gait assistance: Min assist;+2 safety/equipment Gait Distance (Feet): 2 Feet Assistive device: Rolling walker (2 wheeled) Gait Pattern/deviations: Step-to pattern;Trunk flexed;Antalgic;Decreased weight shift to left Gait velocity: Decreased   General Gait Details: Took pivotal steps from recliner to bed with RW and minA for balance; maxA to move RW as pt distracted by pain. Pt shaking with pain   Stairs             Wheelchair Mobility    Modified Rankin (Stroke Patients Only)       Balance Overall balance assessment: Needs assistance   Sitting balance-Leahy Scale: Fair       Standing balance-Leahy Scale: Poor Standing balance comment: Reliant on UE support                            Cognition Arousal/Alertness: Awake/alert Behavior During Therapy: WFL for tasks assessed/performed Overall Cognitive Status: Within Functional Limits for tasks assessed                                 General Comments: WFL for simple tasks. [facility]      Exercises      General Comments General comments (skin integrity, edema, etc.): Son Jillyn Hidden) present and supportive. Near-syncopal episode (?) when returning to bed; RN aware      Pertinent Vitals/Pain Pain Assessment: Faces Faces Pain Scale: Hurts whole lot Pain Location: L hip Pain Descriptors / Indicators: Burning;Grimacing;Guarding Pain Intervention(s): Limited activity within patient's tolerance;Monitored during session;Premedicated  before session    Home Living                      Prior Function            PT Goals (current goals can now be found in the care plan section) Acute Rehab PT Goals Patient Stated Goal: Return home PT Goal Formulation: With patient Time For Goal Achievement: 03/16/18 Potential to Achieve Goals: Good Progress towards PT goals: Progressing toward goals    Frequency    7X/week      PT Plan Current plan remains  appropriate    Co-evaluation PT/OT/SLP Co-Evaluation/Treatment: Yes Reason for Co-Treatment: For patient/therapist safety;To address functional/ADL transfers;Other (comment) PT goals addressed during session: Mobility/safety with mobility;Balance;Proper use of DME        AM-PAC PT "6 Clicks" Mobility   Outcome Measure  Help needed turning from your back to your side while in a flat bed without using bedrails?: A Little Help needed moving from lying on your back to sitting on the side of a flat bed without using bedrails?: A Lot Help needed moving to and from a bed to a chair (including a wheelchair)?: A Little Help needed standing up from a chair using your arms (e.g., wheelchair or bedside chair)?: A Little Help needed to walk in hospital room?: A Little Help needed climbing 3-5 steps with a railing? : A Lot 6 Click Score: 16    End of Session Equipment Utilized During Treatment: Gait belt;Oxygen Activity Tolerance: Patient tolerated treatment well;Patient limited by pain;Treatment limited secondary to medical complications (Comment) Patient left: in bed;with call bell/phone within reach;with family/visitor present Nurse Communication: Mobility status PT Visit Diagnosis: Other abnormalities of gait and mobility (R26.89);Pain Pain - Right/Left: Left Pain - part of body: Hip     Time: 7989-2119 PT Time Calculation (min) (ACUTE ONLY): 27 min  Charges:  $Gait Training: 8-22 mins                    Ina Homes, PT, DPT Acute Rehabilitation Services  Pager [phone] Office [phone]  Malachy Chamber 03/02/2018, 4:05 PM

## 2018-03-02 NOTE — Anesthesia Postprocedure Evaluation (Signed)
Anesthesia Post Note  Patient: Eric Gallagher  Procedure(s) Performed: LEFT TOTAL HIP ARTHROPLASTY ANTERIOR APPROACH (Left )     Patient location during evaluation: PACU Anesthesia Type: Spinal Level of consciousness: awake and alert Pain management: pain level controlled Vital Signs Assessment: post-procedure vital signs reviewed and stable Respiratory status: spontaneous breathing and respiratory function stable Cardiovascular status: blood pressure returned to baseline and stable Postop Assessment: spinal receding and no apparent nausea or vomiting Anesthetic complications: no    Last Vitals:  Vitals:   03/02/18 0516 03/02/18 0838  BP: (!) 97/57 (!) 89/42  Pulse: 84 78  Resp:  18  Temp:  36.7 C  SpO2: 95% 94%    Last Pain:  Vitals:   03/02/18 0838  TempSrc: Oral  PainSc: 3                  Beryle Lathe

## 2018-03-02 NOTE — Care Management Note (Signed)
Case Management Note  Patient Details  Name: Eric Gallagher MRN: [id number] Date of Birth: 1940-03-20  Subjective/Objective:    Left THA                Action/Plan: Spoke to pt and son at bedside. Offered choice for HH/CMS list placed on chart and provided to pt. Pt requesting the [facility] agency he has previously. Pt agreeable to [facility] for HH. Has RW. Contacted [facility] for 3n1 bedside commode for home. Has a private duty caregiver that will assist him at home.  PCP Georgann Housekeeper MD   Expected Discharge Date:                Expected Discharge Plan:  Home w Home Health Services  In-House Referral:  NA  Discharge planning Services  CM Consult  Post Acute Care Choice:  Home Health Choice offered to:  Patient  DME Arranged:  3-N-1 DME Agency:  [facility]  HH Arranged:  PT, Nurse's Aide HH Agency:  [facility]  Status of Service:  Completed, signed off  If discussed at [employer] of Stay Meetings, dates discussed:    Additional Comments:  Elliot Cousin, RN 03/02/2018, 11:31 AM

## 2018-03-02 NOTE — Plan of Care (Signed)

## 2018-03-02 NOTE — Evaluation (Signed)
Physical Therapy Evaluation Patient Details Name: Eric Gallagher MRN: [id number] DOB: 11/26/1940 Today's Date: 03/02/2018   History of Present Illness  Pt is a 78 y.o. male s/p elective L THA (direct anterior approach) on 03/01/18. 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     Clinical Impression  Pt presents with an overall decrease in functional mobility secondary to above. PTA, pt indep and lives with wife; has friend support daily as wife uses RW and 24/7 supervision. Educ on precautions, positioning, therex, and importance of mobility. Today, pt able to stand and initiate gait training with RW and minA; pt limited by pain and c/o dizziness with upright mobility (BP 129/63 upon sitting, down to 103/67 after sitting ~5 min; RN/PA aware). Pt would benefit from continued acute PT services to maximize functional mobility and independence prior to d/c.     Follow Up Recommendations Follow surgeon's recommendation for DC plan and follow-up therapies;Supervision for mobility/OOB    Equipment Recommendations  3in1 (PT)    Recommendations for Other Services       Precautions / Restrictions Precautions Precautions: Fall Precaution Comments: Syncopal (?) episode with PT 1/22 Restrictions Weight Bearing Restrictions: Yes LLE Weight Bearing: Weight bearing as tolerated      Mobility  Bed Mobility Overal bed mobility: Needs Assistance Bed Mobility: Supine to Sit     Supine to sit: Supervision;HOB elevated     General bed mobility comments: Significant increased time and effort to come to EOB; reliant on UE support to assist LLE to EOB  Transfers Overall transfer level: Needs assistance Equipment used: Rolling walker (2 wheeled) Transfers: Sit to/from Stand Sit to Stand: Min assist;From elevated surface         General transfer comment: Pt able to stand on 3rd attempt from elevated bed with minA for trunk elevation; guarding significantly secondary to L hip  pain  Ambulation/Gait Ambulation/Gait assistance: Min assist Gait Distance (Feet): 1 Feet Assistive device: Rolling walker (2 wheeled) Gait Pattern/deviations: Step-to pattern;Trunk flexed;Antalgic;Decreased weight shift to left Gait velocity: Decreased   General Gait Details: Took pivotal steps from bed to recliner with RW and minA for balance. Pt with c/o dizziness and needing to sit immediately; minA to control descent  Stairs            Wheelchair Mobility    Modified Rankin (Stroke Patients Only)       Balance Overall balance assessment: Needs assistance   Sitting balance-Leahy Scale: Fair       Standing balance-Leahy Scale: Poor Standing balance comment: Reliant on UE support                             Pertinent Vitals/Pain Pain Assessment: Faces Faces Pain Scale: Hurts even more Pain Location: L hip Pain Descriptors / Indicators: Burning;Grimacing;Guarding Pain Intervention(s): Limited activity within patient's tolerance;Monitored during session    Home Living Family/patient expects to be discharged to:: Private residence Living Arrangements: Spouse/significant other Available Help at Discharge: Family;Friend(s);Available PRN/intermittently Type of Home: House Home Access: Stairs to enter Entrance Stairs-Rails: None Entrance Stairs-Number of Steps: 2 steps in front (can hold onto post), 3 in carport no railings  Home Layout: One level Home Equipment: Walker - 2 wheels;Cane - single point;Bedside commode Additional Comments: Wife ambulatory with RW, wife able to mobilize but requires supervision. Pt and wife have a friend who come to check in daily; friends stays with wife when pt runs errands  Prior Function Level of Independence: Independent               Hand Dominance   Dominant Hand: Right    Extremity/Trunk Assessment   Upper Extremity Assessment Upper Extremity Assessment: Overall WFL for tasks assessed    Lower  Extremity Assessment Lower Extremity Assessment: LLE deficits/detail LLE Deficits / Details: Knee ext 3/5 through partial range; hip flex <3/5 LLE Coordination: decreased gross motor;decreased fine motor    Cervical / Trunk Assessment Cervical / Trunk Assessment: Kyphotic  Communication   Communication: HOH  Cognition Arousal/Alertness: Awake/alert Behavior During Therapy: WFL for tasks assessed/performed Overall Cognitive Status: Within Functional Limits for tasks assessed                                 General Comments: WFL for simple tasks      General Comments General comments (skin integrity, edema, etc.): Son Eric Hidden([place]) present and supportive. Syncopal (?) episode upon returning to sit; RN notified and PA notified    Exercises General Exercises - Lower Extremity Long Arc Quad: AROM;Left;Seated(partial range)   Assessment/Plan    PT Assessment Patient needs continued PT services  PT Problem List Decreased strength;Decreased range of motion;Decreased activity tolerance;Decreased balance;Decreased mobility;Decreased knowledge of use of DME;Decreased knowledge of precautions;Cardiopulmonary status limiting activity;Pain       PT Treatment Interventions DME instruction;Gait training;Stair training;Functional mobility training;Therapeutic activities;Therapeutic exercise;Balance training;Patient/family education    PT Goals (Current goals can be found in the Care Plan section)  Acute Rehab PT Goals Patient Stated Goal: Return home PT Goal Formulation: With patient Time For Goal Achievement: 03/16/18 Potential to Achieve Goals: Good    Frequency 7X/week   Barriers to discharge        Co-evaluation               AM-PAC PT "6 Clicks" Mobility  Outcome Measure Help needed turning from your back to your side while in a flat bed without using bedrails?: A Little Help needed moving from lying on your back to sitting on the side of a flat bed without using  bedrails?: A Little Help needed moving to and from a bed to a chair (including a wheelchair)?: A Little Help needed standing up from a chair using your arms (e.g., wheelchair or bedside chair)?: A Little Help needed to walk in hospital room?: A Little Help needed climbing 3-5 steps with a railing? : A Lot 6 Click Score: 17    End of Session Equipment Utilized During Treatment: Gait belt Activity Tolerance: Patient tolerated treatment well;Patient limited by pain;Treatment limited secondary to medical complications (Comment) Patient left: in chair;with call bell/phone within reach;with chair alarm set;with family/visitor present Nurse Communication: Mobility status PT Visit Diagnosis: Other abnormalities of gait and mobility (R26.89);Pain Pain - Right/Left: Left Pain - part of body: Hip    Time: [id number] PT Time Calculation (min) (ACUTE ONLY): 42 min   Charges:   PT Evaluation $PT Eval Moderate Complexity: 1 Mod PT Treatments $Gait Training: 8-22 mins $Self Care/Home Management: 8-22      Ina HomesJaclyn Yuriko Portales, PT, DPT Acute Rehabilitation Services  Pager [phone] Office [phone]  Malachy ChamberJaclyn L Ruchel Brandenburger 03/02/2018, 10:00 AM

## 2018-03-02 NOTE — Progress Notes (Signed)
ANTICOAGULATION CONSULT NOTE  Pharmacy Consult:  Coumadin Indication: atrial fibrillation and stroke  Allergies  Allergen Reactions  . Flomax [Tamsulosin Hcl] Other (See Comments)    Dizzy    Patient Measurements: Height: 6\' 3"  (190.5 cm) Weight: 226 lb 3.2 oz (102.6 kg) IBW/kg (Calculated) : 84.5   Vital Signs: Temp: 98.1 F (36.7 C) (01/22 0838) Temp Source: Oral (01/22 0838) BP: 104/72 (01/22 0957) Pulse Rate: 73 (01/22 0957)  Labs: Recent Labs    03/01/18 1238 03/02/18 0217  HGB  --  11.7*  HCT  --  37.4*  PLT  --  103*  LABPROT 13.8 15.6*  INR 1.07 1.25  CREATININE  --  0.92    Estimated Creatinine Clearance: 85.8 mL/min (by C-G formula based on SCr of 0.92 mg/dL).   Assessment: 51 YOM with history of Afib and CVA on Coumadin at home.  His Coumadin was held for surgery and patient was bridged with Lovenox PTA.  Now s/p left THA and Coumadin resumed on 03/01/18.  No Lovenox post-op due to risk of bleeding and patient didn't need it with right THA in 2019 per MD.  INR sub-therapeutic but already started to trend up.  Platelet count is trending down, but no bleeding reported.  Home Coumadin regimen: 5mg  po daily except Saturdays.   Goal of Therapy:  INR 2-3 Monitor platelets by anticoagulation protocol: Yes   Plan:  Repeat Coumadin 5mg  PO today Daily PT / INR   Darsh Vandevoort D. Laney Potash, PharmD, BCPS, BCCCP 03/02/2018, 10:03 AM

## 2018-03-02 NOTE — Progress Notes (Signed)
PT called RN to bedside. On RN arrival to room, pt in chair and noted to be drowsy, but responsive to stimuli and nodding appropriately. Rexene EdisonGil Clark, PA contacted and NS 500cc bolus started. Per Bronson CurbGil, metoprolol to be held tonight unless BP is >130/80 and will restart med tomorrow morning. Pt's son remains at bedside. Will continue to monitor.

## 2018-03-02 NOTE — Care Management Obs Status (Signed)
MEDICARE OBSERVATION STATUS NOTIFICATION   Patient Details  Name: Eric Gallagher MRN: [id number] Date of Birth: 03/16/1940   Medicare Observation Status Notification Given:  Yes    Elliot CousinShavis, Dahlton Hinde Ellen, RN 03/02/2018, 11:21 AM

## 2018-03-02 NOTE — Evaluation (Signed)
Occupational Therapy Evaluation Patient Details Name: Eric Gallagher MRN: [id number] DOB: 08/30/1940 Today's Date: 03/02/2018    History of Present Illness Pt is a 78 y.o. male s/p elective L THA (direct anterior approach) on 03/01/18. 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    Clinical Impression   PTA Pt rehabing from THA in November. Pt today continues with drop in blood pressure with positional changes. Min A +2 for stand pivot transfer from recliner to bed, Pt with limited access to LB for ADL due to pain. Son present throughout session. Pt will benefit from skilled OT in the acute setting, and will require 24 hour assist post-acute stay for safety and assist. Next session to focus on continued transfer training as well as LB ADL.     Follow Up Recommendations  Supervision/Assistance - 24 hour;Follow surgeon's recommendation for DC plan and follow-up therapies    Equipment Recommendations  None recommended by OT(Pt has appropriate DME)    Recommendations for Other Services       Precautions / Restrictions Precautions Precautions: Fall Precaution Comments: Syncopal (?) episode with PT 1/22 AM; (+) orthostatic hypotension Restrictions Weight Bearing Restrictions: Yes LLE Weight Bearing: Weight bearing as tolerated      Mobility Bed Mobility Overal bed mobility: Needs Assistance Bed Mobility: Sit to Supine       Sit to supine: Mod assist   General bed mobility comments: ModA to assist BLEs into bed secondary to hip pain; near syncopal episode as pt going sit>supine  Transfers Overall transfer level: Needs assistance Equipment used: Rolling walker (2 wheeled) Transfers: Sit to/from [employer] CorporationStand;Stand Pivot Transfers Sit to Stand: Min assist;+2 safety/equipment Stand pivot transfers: Min assist;+2 physical assistance;+2 safety/equipment       General transfer comment: Heavy reliance on BUE support to push into standing; required cues to scoot to edge of  recliner and for correct hand placement. Limited by pain and dizziness    Balance Overall balance assessment: Needs assistance   Sitting balance-Leahy Scale: Fair       Standing balance-Leahy Scale: Poor Standing balance comment: Reliant on UE support                           ADL either performed or assessed with clinical judgement   ADL Overall ADL's : Needs assistance/impaired Eating/Feeding: Set up;Sitting   Grooming: Set up;Sitting   Upper Body Bathing: Set up;Sitting   Lower Body Bathing: Moderate assistance;Sitting/lateral leans   Upper Body Dressing : Set up   Lower Body Dressing: Moderate assistance   Toilet Transfer: Minimal assistance;+2 for physical assistance;+2 for safety/equipment;Stand-pivot;RW Toilet Transfer Details (indicate cue type and reason): watch BP Toileting- Clothing Manipulation and Hygiene: Min guard;Sitting/lateral lean       Functional mobility during ADLs: Minimal assistance;+2 for physical assistance;+2 for safety/equipment;Rolling walker General ADL Comments: orthostatic with transfers, pain decreasing access to LB for ADL     Vision Patient Visual Report: No change from baseline       Perception     Praxis      Pertinent Vitals/Pain Pain Assessment: Faces Pain Score: 9  Faces Pain Scale: Hurts whole lot Pain Location: L hip Pain Descriptors / Indicators: Burning;Grimacing;Guarding Pain Intervention(s): Monitored during session;Repositioned;Limited activity within patient's tolerance     Hand Dominance Right   Extremity/Trunk Assessment Upper Extremity Assessment Upper Extremity Assessment: Overall WFL for tasks assessed   Lower Extremity Assessment Lower Extremity Assessment: Defer to PT evaluation  Cervical / Trunk Assessment Cervical / Trunk Assessment: Kyphotic   Communication Communication Communication: HOH   Cognition Arousal/Alertness: Awake/alert Behavior During Therapy: WFL for tasks  assessed/performed Overall Cognitive Status: Within Functional Limits for tasks assessed                                 General Comments: HOH   General Comments  Son Jillyn Hidden) present and supportive. Near-syncopal episode (?) when returning to bed; RN aware    Exercises     Shoulder Instructions      Home Living Family/patient expects to be discharged to:: Private residence Living Arrangements: Spouse/significant other Available Help at Discharge: Family;Friend(s);Available PRN/intermittently Type of Home: House Home Access: Stairs to enter [employer] of Steps: 2 steps in front (can hold onto post), 3 in carport no railings  Entrance Stairs-Rails: None Home Layout: One level     Bathroom Shower/Tub: Tub/shower unit;Walk-in shower   Bathroom Toilet: Handicapped height     Home Equipment: Environmental consultant - 2 wheels;Cane - single point;Bedside commode   Additional Comments: Wife ambulatory with RW, wife able to mobilize but requires supervision. Pt and wife have a friend who come to check in daily; friends stays with wife when pt runs errands      Prior Functioning/Environment Level of Independence: Independent                 OT Problem List: Decreased strength;Decreased activity tolerance;Impaired balance (sitting and/or standing);Decreased knowledge of use of DME or AE;Pain      OT Treatment/Interventions: Self-care/ADL training;DME and/or AE instruction;Therapeutic activities;Patient/family education;Balance training    OT Goals(Current goals can be found in the care plan section) Acute Rehab OT Goals Patient Stated Goal: Return home OT Goal Formulation: With patient/family Time For Goal Achievement: 03/16/18 Potential to Achieve Goals: Good ADL Goals Pt Will Perform Grooming: with min guard assist;standing Pt Will Perform Lower Body Bathing: with modified independence;sitting/lateral leans;with adaptive equipment Pt Will Perform Lower Body  Dressing: with supervision;sit to/from stand Pt Will Transfer to Toilet: ambulating;with supervision Pt Will Perform Toileting - Clothing Manipulation and hygiene: with modified independence;sitting/lateral leans Additional ADL Goal #1: Pt will perform bed mobility at mod I level prior to engaging in ADL  OT Frequency: Min 2X/week   Barriers to D/C:            Co-evaluation PT/OT/SLP Co-Evaluation/Treatment: Yes Reason for Co-Treatment: For patient/therapist safety;To address functional/ADL transfers PT goals addressed during session: Mobility/safety with mobility;Balance;Proper use of DME;Strengthening/ROM OT goals addressed during session: ADL's and self-care;Proper use of Adaptive equipment and DME      AM-PAC OT "6 Clicks" Daily Activity     Outcome Measure Help from another person eating meals?: None Help from another person taking care of personal grooming?: None(seated) Help from another person toileting, which includes using toliet, bedpan, or urinal?: A Little Help from another person bathing (including washing, rinsing, drying)?: A Lot Help from another person to put on and taking off regular upper body clothing?: None Help from another person to put on and taking off regular lower body clothing?: A Lot 6 Click Score: 19   End of Session Equipment Utilized During Treatment: Gait belt;Rolling walker Nurse Communication: Mobility status;Other (comment)(orthostatic)  Activity Tolerance: Patient tolerated treatment well Patient left: in bed;with call bell/phone within reach;with family/visitor present  OT Visit Diagnosis: Unsteadiness on feet (R26.81);Other abnormalities of gait and mobility (R26.89);Pain Pain - Right/Left: Left Pain -  part of body: Hip                Time: 3888-7579 OT Time Calculation (min): 31 min Charges:  OT General Charges $OT Visit: 1 Visit OT Evaluation $OT Eval Moderate Complexity: 1 Mod  Sherryl Manges OTR/L Acute Rehabilitation  Services Pager: [phone] Office: [phone]  Evern Bio Alexiz Sustaita 03/02/2018, 4:30 PM

## 2018-03-02 NOTE — Progress Notes (Signed)
OT Cancellation Note  Patient Details Name: Eric Gallagher MRN: [id number] DOB: Apr 14, 1940   Cancelled Treatment:    Reason Eval/Treat Not Completed: Medical issues which prohibited therapy(Syncopal episode with PT. OT will hold for PM)  Evern Bio [facility] 03/02/2018, 9:41 AM  Sherryl Manges OTR/L Acute Rehabilitation Services Pager: [phone] Office: [phone]

## 2018-03-02 NOTE — Progress Notes (Signed)
Orthopedic Tech Progress Note Patient Details:  Otho BellowsDavid G Ksiazek 11/26/1940 [id number] Came to bring the patient 'OHF" but the therapist said he did not need one.  Patient ID: Otho BellowsDavid G Zullo, male   DOB: 05/01/1940, 78 y.o.   MRN: [id number]   Donald PoreSade L Jakara Blatter 03/02/2018, 9:54 AM

## 2018-03-02 NOTE — Progress Notes (Signed)
Subjective: 1 Day Post-Op Procedure(s) (LRB): LEFT TOTAL HIP ARTHROPLASTY ANTERIOR APPROACH (Left) Patient reports pain as moderate.    Objective: Vital signs in last 24 hours: Temp:  [97 F (36.1 C)-98.4 F (36.9 C)] 98.1 F (36.7 C) (01/22 0350) Pulse Rate:  [52-90] 84 (01/22 0516) Resp:  [8-29] 20 (01/22 0350) BP: (89-147)/(53-85) 97/57 (01/22 0516) SpO2:  [92 %-100 %] 95 % (01/22 0516) Weight:  [102.6 kg] 102.6 kg (01/21 1237)  Intake/Output from previous day: 01/21 0701 - 01/22 0700 In: 2600 [I.V.:2300; IV Piggyback:300] Out: 1325 [Urine:1175; Blood:150] Intake/Output this shift: No intake/output data recorded.  Recent Labs    03/02/18 0217  HGB 11.7*   Recent Labs    03/02/18 0217  WBC 7.4  RBC 4.00*  HCT 37.4*  PLT 103*   Recent Labs    03/02/18 0217  NA 134*  K 4.6  CL 101  CO2 25  BUN 13  CREATININE 0.92  GLUCOSE 187*  CALCIUM 8.8*   Recent Labs    03/01/18 1238 03/02/18 0217  INR 1.07 1.25    Sensation intact distally Intact pulses distally Dorsiflexion/Plantar flexion intact Incision: scant drainage  Assessment/Plan: 1 Day Post-Op Procedure(s) (LRB): LEFT TOTAL HIP ARTHROPLASTY ANTERIOR APPROACH (Left) Up with therapy  Will stop Lovenox and just go with Coumadin.  Did not have Lovenox post-op after his other total hip in November.  I'm concern about the risks of bleeding too much on Lovenox.    Kathryne Hitch 03/02/2018, 7:50 AM

## 2018-03-02 NOTE — Discharge Instructions (Addendum)
Information on my medicine - Coumadin®   (Warfarin) ° °This medication education was reviewed with me or my healthcare representative as part of my discharge preparation.   °Why was Coumadin prescribed for you? °Coumadin was prescribed for you because you have a blood clot or a medical condition that can cause an increased risk of forming blood clots. Blood clots can cause serious health problems by blocking the flow of blood to the heart, lung, or brain. Coumadin can prevent harmful blood clots from forming. °As a reminder your indication for Coumadin is:   Stroke Prevention Because Of Atrial Fibrillation ° °What test will check on my response to Coumadin? °While on Coumadin (warfarin) you will need to have an INR test regularly to ensure that your dose is keeping you in the desired range. The INR (international normalized ratio) number is calculated from the result of the laboratory test called prothrombin time (PT). ° °If an INR APPOINTMENT HAS NOT ALREADY BEEN MADE FOR YOU please schedule an appointment to have this lab work done by your health care provider within 7 days. °Your INR goal is usually a number between:  2 to 3 or your provider may give you a more narrow range like 2-2.5.  Ask your health care provider during an office visit what your goal INR is. ° °What  do you need to  know  About  COUMADIN? °Take Coumadin (warfarin) exactly as prescribed by your healthcare provider about the same time each day.  DO NOT stop taking without talking to the doctor who prescribed the medication.  Stopping without other blood clot prevention medication to take the place of Coumadin may increase your risk of developing a new clot or stroke.  Get refills before you run out. ° °What do you do if you miss a dose? °If you miss a dose, take it as soon as you remember on the same day then continue your regularly scheduled regimen the next day.  Do not take two doses of Coumadin at the same time. ° °Important Safety  Information °A possible side effect of Coumadin (Warfarin) is an increased risk of bleeding. You should call your healthcare provider right away if you experience any of the following: °? Bleeding from an injury or your nose that does not stop. °? Unusual colored urine (red or dark brown) or unusual colored stools (red or black). °? Unusual bruising for unknown reasons. °? A serious fall or if you hit your head (even if there is no bleeding). ° °Some foods or medicines interact with Coumadin® (warfarin) and might alter your response to warfarin. To help avoid this: °? Eat a balanced diet, maintaining a consistent amount of Vitamin K. °? Notify your provider about major diet changes you plan to make. °? Avoid alcohol or limit your intake to 1 drink for women and 2 drinks for men per day. °(1 drink is 5 oz. wine, 12 oz. beer, or 1.5 oz. liquor.) ° °Make sure that ANY health care provider who prescribes medication for you knows that you are taking Coumadin (warfarin).  Also make sure the healthcare provider who is monitoring your Coumadin knows when you have started a new medication including herbals and non-prescription products. ° °Coumadin® (Warfarin)  Major Drug Interactions  °Increased Warfarin Effect Decreased Warfarin Effect  °Alcohol (large quantities) °Antibiotics (esp. Septra/Bactrim, Flagyl, Cipro) °Amiodarone (Cordarone) °Aspirin (ASA) °Cimetidine (Tagamet) °Megestrol (Megace) °NSAIDs (ibuprofen, naproxen, etc.) °Piroxicam (Feldene) °Propafenone (Rythmol SR) °Propranolol (Inderal) °Isoniazid (INH) °Posaconazole (Noxafil) Barbiturates (Phenobarbital) °Carbamazepine (  Tegretol) °Chlordiazepoxide (Librium) °Cholestyramine (Questran) °Griseofulvin °Oral Contraceptives °Rifampin °Sucralfate (Carafate) °Vitamin K  ° °Coumadin® (Warfarin) Major Herbal Interactions  °Increased Warfarin Effect Decreased Warfarin Effect  °Garlic °Ginseng °Ginkgo biloba Coenzyme Q10 °Green tea °St. John’s wort   ° °Coumadin® (Warfarin)  FOOD Interactions  °Eat a consistent number of servings per week of foods HIGH in Vitamin K °(1 serving = ½ cup)  °Collards (cooked, or boiled & drained) °Kale (cooked, or boiled & drained) °Mustard greens (cooked, or boiled & drained) °Parsley *serving size only = ¼ cup °Spinach (cooked, or boiled & drained) °Swiss chard (cooked, or boiled & drained) °Turnip greens (cooked, or boiled & drained)  °Eat a consistent number of servings per week of foods MEDIUM-HIGH in Vitamin K °(1 serving = 1 cup)  °Asparagus (cooked, or boiled & drained) °Broccoli (cooked, boiled & drained, or raw & chopped) °Brussel sprouts (cooked, or boiled & drained) *serving size only = ½ cup °Lettuce, raw (green leaf, endive, romaine) °Spinach, raw °Turnip greens, raw & chopped  ° °These websites have more information on Coumadin (warfarin):  www.coumadin.com; °www.ahrq.gov/consumer/coumadin.htm; ° °INSTRUCTIONS AFTER JOINT REPLACEMENT  ° °o Remove items at home which could result in a fall. This includes throw rugs or furniture in walking pathways °o ICE to the affected joint every three hours while awake for 30 minutes at a time, for at least the first 3-5 days, and then as needed for pain and swelling.  Continue to use ice for pain and swelling. You may notice swelling that will progress down to the foot and ankle.  This is normal after surgery.  Elevate your leg when you are not up walking on it.   °o Continue to use the breathing machine you got in the hospital (incentive spirometer) which will help keep your temperature down.  It is common for your temperature to cycle up and down following surgery, especially at night when you are not up moving around and exerting yourself.  The breathing machine keeps your lungs expanded and your temperature down. ° ° °DIET:  As you were doing prior to hospitalization, we recommend a well-balanced diet. ° °DRESSING / WOUND CARE / SHOWERING ° °Keep the surgical dressing until follow up.  The dressing is  water proof, so you can shower without any extra covering.  IF THE DRESSING FALLS OFF or the wound gets wet inside, change the dressing with sterile gauze.  Please use good hand washing techniques before changing the dressing.  Do not use any lotions or creams on the incision until instructed by your surgeon.   ° °ACTIVITY ° °o Increase activity slowly as tolerated, but follow the weight bearing instructions below.   °o No driving for 6 weeks or until further direction given by your physician.  You cannot drive while taking narcotics.  °o No lifting or carrying greater than 10 lbs. until further directed by your surgeon. °o Avoid periods of inactivity such as sitting longer than an hour when not asleep. This helps prevent blood clots.  °o You may return to work once you are authorized by your doctor.  ° ° ° °WEIGHT BEARING  ° °Weight bearing as tolerated with assist device (walker, cane, etc) as directed, use it as long as suggested by your surgeon or therapist, typically at least 4-6 weeks. ° ° °EXERCISES ° °Results after joint replacement surgery are often greatly improved when you follow the exercise, range of motion and muscle strengthening exercises prescribed by your doctor. Safety measures are   also important to protect the joint from further injury. Any time any of these exercises cause you to have increased pain or swelling, decrease what you are doing until you are comfortable again and then slowly increase them. If you have problems or questions, call your caregiver or physical therapist for advice.  ° °Rehabilitation is important following a joint replacement. After just a few days of immobilization, the muscles of the leg can become weakened and shrink (atrophy).  These exercises are designed to build up the tone and strength of the thigh and leg muscles and to improve motion. Often times heat used for twenty to thirty minutes before working out will loosen up your tissues and help with improving the  range of motion but do not use heat for the first two weeks following surgery (sometimes heat can increase post-operative swelling).  ° °These exercises can be done on a training (exercise) mat, on the floor, on a table or on a bed. Use whatever works the best and is most comfortable for you.    Use music or television while you are exercising so that the exercises are a pleasant break in your day. This will make your life better with the exercises acting as a break in your routine that you can look forward to.   Perform all exercises about fifteen times, three times per day or as directed.  You should exercise both the operative leg and the other leg as well. ° °Exercises include: °  °• Quad Sets - Tighten up the muscle on the front of the thigh (Quad) and hold for 5-10 seconds.   °• Straight Leg Raises - With your knee straight (if you were given a brace, keep it on), lift the leg to 60 degrees, hold for 3 seconds, and slowly lower the leg.  Perform this exercise against resistance later as your leg gets stronger.  °• Leg Slides: Lying on your back, slowly slide your foot toward your buttocks, bending your knee up off the floor (only go as far as is comfortable). Then slowly slide your foot back down until your leg is flat on the floor again.  °• Angel Wings: Lying on your back spread your legs to the side as far apart as you can without causing discomfort.  °• Hamstring Strength:  Lying on your back, push your heel against the floor with your leg straight by tightening up the muscles of your buttocks.  Repeat, but this time bend your knee to a comfortable angle, and push your heel against the floor.  You may put a pillow under the heel to make it more comfortable if necessary.  ° °A rehabilitation program following joint replacement surgery can speed recovery and prevent re-injury in the future due to weakened muscles. Contact your doctor or a physical therapist for more information on knee rehabilitation.   ° ° °CONSTIPATION ° °Constipation is defined medically as fewer than three stools per week and severe constipation as less than one stool per week.  Even if you have a regular bowel pattern at home, your normal regimen is likely to be disrupted due to multiple reasons following surgery.  Combination of anesthesia, postoperative narcotics, change in appetite and fluid intake all can affect your bowels.  ° °YOU MUST use at least one of the following options; they are listed in order of increasing strength to get the job done.  They are all available over the counter, and you may need to use some, POSSIBLY even all   of these options:   ° °Drink plenty of fluids (prune juice may be helpful) and high fiber foods °Colace 100 mg by mouth twice a day  °Senokot for constipation as directed and as needed Dulcolax (bisacodyl), take with full glass of water  °Miralax (polyethylene glycol) once or twice a day as needed. ° °If you have tried all these things and are unable to have a bowel movement in the first 3-4 days after surgery call either your surgeon or your primary doctor.   ° °If you experience loose stools or diarrhea, hold the medications until you stool forms back up.  If your symptoms do not get better within 1 week or if they get worse, check with your doctor.  If you experience "the worst abdominal pain ever" or develop nausea or vomiting, please contact the office immediately for further recommendations for treatment. ° ° °ITCHING:  If you experience itching with your medications, try taking only a single pain pill, or even half a pain pill at a time.  You can also use Benadryl over the counter for itching or also to help with sleep.  ° °TED HOSE STOCKINGS:  Use stockings on both legs until for at least 2 weeks or as directed by physician office. They may be removed at night for sleeping. ° °MEDICATIONS:  See your medication summary on the “After Visit Summary” that nursing will review with you.  You may have some  home medications which will be placed on hold until you complete the course of blood thinner medication.  It is important for you to complete the blood thinner medication as prescribed. ° °PRECAUTIONS:  If you experience chest pain or shortness of breath - call 911 immediately for transfer to the hospital emergency department.  ° °If you develop a fever greater that 101 F, purulent drainage from wound, increased redness or drainage from wound, foul odor from the wound/dressing, or calf pain - CONTACT YOUR SURGEON.   °                                                °FOLLOW-UP APPOINTMENTS:  If you do not already have a post-op appointment, please call the office for an appointment to be seen by your surgeon.  Guidelines for how soon to be seen are listed in your “After Visit Summary”, but are typically between 1-4 weeks after surgery. ° °OTHER INSTRUCTIONS:  ° °Knee Replacement:  Do not place pillow under knee, focus on keeping the knee straight while resting. CPM instructions: 0-90 degrees, 2 hours in the morning, 2 hours in the afternoon, and 2 hours in the evening. Place foam block, curve side up under heel at all times except when in CPM or when walking.  DO NOT modify, tear, cut, or change the foam block in any way. ° °MAKE SURE YOU:  °• Understand these instructions.  °• Get help right away if you are not doing well or get worse.  ° ° °Thank you for letting us be a part of your medical care team.  It is a privilege we respect greatly.  We hope these instructions will help you stay on track for a fast and full recovery!  ° °

## 2018-03-03 DIAGNOSIS — Z9842 Cataract extraction status, left eye: Secondary | ICD-10-CM | POA: Diagnosis not present

## 2018-03-03 DIAGNOSIS — N400 Enlarged prostate without lower urinary tract symptoms: Secondary | ICD-10-CM | POA: Diagnosis present

## 2018-03-03 DIAGNOSIS — M1612 Unilateral primary osteoarthritis, left hip: Secondary | ICD-10-CM | POA: Diagnosis present

## 2018-03-03 DIAGNOSIS — D696 Thrombocytopenia, unspecified: Secondary | ICD-10-CM | POA: Diagnosis present

## 2018-03-03 DIAGNOSIS — Z8249 Family history of ischemic heart disease and other diseases of the circulatory system: Secondary | ICD-10-CM | POA: Diagnosis not present

## 2018-03-03 DIAGNOSIS — Z823 Family history of stroke: Secondary | ICD-10-CM | POA: Diagnosis not present

## 2018-03-03 DIAGNOSIS — Z955 Presence of coronary angioplasty implant and graft: Secondary | ICD-10-CM | POA: Diagnosis not present

## 2018-03-03 DIAGNOSIS — Z87442 Personal history of urinary calculi: Secondary | ICD-10-CM | POA: Diagnosis not present

## 2018-03-03 DIAGNOSIS — I482 Chronic atrial fibrillation, unspecified: Secondary | ICD-10-CM | POA: Diagnosis present

## 2018-03-03 DIAGNOSIS — Z87891 Personal history of nicotine dependence: Secondary | ICD-10-CM | POA: Diagnosis not present

## 2018-03-03 DIAGNOSIS — Z8673 Personal history of transient ischemic attack (TIA), and cerebral infarction without residual deficits: Secondary | ICD-10-CM | POA: Diagnosis not present

## 2018-03-03 DIAGNOSIS — Z9841 Cataract extraction status, right eye: Secondary | ICD-10-CM | POA: Diagnosis not present

## 2018-03-03 DIAGNOSIS — M25752 Osteophyte, left hip: Secondary | ICD-10-CM | POA: Diagnosis present

## 2018-03-03 DIAGNOSIS — E1159 Type 2 diabetes mellitus with other circulatory complications: Secondary | ICD-10-CM | POA: Diagnosis present

## 2018-03-03 DIAGNOSIS — I771 Stricture of artery: Secondary | ICD-10-CM | POA: Diagnosis present

## 2018-03-03 DIAGNOSIS — E1140 Type 2 diabetes mellitus with diabetic neuropathy, unspecified: Secondary | ICD-10-CM | POA: Diagnosis present

## 2018-03-03 DIAGNOSIS — I2510 Atherosclerotic heart disease of native coronary artery without angina pectoris: Secondary | ICD-10-CM | POA: Diagnosis present

## 2018-03-03 DIAGNOSIS — N521 Erectile dysfunction due to diseases classified elsewhere: Secondary | ICD-10-CM | POA: Diagnosis present

## 2018-03-03 DIAGNOSIS — I252 Old myocardial infarction: Secondary | ICD-10-CM | POA: Diagnosis not present

## 2018-03-03 DIAGNOSIS — Z9181 History of falling: Secondary | ICD-10-CM | POA: Diagnosis not present

## 2018-03-03 DIAGNOSIS — Z96641 Presence of right artificial hip joint: Secondary | ICD-10-CM | POA: Diagnosis present

## 2018-03-03 DIAGNOSIS — Z9049 Acquired absence of other specified parts of digestive tract: Secondary | ICD-10-CM | POA: Diagnosis not present

## 2018-03-03 DIAGNOSIS — E785 Hyperlipidemia, unspecified: Secondary | ICD-10-CM | POA: Diagnosis present

## 2018-03-03 DIAGNOSIS — I10 Essential (primary) hypertension: Secondary | ICD-10-CM | POA: Diagnosis present

## 2018-03-03 LAB — GLUCOSE, CAPILLARY
Glucose-Capillary: 134 mg/dL — ABNORMAL HIGH (ref 70–99)
Glucose-Capillary: 145 mg/dL — ABNORMAL HIGH (ref 70–99)
Glucose-Capillary: 136 mg/dL — ABNORMAL HIGH (ref 70–99)
Glucose-Capillary: 114 mg/dL — ABNORMAL HIGH (ref 70–99)

## 2018-03-03 LAB — PROTIME-INR
INR: 1.33
Prothrombin Time: 16.4 seconds — ABNORMAL HIGH (ref 11.4–15.2)

## 2018-03-03 MED ORDER — WARFARIN SODIUM 7.5 MG PO TABS
7.5000 mg | ORAL_TABLET | Freq: Once | ORAL | Status: AC
  Administered 2018-03-03: 7.5 mg via ORAL
  Filled 2018-03-03: qty 1

## 2018-03-03 NOTE — Progress Notes (Signed)
Subjective: 2 Days Post-Op Procedure(s) (LRB): LEFT TOTAL HIP ARTHROPLASTY ANTERIOR APPROACH (Left) Patient reports pain as moderate.  Looks better and feels better today, however, does get orthostatic when up.  Objective: Vital signs in last 24 hours: Temp:  [98.6 F (37 C)-99 F (37.2 C)] 98.8 F (37.1 C) (01/23 0448) Pulse Rate:  [76-107] 99 (01/23 0823) Resp:  [15-20] 20 (01/23 0448) BP: (124-141)/(60-74) 131/67 (01/23 0823) SpO2:  [94 %-98 %] 94 % (01/23 0448)  Intake/Output from previous day: 01/22 0701 - 01/23 0700 In: 1983.5 [P.O.:720; I.V.:1263.5] Out: 450 [Urine:450] Intake/Output this shift: Total I/O In: -  Out: 200 [Urine:200]  Recent Labs    03/02/18 0217  HGB 11.7*   Recent Labs    03/02/18 0217  WBC 7.4  RBC 4.00*  HCT 37.4*  PLT 103*   Recent Labs    03/02/18 0217  NA 134*  K 4.6  CL 101  CO2 25  BUN 13  CREATININE 0.92  GLUCOSE 187*  CALCIUM 8.8*   Recent Labs    03/02/18 0217 03/03/18 0339  INR 1.25 1.33    Sensation intact distally Intact pulses distally Dorsiflexion/Plantar flexion intact Incision: scant drainage  Assessment/Plan: 2 Days Post-Op Procedure(s) (LRB): LEFT TOTAL HIP ARTHROPLASTY ANTERIOR APPROACH (Left) Up with therapy Plan for discharge tomorrow Discharge home with home health   HE NEEDS TO STAY TODAY AND BE LISTED NOW AS AN INPATIENT GIVEN HIS BLOOD PRESSURE DROPS WHEN UP AND HIM BEING A FALL RISK.  HE NEEDS ONE MORE DAY FOR HIS BODY TO EQUILIBRATE/STABILIZE.  PHYSICAL THERAPY AGREES WITH THIS AS WELL.  HE IS STILL TOO MUCH A FALL RISK TODAY TO DISCHARGE TO HOME.  SHOULD BE READY BE TOMORROW.    Kathryne Hitch 03/03/2018, 9:59 AM

## 2018-03-03 NOTE — Plan of Care (Signed)
  Problem: Education: Goal: Knowledge of General Education information will improve Description: Including pain rating scale, medication(s)/side effects and non-pharmacologic comfort measures Outcome: Progressing   Problem: Health Behavior/Discharge Planning: Goal: Ability to manage health-related needs will improve Outcome: Progressing   Problem: Clinical Measurements: Goal: Respiratory complications will improve Outcome: Progressing   Problem: Activity: Goal: Risk for activity intolerance will decrease Outcome: Progressing   Problem: Nutrition: Goal: Adequate nutrition will be maintained Outcome: Progressing   Problem: Elimination: Goal: Will not experience complications related to urinary retention Outcome: Progressing   Problem: Pain Managment: Goal: General experience of comfort will improve Outcome: Progressing   Problem: Safety: Goal: Ability to remain free from injury will improve Outcome: Progressing   Problem: Skin Integrity: Goal: Risk for impaired skin integrity will decrease Outcome: Progressing   

## 2018-03-03 NOTE — Progress Notes (Signed)
ANTICOAGULATION CONSULT NOTE  Pharmacy Consult:  Coumadin Indication: atrial fibrillation and stroke  Allergies  Allergen Reactions  . Flomax [Tamsulosin Hcl] Other (See Comments)    Dizzy    Patient Measurements: Height: 6\' 3"  (190.5 cm) Weight: 226 lb 3.2 oz (102.6 kg) IBW/kg (Calculated) : 84.5   Vital Signs: Temp: 98.8 F (37.1 C) (01/23 0448) Temp Source: Oral (01/23 0448) BP: 131/67 (01/23 0823) Pulse Rate: 99 (01/23 0823)  Labs: Recent Labs    03/01/18 1238 03/02/18 0217 03/03/18 0339  HGB  --  11.7*  --   HCT  --  37.4*  --   PLT  --  103*  --   LABPROT 13.8 15.6* 16.4*  INR 1.07 1.25 1.33  CREATININE  --  0.92  --     Estimated Creatinine Clearance: 85.8 mL/min (by C-G formula based on SCr of 0.92 mg/dL).   Assessment: 59 YOM with history of Afib and CVA on Coumadin at home.  His Coumadin was held for surgery and patient was bridged with Lovenox PTA.  Now s/p left THA and Coumadin resumed on 03/01/18.  No Lovenox post-op due to risk of bleeding and patient didn't need it with right THA in 2019 per MD.  INR sub-therapeutic.  Platelet count is trended down 03/02/18, but no bleeding reported.  Home Coumadin regimen: 5mg  po daily except Saturdays.   Goal of Therapy:  INR 2-3 Monitor platelets by anticoagulation protocol: Yes   Plan:  Coumadin 7.5mg  PO today Daily PT / INR   Alvena Kiernan D. Laney Potash, PharmD, BCPS, BCCCP 03/03/2018, 10:25 AM

## 2018-03-03 NOTE — Plan of Care (Signed)

## 2018-03-03 NOTE — Progress Notes (Signed)
Occupational Therapy Treatment Patient Details Name: Eric Gallagher MRN: [id number] DOB: 1940/03/25 Today's Date: 03/03/2018    History of present illness Pt is a 78 y.o. male s/p elective L THA (direct anterior approach) on 03/01/18. 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    OT comments  Pt progressing towards OT goals this session. Pt was able to complete transfers with min guard assist (did require elevated bed and BSC over toilet). Pt continued to required mod A for bed mobility and max A for LB ADL. Pt very motivated and wants to work with therapy. OT will continue to follow acutely.    Follow Up Recommendations  Supervision/Assistance - 24 hour;Follow surgeon's recommendation for DC plan and follow-up therapies    Equipment Recommendations  None recommended by OT(Pt has appropriate DME)    Recommendations for Other Services      Precautions / Restrictions Precautions Precautions: Fall Restrictions Weight Bearing Restrictions: Yes LLE Weight Bearing: Weight bearing as tolerated       Mobility Bed Mobility Overal bed mobility: Needs Assistance Bed Mobility: Sit to Supine     Supine to sit: Supervision;HOB elevated Sit to supine: Mod assist   General bed mobility comments: mod A for getting RLE off EOB, assist for trunk elevation  Transfers Overall transfer level: Needs assistance Equipment used: Rolling walker (2 wheeled) Transfers: Sit to/from Stand Sit to Stand: Min assist;From elevated surface         General transfer comment: bed highly elevated, and BSC over commode    Balance Overall balance assessment: Needs assistance   Sitting balance-Leahy Scale: Fair       Standing balance-Leahy Scale: Poor Standing balance comment: Reliant on UE support                           ADL either performed or assessed with clinical judgement   ADL Overall ADL's : Needs assistance/impaired     Grooming: Wash/dry hands;Set  up;Sitting Grooming Details (indicate cue type and reason): post ambulation                 Toilet Transfer: Min guard;Minimal assistance;Ambulation;RW Toilet Transfer Details (indicate cue type and reason): BSC over commode Toileting- Clothing Manipulation and Hygiene: Min guard;Sitting/lateral lean       Functional mobility during ADLs: Min guard;Minimal assistance;Rolling walker       Vision       Perception     Praxis      Cognition Arousal/Alertness: Awake/alert Behavior During Therapy: WFL for tasks assessed/performed Overall Cognitive Status: Within Functional Limits for tasks assessed                                 General Comments: HOH        Exercises     Shoulder Instructions       General Comments no sycopal episodes today during session    Pertinent Vitals/ Pain       Pain Assessment: Faces Faces Pain Scale: Hurts whole lot Pain Location: L hip Pain Descriptors / Indicators: Burning;Grimacing;Guarding Pain Intervention(s): Monitored during session;Repositioned  Home Living                                          Prior Functioning/Environment  Frequency  Min 2X/week        Progress Toward Goals  OT Goals(current goals can now be found in the care plan section)  Progress towards OT goals: Progressing toward goals  Acute Rehab OT Goals Patient Stated Goal: Return home OT Goal Formulation: With patient/family Time For Goal Achievement: 03/16/18 Potential to Achieve Goals: Good  Plan Discharge plan remains appropriate;Frequency remains appropriate    Co-evaluation                 AM-PAC OT "6 Clicks" Daily Activity     Outcome Measure   Help from another person eating meals?: None Help from another person taking care of personal grooming?: None(seated) Help from another person toileting, which includes using toliet, bedpan, or urinal?: A Little Help from another  person bathing (including washing, rinsing, drying)?: A Lot Help from another person to put on and taking off regular upper body clothing?: None Help from another person to put on and taking off regular lower body clothing?: A Lot 6 Click Score: 19    End of Session Equipment Utilized During Treatment: Gait belt;Rolling walker  OT Visit Diagnosis: Unsteadiness on feet (R26.81);Other abnormalities of gait and mobility (R26.89);Pain Pain - Right/Left: Left Pain - part of body: Hip   Activity Tolerance Patient tolerated treatment well   Patient Left in chair;with call bell/phone within reach;with family/visitor present   Nurse Communication Mobility status        Time: 8676-1950 OT Time Calculation (min): 27 min  Charges: OT General Charges $OT Visit: 1 Visit OT Treatments $Self Care/Home Management : 23-37 mins  Sherryl Manges OTR/L Acute Rehabilitation Services Pager: [phone] Office: [phone]   Eric Gallagher 03/03/2018, 5:19 PM

## 2018-03-03 NOTE — Progress Notes (Signed)
Physical Therapy Treatment Patient Details Name: Eric Gallagher MRN: [id number] DOB: 1940-05-20 Today's Date: 03/03/2018    History of Present Illness Pt is a 78 y.o. male s/p elective L THA (direct anterior approach) on 03/01/18. 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     PT Comments    Pt initially very wary.  We broke the activity into trials to work toward symptom from standing trials.  Emphasis on there ex, transitions and gait.    Follow Up Recommendations  Follow surgeon's recommendation for DC plan and follow-up therapies;Supervision for mobility/OOB     Equipment Recommendations  3in1 (PT)    Recommendations for Other Services       Precautions / Restrictions Precautions Precautions: Fall Restrictions Weight Bearing Restrictions: Yes LLE Weight Bearing: Weight bearing as tolerated    Mobility  Bed Mobility Overal bed mobility: Needs Assistance Bed Mobility: Supine to Sit     Supine to sit: Supervision Sit to supine: Mod assist   General bed mobility comments: pt came up on bil UE's without assist  Transfers Overall transfer level: Needs assistance Equipment used: Rolling walker (2 wheeled) Transfers: Sit to/from Stand Sit to Stand: Min assist;From elevated surface(mod from a lower surface.)         General transfer comment: bed highly elevated, and BSC over commode  Ambulation/Gait Ambulation/Gait assistance: Min assist Gait Distance (Feet): 5 Feet(forward and back and 15 feet) Assistive device: Rolling walker (2 wheeled) Gait Pattern/deviations: Step-to pattern;Trunk flexed Gait velocity: Decreased   General Gait Details: cues for sequencing, heavy use of the RW, step to pattern   Stairs             Wheelchair Mobility    Modified Rankin (Stroke Patients Only)       Balance Overall balance assessment: Needs assistance   Sitting balance-Leahy Scale: Fair       Standing balance-Leahy Scale: Poor Standing  balance comment: Reliant on UE support                            Cognition Arousal/Alertness: Awake/alert Behavior During Therapy: WFL for tasks assessed/performed Overall Cognitive Status: Within Functional Limits for tasks assessed                                 General Comments: HOH      Exercises Total Joint Exercises Ankle Circles/Pumps: AROM;15 reps Quad Sets: AROM;Both;10 reps Gluteal Sets: AAROM;Left;10 reps Straight Leg Raises: AAROM;Left;10 reps    General Comments General comments (skin integrity, edema, etc.): Initial stand, pt felt syncopal, but the next two standing trials were free from symptoms.      Pertinent Vitals/Pain Pain Assessment: Faces Faces Pain Scale: Hurts even more Pain Location: L hip Pain Descriptors / Indicators: Burning;Grimacing;Guarding Pain Intervention(s): Monitored during session    Home Living                      Prior Function            PT Goals (current goals can now be found in the care plan section) Acute Rehab PT Goals Patient Stated Goal: Return home PT Goal Formulation: With patient Time For Goal Achievement: 03/16/18 Potential to Achieve Goals: Good Progress towards PT goals: Progressing toward goals    Frequency    7X/week      PT Plan Current plan  remains appropriate    Co-evaluation              AM-PAC PT "6 Clicks" Mobility   Outcome Measure  Help needed turning from your back to your side while in a flat bed without using bedrails?: A Little Help needed moving from lying on your back to sitting on the side of a flat bed without using bedrails?: A Little Help needed moving to and from a bed to a chair (including a wheelchair)?: A Little Help needed standing up from a chair using your arms (e.g., wheelchair or bedside chair)?: A Little Help needed to walk in hospital room?: A Little Help needed climbing 3-5 steps with a railing? : A Lot 6 Click Score: 17     End of Session         PT Visit Diagnosis: Other abnormalities of gait and mobility (R26.89);Pain Pain - Right/Left: Left Pain - part of body: Hip     Time: 1000-1029 PT Time Calculation (min) (ACUTE ONLY): 29 min  Charges:  $Gait Training: 8-22 mins $Therapeutic Exercise: 8-22 mins                     03/03/2018  [place] Bing, PT Acute Rehabilitation Services [phone]  (pager) [phone]  (office)   Eliseo Gum Shiquita Collignon 03/03/2018, 6:22 PM

## 2018-03-03 NOTE — Progress Notes (Signed)
PM progress note  Emphasis on gait stability/quality/posture and transifer safety.    03/03/18 1800  PT Visit Information  Last PT Received On 03/03/18  Assistance Needed +1  History of Present Illness Pt is a 78 y.o. male s/p elective L THA (direct anterior approach) on 03/01/18. 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   Subjective Data  Patient Stated Goal Return home  Restrictions  LLE Weight Bearing WBAT  Pain Assessment  Pain Assessment Faces  Faces Pain Scale 6  Pain Location L hip  Pain Descriptors / Indicators Burning;Grimacing;Guarding  Pain Intervention(s) Monitored during session  Cognition  Arousal/Alertness Awake/alert  Behavior During Therapy WFL for tasks assessed/performed  Overall Cognitive Status Within Functional Limits for tasks assessed  General Comments HOH  Bed Mobility  Overal bed mobility Needs Assistance  Bed Mobility Sit to Supine  Sit to supine Mod assist  General bed mobility comments needed LE assist  Transfers  Overall transfer level Needs assistance  Equipment used Rolling walker (2 wheeled)  Transfers Sit to/from Stand  Sit to Stand Min assist (from arm chair)  General transfer comment assist to come forward less than boost  Ambulation/Gait  Ambulation/Gait assistance Min assist  Gait Distance (Feet) 40 Feet  Assistive device Rolling walker (2 wheeled)  Gait Pattern/deviations Step-to pattern;Trunk flexed  General Gait Details Continued cues for sequencing, cues for postural checks.  Gait velocity Decreased  Gait velocity interpretation <1.31 ft/sec, indicative of household ambulator  Balance  Sitting balance-Leahy Scale Fair  Standing balance-Leahy Scale Poor  Standing balance comment Reliant on UE support  PT - End of Session  Activity Tolerance Patient tolerated treatment well;Patient limited by pain;Treatment limited secondary to medical complications (Comment)  Patient left in bed;with call bell/phone within  reach;with family/visitor present  Nurse Communication Mobility status   PT - Assessment/Plan  PT Plan Current plan remains appropriate  PT Visit Diagnosis Other abnormalities of gait and mobility (R26.89);Pain  Pain - Right/Left Left  Pain - part of body Hip  PT Frequency (ACUTE ONLY) 7X/week  Follow Up Recommendations Follow surgeon's recommendation for DC plan and follow-up therapies;Supervision for mobility/OOB  PT equipment 3in1 (PT)  AM-PAC PT "6 Clicks" Mobility Outcome Measure (Version 2)  Help needed turning from your back to your side while in a flat bed without using bedrails? 3  Help needed moving from lying on your back to sitting on the side of a flat bed without using bedrails? 3  Help needed moving to and from a bed to a chair (including a wheelchair)? 3  Help needed standing up from a chair using your arms (e.g., wheelchair or bedside chair)? 3  Help needed to walk in hospital room? 3  Help needed climbing 3-5 steps with a railing?  2  6 Click Score 17  Consider Recommendation of Discharge To: Home with [facility]  PT Goal Progression  Progress towards PT goals Progressing toward goals  Acute Rehab PT Goals  PT Goal Formulation With patient  Time For Goal Achievement 03/16/18  Potential to Achieve Goals Good  PT Time Calculation  PT Start Time (ACUTE ONLY) 1410  PT Stop Time (ACUTE ONLY) 1427  PT Time Calculation (min) (ACUTE ONLY) 17 min  PT General Charges  $$ ACUTE PT VISIT 1 Visit  PT Treatments  $Gait Training 8-22 mins   03/03/2018   Bing, PT Acute Rehabilitation Services [phone]  (pager) [phone]  (office)

## 2018-03-04 LAB — PROTIME-INR
INR: 1.41
Prothrombin Time: 17.1 seconds — ABNORMAL HIGH (ref 11.4–15.2)

## 2018-03-04 LAB — GLUCOSE, CAPILLARY: Glucose-Capillary: 149 mg/dL — ABNORMAL HIGH (ref 70–99)

## 2018-03-04 MED ORDER — METHOCARBAMOL 500 MG PO TABS: 500.0000 mg | ORAL_TABLET | Freq: Four times a day (QID) | ORAL | 0 refills | Status: DC | PRN

## 2018-03-04 MED ORDER — OXYCODONE HCL 5 MG PO TABS: 5.0000 mg | ORAL_TABLET | ORAL | 0 refills | Status: DC | PRN

## 2018-03-04 NOTE — Progress Notes (Signed)
Physical Therapy Treatment Patient Details Name: Eric BellowsDavid G Gallagher MRN: [id number] DOB: 08/23/1940 Today's Date: 03/04/2018    History of Present Illness Pt is a 78 y.o. male s/p elective L THA (direct anterior approach) on 03/01/18. 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     PT Comments    Pt performed gait training, stair training and review of supine exercises to prepare for return home today.  Pt tolerated session well.  Plan remains appropriate for return home.   Follow Up Recommendations  Follow surgeon's recommendation for DC plan and follow-up therapies;Supervision for mobility/OOB     Equipment Recommendations  3in1 (PT)    Recommendations for Other Services       Precautions / Restrictions Precautions Precautions: Fall Precaution Comments: Syncopal (?) episode with PT 1/22 AM; (+) orthostatic hypotension Restrictions Weight Bearing Restrictions: Yes LLE Weight Bearing: Weight bearing as tolerated    Mobility  Bed Mobility Overal bed mobility: Needs Assistance Bed Mobility: Supine to Sit     Supine to sit: Supervision     General bed mobility comments: Increased time and heavy reliance on B rails to achieve sitting edge of bed.  He reports he does not have rails at home.  Encouraged attempting without rails and patient unable.    Transfers Overall transfer level: Needs assistance Equipment used: Rolling walker (2 wheeled) Transfers: Sit to/from Stand Sit to Stand: Min guard         General transfer comment: Cues for forward weight shifting to come to standing.  Cues for advancement of LLE forward to improve ease and decrease pain.  Pt also required cueing for eccentric loading.    Ambulation/Gait Ambulation/Gait assistance: Min guard Gait Distance (Feet): 50 Feet Assistive device: Rolling walker (2 wheeled) Gait Pattern/deviations: Step-to pattern;Trunk flexed Gait velocity: Decreased   General Gait Details: Continued cues for  sequencing, cues for postural checks.  Adjusted RW to improve fit.     Stairs Stairs: Yes Stairs assistance: Min assist Stair Management: No rails;Backwards;With walker Number of Stairs: 2 General stair comments: Cues for sequencing and RW placement.     Wheelchair Mobility    Modified Rankin (Stroke Patients Only)       Balance Overall balance assessment: Needs assistance   Sitting balance-Leahy Scale: Fair       Standing balance-Leahy Scale: Poor                              Cognition Arousal/Alertness: Awake/alert Behavior During Therapy: WFL for tasks assessed/performed Overall Cognitive Status: Within Functional Limits for tasks assessed                                 General Comments: HOH      Exercises Total Joint Exercises Ankle Circles/Pumps: AROM;Both;20 reps;Supine Quad Sets: AROM;10 reps;Supine;Left Short Arc Quad: AROM;Left;10 reps;Supine Heel Slides: AROM;Left;10 reps;Supine Hip ABduction/ADduction: AAROM;Left;Supine    General Comments        Pertinent Vitals/Pain Pain Assessment: 0-10 Pain Score: 7  Pain Location: L hip Pain Descriptors / Indicators: Burning;Grimacing;Guarding Pain Intervention(s): Monitored during session    Home Living                      Prior Function            PT Goals (current goals can now be found in the care plan  section) Acute Rehab PT Goals Patient Stated Goal: Return home Potential to Achieve Goals: Good Progress towards PT goals: Progressing toward goals    Frequency    7X/week      PT Plan Current plan remains appropriate    Co-evaluation              AM-PAC PT "6 Clicks" Mobility   Outcome Measure  Help needed turning from your back to your side while in a flat bed without using bedrails?: A Little Help needed moving from lying on your back to sitting on the side of a flat bed without using bedrails?: A Little Help needed moving to and from a  bed to a chair (including a wheelchair)?: A Little Help needed standing up from a chair using your arms (e.g., wheelchair or bedside chair)?: A Little Help needed to walk in hospital room?: A Little Help needed climbing 3-5 steps with a railing? : A Little 6 Click Score: 18    End of Session Equipment Utilized During Treatment: Gait belt Activity Tolerance: Patient tolerated treatment well;Patient limited by fatigue Patient left: in chair;with call bell/phone within reach;with chair alarm set Nurse Communication: Mobility status PT Visit Diagnosis: Other abnormalities of gait and mobility (R26.89);Pain Pain - Right/Left: Left Pain - part of body: Hip     Time: 2952-8413 PT Time Calculation (min) (ACUTE ONLY): 31 min  Charges:  $Gait Training: 8-22 mins $Therapeutic Exercise: 8-22 mins                     Joycelyn Rua, PTA Acute Rehabilitation Services Pager [phone] Office [phone]     Eric Gallagher Artis Delay 03/04/2018, 12:46 PM

## 2018-03-04 NOTE — Progress Notes (Signed)
Patient ID: Otho BellowsDavid G Gallagher, male   DOB: 05/20/1940, 78 y.o.   MRN: [id number] Doing better overall.  Mobilizing better.  Left hip stable.  Vitals stable.  Can be discharged to home today.

## 2018-03-04 NOTE — Discharge Summary (Signed)
Patient ID: Eric Gallagher MRN: [name] DOB/AGE: 79/02/1940 79 y.o.  Admit date: 03/01/2018 Discharge date: 03/04/2018  Admission Diagnoses:  Principal Problem:   Unilateral primary osteoarthritis, left hip Active Problems:   Status post total replacement of left hip   Discharge Diagnoses:  Same  Past Medical History:  Diagnosis Date  . Anginal pain (HCC)    h/o   . Arrhythmia    Chronic AFIB, no anti-coag patient desire and low platelet count  . Arthritis    both hips   . BPH (benign prostatic hyperplasia)   . Chronic a-fib   . Coronary artery disease    NSTEMI in 2005, Taxus DES LAD  . Diabetes mellitus without complication (HCC)    type II, some neuropathy  . Dysrhythmia    afib  . ED (erectile dysfunction)   . History of kidney stones 2012   passed spontaneously  . Hyperlipidemia   . Hypertension   . Old MI (myocardial infarction)   . Stroke (HCC) 2017  . Thrombocytopenia (HCC)    chronic    Surgeries: Procedure(s): LEFT TOTAL HIP ARTHROPLASTY ANTERIOR APPROACH on 03/01/2018   Consultants:   Discharged Condition: Improved  Hospital Course: Eric Gallagher is an 78 y.o. male who was admitted 03/01/2018 for operative treatment ofUnilateral primary osteoarthritis, left hip. Patient has severe unremitting pain that affects sleep, daily activities, and work/hobbies. After pre-op clearance the patient was taken to the operating room on 03/01/2018 and underwent  Procedure(s): LEFT TOTAL HIP ARTHROPLASTY ANTERIOR APPROACH.    Patient was given perioperative antibiotics:  Anti-infectives (From admission, onward)   Start     Dose/Rate Route Frequency Ordered Stop   03/02/18 0600  ceFAZolin (ANCEF) IVPB 2g/100 mL premix     2 g 200 mL/hr over 30 Minutes Intravenous On call to O.R. 03/01/18 1225 03/01/18 1439   03/01/18 2030  ceFAZolin (ANCEF) IVPB 1 g/50 mL premix     1 g 100 mL/hr over 30 Minutes Intravenous Every 6 hours 03/01/18 2027 03/02/18 2336   03/01/18 2027   ceFAZolin (ANCEF) 2-4 GM/100ML-% IVPB    Note to Pharmacy:  Pogarcic, Melissa   : cabinet override      03/01/18 2027 03/02/18 0844   03/01/18 1600  ceFAZolin (ANCEF) IVPB 2g/100 mL premix  Status:  Discontinued     2 g 200 mL/hr over 30 Minutes Intravenous On call to O.R. 03/01/18 1223 03/01/18 2126   03/01/18 1230  ceFAZolin (ANCEF) 2-4 GM/100ML-% IVPB    Note to Pharmacy:  Garen LahBlock, Sarah   : cabinet override      03/01/18 1230 03/01/18 1439       Patient was given sequential compression devices, early ambulation, and chemoprophylaxis to prevent DVT.  Patient benefited maximally from hospital stay and there were no complications.    Recent vital signs:  Patient Vitals for the past 24 hrs:  BP Temp Temp src Pulse Resp SpO2  03/04/18 0402 107/66 98.6 F (37 C) Oral 84 14 92 %  03/03/18 2109 134/68 98.1 F (36.7 C) Oral 95 16 92 %  03/03/18 1246 123/77 98.1 F (36.7 C) Oral 94 16 95 %  03/03/18 0823 131/67 - - 99 - -     Recent laboratory studies:  Recent Labs    03/02/18 0217 03/03/18 0339 03/04/18 0215  WBC 7.4  --   --   HGB 11.7*  --   --   HCT 37.4*  --   --   PLT 103*  --   --  NA 134*  --   --   K 4.6  --   --   CL 101  --   --   CO2 25  --   --   BUN 13  --   --   CREATININE 0.92  --   --   GLUCOSE 187*  --   --   INR 1.25 1.33 1.41  CALCIUM 8.8*  --   --      Discharge Medications:   Allergies as of 03/04/2018      Reactions   Flomax [tamsulosin Hcl] Other (See Comments)   Dizzy      Medication List    TAKE these medications   ACCU-CHEK AVIVA PLUS test strip Generic drug:  glucose blood Use as directed weekly.   ACCU-CHEK SOFTCLIX LANCETS lancets Use as directed weekly.   alfuzosin 10 MG 24 hr tablet Commonly known as:  UROXATRAL Take 10 mg by mouth at bedtime.   enoxaparin 100 MG/ML injection Commonly known as:  LOVENOX Inject 100 mg into the skin every 12 (twelve) hours.   glipiZIDE 5 MG tablet Commonly known as:  GLUCOTROL Take  2.5 mg by mouth at bedtime.   metFORMIN 1000 MG tablet Commonly known as:  GLUCOPHAGE Take 1,000 mg by mouth 2 (two) times daily with a meal.   methocarbamol 500 MG tablet Commonly known as:  ROBAXIN Take 1 tablet (500 mg total) by mouth every 6 (six) hours as needed for muscle spasms.   metoprolol tartrate 100 MG tablet Commonly known as:  LOPRESSOR Take 100 mg by mouth 2 (two) times daily.   nitroGLYCERIN 0.4 MG SL tablet Commonly known as:  NITROSTAT Place 0.4 mg under the tongue every 5 (five) minutes as needed for chest pain.   oxyCODONE 5 MG immediate release tablet Commonly known as:  Oxy IR/ROXICODONE Take 1-2 tablets (5-10 mg total) by mouth every 4 (four) hours as needed for moderate pain (pain score 4-6).   simvastatin 40 MG tablet Commonly known as:  ZOCOR Take 40 mg by mouth every evening.   warfarin 5 MG tablet Commonly known as:  COUMADIN Take 5 mg by mouth See admin instructions. Take 5 mg by mouth daily except for Saturdays.            Durable Medical Equipment  (From admission, onward)         Start     Ordered   03/01/18 2211  DME 3 n 1  Once     03/01/18 2210   03/01/18 2211  DME Walker rolling  Once    Question:  Patient needs a walker to treat with the following condition  Answer:  Status post total replacement of left hip   03/01/18 2210          Diagnostic Studies: Dg Pelvis Portable  Result Date: 03/01/2018 CLINICAL DATA:  Left total hip arthroplasty EXAM: PORTABLE PELVIS 1-2 VIEWS COMPARISON:  01/04/2018 FINDINGS: Left total hip arthroplasty is in place. There is anatomic alignment of the osseous and prostatic structures. There is no breakage or loosening of the hardware. Right total hip arthroplasty is stable in appearance. IMPRESSION: Left total hip arthroplasty is anatomically aligned. Electronically Signed   By: Jolaine Click M.D.   On: 03/01/2018 16:54   Dg C-arm 1-60 Min  Result Date: 03/01/2018 CLINICAL DATA:  78 year old male  post left hip replacement. Initial encounter. EXAM: OPERATIVE left HIP (WITH PELVIS IF PERFORMED) 4 VIEWS TECHNIQUE: Fluoroscopic spot image(s) were submitted for  interpretation post-operatively. Fluoroscopic time: 18 seconds. COMPARISON:  01/04/2018 right hip films. FINDINGS: Post total left hip replacement which appears in satisfactory position without complication noted on frontal projection. IMPRESSION: Post recent left hip replacement.  Remote right hip replacement. Electronically Signed   By: Lacy Duverney M.D.   On: 03/01/2018 15:52   Dg Hip Operative Unilat W Or W/o Pelvis Left  Result Date: 03/01/2018 CLINICAL DATA:  78 year old male post left hip replacement. Initial encounter. EXAM: OPERATIVE left HIP (WITH PELVIS IF PERFORMED) 4 VIEWS TECHNIQUE: Fluoroscopic spot image(s) were submitted for interpretation post-operatively. Fluoroscopic time: 18 seconds. COMPARISON:  01/04/2018 right hip films. FINDINGS: Post total left hip replacement which appears in satisfactory position without complication noted on frontal projection. IMPRESSION: Post recent left hip replacement.  Remote right hip replacement. Electronically Signed   By: Lacy Duverney M.D.   On: 03/01/2018 15:52    Disposition: Discharge disposition: 01-Home or Self Care         Follow-up Information    [facility] Follow up.   Specialty:  Home Health Services Why:  Home Health Physical Therapy-agency will call to arrange initial visit Contact information: [address] [place] [address] [phone]        Kathryne Hitch, MD Follow up.   Specialty:  Orthopedic Surgery Contact information: [address] [place] [address] [phone]            Signed: Kathryne Hitch 03/04/2018, 6:51 AM

## 2018-03-04 NOTE — Progress Notes (Signed)
Pt given discharge instructions and gone over with him. Answered all questions. Equipment delivered to room, all belongings gathered to be sent home.

## 2018-03-08 ENCOUNTER — Telehealth (INDEPENDENT_AMBULATORY_CARE_PROVIDER_SITE_OTHER): Payer: Self-pay | Admitting: Orthopaedic Surgery

## 2018-03-08 NOTE — Telephone Encounter (Signed)
Verbal order given  

## 2018-03-08 NOTE — Telephone Encounter (Signed)
Eric Gallagher (PT) from [facility] called needing verbal orders for HHPT 3 Wk 2. The number to contact Byrd Hesselbach is [phone]

## 2018-03-08 NOTE — Telephone Encounter (Signed)
Maria/[facility]/[phone]  Needed verbal orders: 3x week for 2 weeks  Please call to advise

## 2018-03-11 ENCOUNTER — Telehealth (INDEPENDENT_AMBULATORY_CARE_PROVIDER_SITE_OTHER): Payer: Self-pay

## 2018-03-11 ENCOUNTER — Other Ambulatory Visit (INDEPENDENT_AMBULATORY_CARE_PROVIDER_SITE_OTHER): Payer: Self-pay | Admitting: Orthopaedic Surgery

## 2018-03-11 MED ORDER — OXYCODONE HCL 5 MG PO TABS: 5.0000 mg | ORAL_TABLET | ORAL | 0 refills | Status: DC | PRN

## 2018-03-11 NOTE — Telephone Encounter (Signed)
Patient calling back asking for refill of his oxycodone

## 2018-03-15 ENCOUNTER — Encounter (INDEPENDENT_AMBULATORY_CARE_PROVIDER_SITE_OTHER): Payer: Self-pay | Admitting: Orthopaedic Surgery

## 2018-03-15 ENCOUNTER — Ambulatory Visit (INDEPENDENT_AMBULATORY_CARE_PROVIDER_SITE_OTHER): Payer: Medicare Other | Admitting: Orthopaedic Surgery

## 2018-03-15 DIAGNOSIS — Z96642 Presence of left artificial hip joint: Secondary | ICD-10-CM

## 2018-03-15 NOTE — Progress Notes (Signed)
The patient is here for her first postoperative visit status post a left total hip arthroplasty.  Where she replaced his right hip 70 days ago.  His right hip is much easier for him.  He reports that he has struggled with this left hip compared to the right hip which again was done recently.  He is on Coumadin.  He will report a lot of leg swelling.  He has had a fall since surgery as well.  On exam his hip appears stable.  The incision looks good.  There is no significant seroma at all.  I placed new Steri-Strips as well.  His leg lengths are equal.  He will continue increase his activities as comfort allows.  I gave him reassurance that I think the implant itself seems to be doing well.  He has any issues at all they will let us know.  I would like to see him back in 3 weeks to see how is doing overall.  If he is having still significant issues I would like a low AP pelvis.

## 2018-04-04 ENCOUNTER — Ambulatory Visit (INDEPENDENT_AMBULATORY_CARE_PROVIDER_SITE_OTHER): Payer: Medicare Other

## 2018-04-04 ENCOUNTER — Encounter (INDEPENDENT_AMBULATORY_CARE_PROVIDER_SITE_OTHER): Payer: Self-pay | Admitting: Orthopaedic Surgery

## 2018-04-04 ENCOUNTER — Ambulatory Visit (INDEPENDENT_AMBULATORY_CARE_PROVIDER_SITE_OTHER): Payer: Medicare Other | Admitting: Orthopaedic Surgery

## 2018-04-04 DIAGNOSIS — Z96642 Presence of left artificial hip joint: Secondary | ICD-10-CM | POA: Diagnosis not present

## 2018-04-04 MED ORDER — NAPROXEN 500 MG PO TABS: 500.0000 mg | ORAL_TABLET | Freq: Two times a day (BID) | ORAL | 1 refills | Status: DC | PRN

## 2018-04-04 MED ORDER — OXYCODONE HCL 5 MG PO TABS: 5.0000 mg | ORAL_TABLET | ORAL | 0 refills | Status: DC | PRN

## 2018-04-04 NOTE — Progress Notes (Signed)
Patient is a very pleasant 78 year old gentleman who is now a month out from hip replacement surgery.  He still having problems with thigh pain on the left hip that he did not have any replaced his right hip.  He is ambulate with a walking stick as well.  He hopes to have a refill of oxycodone today.  On exam his exam is benign in terms of good range of motion of both hips.  He does have a deep thigh pain.  His leg lengths are equal.  I cannot elicit any types of popping or instability of the left hip on exam.  An AP pelvis is seen today and were compared to previous films and I see no evidence of fracture or any other abnormality of cortical bone or the hip replacement itself.  Gave reassurance that he should get better with time.  He will continue his walking stick on his opposite side.  I did send in some oxycodone as well as naproxen for him.  I would like to see him back in a month to see how he is doing overall but absolutely no x-rays are needed unless there is worsening acute findings.  If there is we will get 2 views of his left hip but otherwise no x-rays on that visit.  All questions and concerns were asked and encouraged.

## 2018-05-02 ENCOUNTER — Telehealth (INDEPENDENT_AMBULATORY_CARE_PROVIDER_SITE_OTHER): Payer: Self-pay

## 2018-05-02 NOTE — Telephone Encounter (Signed)
Called patient and asked the screening questions.  Do you have now or have you had in the past 7 days a fever and/or chills? NO  Do you have now or have you had in the past 7 days a cough? NO  Do you have now or have you had in the last 7 days nausea, vomiting or abdominal pain? NO  Have you been exposed to anyone who has tested positive for COVID-19? NO  Have you or anyone who lives with you traveled within the last month? NO 

## 2018-05-03 ENCOUNTER — Other Ambulatory Visit: Payer: Self-pay

## 2018-05-03 ENCOUNTER — Encounter (INDEPENDENT_AMBULATORY_CARE_PROVIDER_SITE_OTHER): Payer: Self-pay | Admitting: Orthopaedic Surgery

## 2018-05-03 ENCOUNTER — Ambulatory Visit (INDEPENDENT_AMBULATORY_CARE_PROVIDER_SITE_OTHER): Payer: Medicare Other | Admitting: Orthopaedic Surgery

## 2018-05-03 DIAGNOSIS — Z96642 Presence of left artificial hip joint: Secondary | ICD-10-CM

## 2018-05-03 MED ORDER — GABAPENTIN 300 MG PO CAPS: 300.0000 mg | ORAL_CAPSULE | Freq: Three times a day (TID) | ORAL | 1 refills | Status: DC

## 2018-05-03 NOTE — Progress Notes (Signed)
The patient comes in today now 2 months out from his left total hip arthroplasty.  That was been a little more problematic for him in terms of pain in the thigh and a burning sensation that he did not have when we replaced his right hip.  He is otherwise doing well.  He is walking without assistive device.  He is very active 78 year old.  Again he says the side is just not recovered as quickly as he did with his right side.  On exam he is walking without assistive device and he does not have a significant limp.  He is a bit slow to mobilize when he gets up from a chair.  I put his left hip to internal and external rotation and this caused no difficulties and his knee exam is normal.  He does have some deep thigh pain.  His incision is healed.  At this standpoint I will try Neurontin for his burning pain which will be 100 mg 3 times a day.  We will see him back in 4 weeks to see how he is doing overall.  At that visit I would like just a single standing low AP pelvis.

## 2018-05-29 ENCOUNTER — Other Ambulatory Visit (INDEPENDENT_AMBULATORY_CARE_PROVIDER_SITE_OTHER): Payer: Self-pay | Admitting: Orthopaedic Surgery

## 2018-06-06 ENCOUNTER — Ambulatory Visit (INDEPENDENT_AMBULATORY_CARE_PROVIDER_SITE_OTHER): Payer: Medicare Other | Admitting: Orthopaedic Surgery

## 2018-06-06 ENCOUNTER — Other Ambulatory Visit: Payer: Self-pay

## 2018-06-06 ENCOUNTER — Encounter (INDEPENDENT_AMBULATORY_CARE_PROVIDER_SITE_OTHER): Payer: Self-pay | Admitting: Orthopaedic Surgery

## 2018-06-06 ENCOUNTER — Ambulatory Visit (INDEPENDENT_AMBULATORY_CARE_PROVIDER_SITE_OTHER): Payer: Self-pay

## 2018-06-06 DIAGNOSIS — Z96641 Presence of right artificial hip joint: Secondary | ICD-10-CM | POA: Diagnosis not present

## 2018-06-06 DIAGNOSIS — Z96642 Presence of left artificial hip joint: Secondary | ICD-10-CM

## 2018-06-06 NOTE — Progress Notes (Signed)
Office Visit Note   Patient: Eric Gallagher           Date of Birth: 30-Apr-1940           MRN: [id number] Visit Date: 06/06/2018              Requested by: Georgann Housekeeper, MD [address] [place], [place] [address] PCP: Georgann Housekeeper, MD   Assessment & Plan: Visit Diagnoses:  1. History of left hip replacement   2. History of right hip replacement     Plan: I have given him reassurance that hopefully with time he will continue to make improvements with decreased pain and improve mobility of his hips.  He already says that he is making progress so hopefully over the next 6 months to a year most of the symptoms are resolved.  All question concerns were answered addressed.  We will see him back in 6 months we will have an AP pelvis and lateral both hips at that visit.  Follow-Up Instructions: Return in about 6 months (around 12/06/2018).   Orders:  Orders Placed This Encounter  Procedures  . XR Pelvis 1-2 Views   No orders of the defined types were placed in this encounter.     Procedures: No procedures performed   Clinical Data: No additional findings.   Subjective: Chief Complaint  Patient presents with  . Left Hip - Follow-up  . Right Hip - Follow-up  The patient is now just past 3 months status post a left total hip arthroplasty and between 5 and 6 months status post a right total hip arthroplasty.  He is ambulating without assistive license he is doing much better overall.  His left hip is been more painful and with a slow recovery to get over compared to his right hip.  He still has a burning sensation over the distribution of the lateral femoral cutaneous nerve.  He has been on Neurontin for this.  HPI  Review of Systems He currently denies any headache, chest pain, shortness of breath, fever, chills, nausea, vomiting  Objective: Vital Signs: There were no vitals taken for this visit.  Physical Exam He is alert and orient x3 and in no acute  distress Ortho Exam Examination of both hips show good range of motion overall and his leg lengths are equal.  He does have subjective numbness over the left lateral femoral cutaneous nerve distribution and some pain over the nerve itself. Specialty Comments:  No specialty comments available.  Imaging: Xr Pelvis 1-2 Views  Result Date: 06/06/2018 An AP pelvis and lateral of the left hip shows bilateral total hip arthroplasties with no complicating features.    PMFS History: Patient Active Problem List   Diagnosis Date Noted  . Status post total replacement of left hip 03/01/2018  . Status post total replacement of right hip 01/04/2018  . Unilateral primary osteoarthritis, left hip 12/16/2017  . CVA (cerebral vascular accident) (HCC) 06/25/2016  . HTN (hypertension), benign 06/25/2016  . Dyspnea on exertion 05/29/2014  . Coronary artery disease involving native coronary artery of native heart with angina pectoris (HCC) 05/29/2014  . Erectile dysfunction due to arterial insufficiency 05/29/2014  . Type 2 diabetes mellitus with other circulatory complications (HCC) 05/29/2014  . Chronic atrial fibrillation 05/29/2014   Past Medical History:  Diagnosis Date  . Anginal pain (HCC)    h/o   . Arrhythmia    Chronic AFIB, no anti-coag patient desire and low platelet count  .  Arthritis    both hips   . BPH (benign prostatic hyperplasia)   . Chronic a-fib   . Coronary artery disease    NSTEMI in 2005, Taxus DES LAD  . Diabetes mellitus without complication (HCC)    type II, some neuropathy  . Dysrhythmia    afib  . ED (erectile dysfunction)   . History of kidney stones 2012   passed spontaneously  . Hyperlipidemia   . Hypertension   . Old MI (myocardial infarction)   . Stroke (HCC) 2017  . Thrombocytopenia (HCC)    chronic    Family History  Problem Relation Age of Onset  . CVA Mother   . Aneurysm Father   . Heart disease Brother   . Heart disease Brother   . Heart  attack Maternal Aunt   . Heart disease Maternal Aunt     Past Surgical History:  Procedure Laterality Date  . APPENDECTOMY     as a teen  . CARDIAC CATHETERIZATION N/A 07/24/2014   Procedure: Right/Left Heart Cath and Coronary Angiography;  Surgeon: Lyn RecordsHenry W Smith, MD;  [facility];  Service: Cardiovascular;  Laterality: N/A;  . EYE SURGERY Left 2016   retinal surgery, cataracts removed- both eyes  . HERNIA REPAIR  1998   L inguinal   . I&D EXTREMITY Right 06/18/2014   Procedure: OPEN TREATMENT RIGHT THUMB, INCOMPLETE AMPUTATION.  Washing and dressing applied to left thumb.;  Surgeon: Mack Hookavid Thompson, MD;  [facility];  Service: Orthopedics;  Laterality: Right;  . TOTAL HIP ARTHROPLASTY Right 01/04/2018   Procedure: RIGHT TOTAL HIP ARTHROPLASTY ANTERIOR APPROACH;  Surgeon: Kathryne HitchBlackman, Santanna Whitford Y, MD;  Location: [facility] OR;  Service: Orthopedics;  Laterality: Right;  . TOTAL HIP ARTHROPLASTY Left 03/01/2018   Procedure: LEFT TOTAL HIP ARTHROPLASTY ANTERIOR APPROACH;  Surgeon: Kathryne HitchBlackman, Barbarajean Kinzler Y, MD;  Location: [facility] OR;  Service: Orthopedics;  Laterality: Left;   Social History   Occupational History  . Not on file  Tobacco Use  . Smoking status: Former Smoker    Last attempt to quit: 12/29/1965    Years since quitting: 52.4  . Smokeless tobacco: Never Used  Substance and Sexual Activity  . Alcohol use: No    Alcohol/week: 0.0 standard drinks  . Drug use: No  . Sexual activity: Not on file

## 2018-06-10 ENCOUNTER — Encounter (HOSPITAL_COMMUNITY): Payer: Self-pay | Admitting: Emergency Medicine

## 2018-06-10 ENCOUNTER — Emergency Department (HOSPITAL_COMMUNITY): Payer: Medicare Other

## 2018-06-10 ENCOUNTER — Inpatient Hospital Stay (HOSPITAL_COMMUNITY)
Admission: EM | Admit: 2018-06-10 | Discharge: 2018-06-12 | DRG: 378 | Disposition: A | Discharge status: Home / Self Care | Payer: Medicare Other | Attending: Family Medicine | Admitting: Family Medicine

## 2018-06-10 ENCOUNTER — Other Ambulatory Visit: Payer: Self-pay

## 2018-06-10 DIAGNOSIS — D62 Acute posthemorrhagic anemia: Secondary | ICD-10-CM | POA: Diagnosis not present

## 2018-06-10 DIAGNOSIS — Z87891 Personal history of nicotine dependence: Secondary | ICD-10-CM

## 2018-06-10 DIAGNOSIS — I482 Chronic atrial fibrillation, unspecified: Secondary | ICD-10-CM | POA: Diagnosis not present

## 2018-06-10 DIAGNOSIS — R0602 Shortness of breath: Secondary | ICD-10-CM

## 2018-06-10 DIAGNOSIS — K922 Gastrointestinal hemorrhage, unspecified: Secondary | ICD-10-CM | POA: Diagnosis present

## 2018-06-10 DIAGNOSIS — Z888 Allergy status to other drugs, medicaments and biological substances status: Secondary | ICD-10-CM

## 2018-06-10 DIAGNOSIS — E1159 Type 2 diabetes mellitus with other circulatory complications: Secondary | ICD-10-CM | POA: Diagnosis present

## 2018-06-10 DIAGNOSIS — E785 Hyperlipidemia, unspecified: Secondary | ICD-10-CM | POA: Diagnosis present

## 2018-06-10 DIAGNOSIS — Z8673 Personal history of transient ischemic attack (TIA), and cerebral infarction without residual deficits: Secondary | ICD-10-CM

## 2018-06-10 DIAGNOSIS — I4821 Permanent atrial fibrillation: Secondary | ICD-10-CM | POA: Diagnosis present

## 2018-06-10 DIAGNOSIS — K5731 Diverticulosis of large intestine without perforation or abscess with bleeding: Secondary | ICD-10-CM | POA: Diagnosis not present

## 2018-06-10 DIAGNOSIS — I2510 Atherosclerotic heart disease of native coronary artery without angina pectoris: Secondary | ICD-10-CM | POA: Diagnosis present

## 2018-06-10 DIAGNOSIS — I119 Hypertensive heart disease without heart failure: Secondary | ICD-10-CM | POA: Diagnosis present

## 2018-06-10 DIAGNOSIS — Z7901 Long term (current) use of anticoagulants: Secondary | ICD-10-CM | POA: Diagnosis not present

## 2018-06-10 DIAGNOSIS — E1140 Type 2 diabetes mellitus with diabetic neuropathy, unspecified: Secondary | ICD-10-CM | POA: Diagnosis present

## 2018-06-10 DIAGNOSIS — I959 Hypotension, unspecified: Secondary | ICD-10-CM | POA: Diagnosis present

## 2018-06-10 DIAGNOSIS — Z955 Presence of coronary angioplasty implant and graft: Secondary | ICD-10-CM

## 2018-06-10 DIAGNOSIS — Z86010 Personal history of colonic polyps: Secondary | ICD-10-CM

## 2018-06-10 DIAGNOSIS — D696 Thrombocytopenia, unspecified: Secondary | ICD-10-CM | POA: Diagnosis present

## 2018-06-10 DIAGNOSIS — N400 Enlarged prostate without lower urinary tract symptoms: Secondary | ICD-10-CM | POA: Diagnosis present

## 2018-06-10 DIAGNOSIS — Z7984 Long term (current) use of oral hypoglycemic drugs: Secondary | ICD-10-CM

## 2018-06-10 DIAGNOSIS — E119 Type 2 diabetes mellitus without complications: Secondary | ICD-10-CM | POA: Diagnosis present

## 2018-06-10 DIAGNOSIS — I252 Old myocardial infarction: Secondary | ICD-10-CM

## 2018-06-10 DIAGNOSIS — Z79899 Other long term (current) drug therapy: Secondary | ICD-10-CM

## 2018-06-10 LAB — CBC WITH DIFFERENTIAL/PLATELET
Abs Immature Granulocytes: 0.01 10*3/uL (ref 0.00–0.07)
Basophils Absolute: 0 10*3/uL (ref 0.0–0.1)
Basophils Relative: 1 %
Eosinophils Absolute: 0.2 10*3/uL (ref 0.0–0.5)
Eosinophils Relative: 4 %
HCT: 42.4 % (ref 39.0–52.0)
Hemoglobin: 13.1 g/dL (ref 13.0–17.0)
Immature Granulocytes: 0 %
Lymphocytes Relative: 20 %
Lymphs Abs: 1.2 10*3/uL (ref 0.7–4.0)
MCH: 28.8 pg (ref 26.0–34.0)
MCHC: 30.9 g/dL (ref 30.0–36.0)
MCV: 93.2 fL (ref 80.0–100.0)
Monocytes Absolute: 0.5 10*3/uL (ref 0.1–1.0)
Monocytes Relative: 9 %
Neutro Abs: 3.9 10*3/uL (ref 1.7–7.7)
Neutrophils Relative %: 66 %
Platelets: 152 10*3/uL (ref 150–400)
RBC: 4.55 MIL/uL (ref 4.22–5.81)
RDW: 15.2 % (ref 11.5–15.5)
WBC: 5.9 10*3/uL (ref 4.0–10.5)
nRBC: 0 % (ref 0.0–0.2)

## 2018-06-10 LAB — COMPREHENSIVE METABOLIC PANEL
ALT: 18 U/L (ref 0–44)
AST: 20 U/L (ref 15–41)
Albumin: 4 g/dL (ref 3.5–5.0)
Alkaline Phosphatase: 82 U/L (ref 38–126)
Anion gap: 8 (ref 5–15)
BUN: 14 mg/dL (ref 8–23)
CO2: 27 mmol/L (ref 22–32)
Calcium: 9.4 mg/dL (ref 8.9–10.3)
Chloride: 103 mmol/L (ref 98–111)
Creatinine, Ser: 0.91 mg/dL (ref 0.61–1.24)
GFR calc Af Amer: >60 mL/min (ref >60)
GFR calc non Af Amer: >60 mL/min (ref >60)
Glucose, Bld: 153 mg/dL — ABNORMAL HIGH (ref 70–99)
Potassium: 4.2 mmol/L (ref 3.5–5.1)
Sodium: 138 mmol/L (ref 135–145)
Total Bilirubin: 0.8 mg/dL (ref 0.3–1.2)
Total Protein: 7.3 g/dL (ref 6.5–8.1)

## 2018-06-10 LAB — PROTIME-INR
INR: 2.1 — ABNORMAL HIGH (ref 0.8–1.2)
INR: 1.7 — ABNORMAL HIGH (ref 0.8–1.2)
Prothrombin Time: 23.2 seconds — ABNORMAL HIGH (ref 11.4–15.2)
Prothrombin Time: 19.4 seconds — ABNORMAL HIGH (ref 11.4–15.2)

## 2018-06-10 LAB — ABO/RH: ABO/RH(D): O POS

## 2018-06-10 LAB — CBC
HCT: 41.9 % (ref 39.0–52.0)
Hemoglobin: 13.2 g/dL (ref 13.0–17.0)
MCH: 29.3 pg (ref 26.0–34.0)
MCHC: 31.5 g/dL (ref 30.0–36.0)
MCV: 92.9 fL (ref 80.0–100.0)
Platelets: 148 10*3/uL — ABNORMAL LOW (ref 150–400)
RBC: 4.51 MIL/uL (ref 4.22–5.81)
RDW: 15.2 % (ref 11.5–15.5)
WBC: 5.9 10*3/uL (ref 4.0–10.5)
nRBC: 0 % (ref 0.0–0.2)

## 2018-06-10 LAB — HEMOGLOBIN AND HEMATOCRIT, BLOOD
HCT: 34.2 % — ABNORMAL LOW (ref 39.0–52.0)
Hemoglobin: 11 g/dL — ABNORMAL LOW (ref 13.0–17.0)

## 2018-06-10 LAB — TYPE AND SCREEN
ABO/RH(D): O POS
Antibody Screen: NEGATIVE

## 2018-06-10 LAB — POC OCCULT BLOOD, ED: Fecal Occult Bld: POSITIVE — AB

## 2018-06-10 MED ORDER — SODIUM CHLORIDE 0.9% FLUSH
3.0000 mL | Freq: Two times a day (BID) | INTRAVENOUS | Status: DC
  Administered 2018-06-10 – 2018-06-12 (×5): 3 mL via INTRAVENOUS

## 2018-06-10 MED ORDER — ALBUTEROL SULFATE (2.5 MG/3ML) 0.083% IN NEBU: 2.5000 mg | INHALATION_SOLUTION | RESPIRATORY_TRACT | Status: DC | PRN

## 2018-06-10 MED ORDER — METOPROLOL TARTRATE 25 MG PO TABS: 100.0000 mg | ORAL_TABLET | Freq: Two times a day (BID) | ORAL | Status: DC

## 2018-06-10 MED ORDER — VITAMIN K1 10 MG/ML IJ SOLN
5.0000 mg | Freq: Once | INTRAVENOUS | Status: AC
  Administered 2018-06-10: 09:00:00 5 mg via INTRAVENOUS
  Filled 2018-06-10: qty 0.5

## 2018-06-10 MED ORDER — SODIUM CHLORIDE 0.9 % IV SOLN
10.0000 mL/h | Freq: Once | INTRAVENOUS | Status: AC
  Administered 2018-06-10: 10 mL/h via INTRAVENOUS

## 2018-06-10 MED ORDER — ACETAMINOPHEN 325 MG PO TABS: 650.0000 mg | ORAL_TABLET | Freq: Four times a day (QID) | ORAL | Status: DC | PRN

## 2018-06-10 MED ORDER — ACETAMINOPHEN 650 MG RE SUPP: 650.0000 mg | Freq: Four times a day (QID) | RECTAL | Status: DC | PRN

## 2018-06-10 MED ORDER — SODIUM CHLORIDE 0.9 % IV SOLN
INTRAVENOUS | Status: DC
  Administered 2018-06-10 – 2018-06-11 (×2): via INTRAVENOUS

## 2018-06-10 MED ORDER — PANTOPRAZOLE SODIUM 40 MG IV SOLR
40.0000 mg | Freq: Two times a day (BID) | INTRAVENOUS | Status: DC
  Administered 2018-06-10 – 2018-06-12 (×5): 40 mg via INTRAVENOUS
  Filled 2018-06-10 (×5): qty 40

## 2018-06-10 MED ORDER — SODIUM CHLORIDE 0.9 % IV BOLUS
250.0000 mL | Freq: Once | INTRAVENOUS | Status: AC
  Administered 2018-06-10: 250 mL via INTRAVENOUS

## 2018-06-10 MED ORDER — ONDANSETRON HCL 4 MG/2ML IJ SOLN: 4.0000 mg | Freq: Four times a day (QID) | INTRAMUSCULAR | Status: DC | PRN

## 2018-06-10 MED ORDER — ONDANSETRON HCL 4 MG PO TABS: 4.0000 mg | ORAL_TABLET | Freq: Four times a day (QID) | ORAL | Status: DC | PRN

## 2018-06-10 MED ORDER — BENAZEPRIL HCL 20 MG PO TABS
20.0000 mg | ORAL_TABLET | Freq: Every day | ORAL | Status: DC
  Filled 2018-06-10: qty 1

## 2018-06-10 MED ORDER — AMLODIPINE BESYLATE 5 MG PO TABS: 5.0000 mg | ORAL_TABLET | Freq: Every day | ORAL | Status: DC

## 2018-06-10 MED ORDER — SIMVASTATIN 20 MG PO TABS
40.0000 mg | ORAL_TABLET | Freq: Every evening | ORAL | Status: DC
  Administered 2018-06-10 – 2018-06-11 (×2): 40 mg via ORAL
  Filled 2018-06-10 (×2): qty 2

## 2018-06-10 NOTE — Consult Note (Signed)
Referring Provider: Dr. Katrinka Blazing Primary Care Physician:  Georgann Housekeeper, MD Primary Gastroenterologist:  Gentry Fitz  Reason for Consultation:  Rectal bleeding  HPI: Eric Gallagher is a 78 y.o. male with multiple medical problems on chronic Coumadin for Afib presents with the acute onset of bright red blood per rectum that has occurred multiple times including having 4 episodes since admit to his room. Patient did not see any stool particles with the blood at home and nurse thinks he saw a small amount of stool with the blood. Denies any abdominal pain, dizziness, N/V. Denies any constipation prior to onset of the bleeding. Colonoscopy in June 2009 by Dr. Laural Benes showed sigmoid diverticulosis and 2 small tubular adenomas were removed. Hgb 13.1. INR 2.1.  Past Medical History:  Diagnosis Date  . Anginal pain (HCC)    h/o   . Arrhythmia    Chronic AFIB, no anti-coag patient desire and low platelet count  . Arthritis    both hips   . BPH (benign prostatic hyperplasia)   . Chronic a-fib   . Coronary artery disease    NSTEMI in 2005, Taxus DES LAD  . Diabetes mellitus without complication (HCC)    type II, some neuropathy  . Dysrhythmia    afib  . ED (erectile dysfunction)   . History of kidney stones 2012   passed spontaneously  . Hyperlipidemia   . Hypertension   . Old MI (myocardial infarction)   . Stroke (HCC) 2017  . Thrombocytopenia (HCC)    chronic    Past Surgical History:  Procedure Laterality Date  . APPENDECTOMY     as a teen  . CARDIAC CATHETERIZATION N/A 07/24/2014   Procedure: Right/Left Heart Cath and Coronary Angiography;  Surgeon: Lyn Records, MD;  Location: [facility] INVASIVE CV LAB;  Service: Cardiovascular;  Laterality: N/A;  . EYE SURGERY Left 2016   retinal surgery, cataracts removed- both eyes  . HERNIA REPAIR  1998   L inguinal   . I&D EXTREMITY Right 06/18/2014   Procedure: OPEN TREATMENT RIGHT THUMB, INCOMPLETE AMPUTATION.  Washing and dressing applied to left  thumb.;  Surgeon: Mack Hook, MD;  Location: [facility] OR;  Service: Orthopedics;  Laterality: Right;  . TOTAL HIP ARTHROPLASTY Right 01/04/2018   Procedure: RIGHT TOTAL HIP ARTHROPLASTY ANTERIOR APPROACH;  Surgeon: Kathryne Hitch, MD;  Location: [facility] OR;  Service: Orthopedics;  Laterality: Right;  . TOTAL HIP ARTHROPLASTY Left 03/01/2018   Procedure: LEFT TOTAL HIP ARTHROPLASTY ANTERIOR APPROACH;  Surgeon: Kathryne Hitch, MD;  Location: [facility] OR;  Service: Orthopedics;  Laterality: Left;    Prior to Admission medications   Medication Sig Start Date End Date Taking? Authorizing Provider  alfuzosin (UROXATRAL) 10 MG 24 hr tablet Take 10 mg by mouth at bedtime.  04/15/16  Yes [provider]  amLODipine (NORVASC) 5 MG tablet Take 5 mg by mouth daily. 03/11/18  Yes [provider]  benazepril (LOTENSIN) 20 MG tablet Take 20 mg by mouth daily. 04/19/18  Yes [provider]  glipiZIDE (GLUCOTROL) 5 MG tablet Take 2.5 mg by mouth at bedtime.   Yes [provider]  metFORMIN (GLUCOPHAGE) 1000 MG tablet Take 1,000 mg by mouth 2 (two) times daily with a meal.   Yes [provider]  metoprolol (LOPRESSOR) 100 MG tablet Take 100 mg by mouth 2 (two) times daily.   Yes [provider]  naproxen (NAPROSYN) 500 MG tablet TAKE 1 TABLET BY MOUTH 2 TIMES DAILY BETWEEN MEALS AS NEEDED.  Patient taking differently: Take 500 mg by mouth 2 (two) times daily with a meal.  05/30/18  Yes Kathryne HitchBlackman, Christopher Y, MD  nitroGLYCERIN (NITROSTAT) 0.4 MG SL tablet Place 0.4 mg under the tongue every 5 (five) minutes as needed for chest pain.   Yes [provider]  simvastatin (ZOCOR) 40 MG tablet Take 40 mg by mouth every evening.   Yes [provider]  warfarin (COUMADIN) 5 MG tablet Take 5 mg by mouth See admin instructions. Take 5 mg by mouth daily except for Saturdays. 04/27/16  Yes [provider]  ACCU-CHEK AVIVA PLUS test strip Use as  directed weekly. 03/07/16   [provider]  ACCU-CHEK SOFTCLIX LANCETS lancets Use as directed weekly. 03/11/16   [provider]  gabapentin (NEURONTIN) 300 MG capsule Take 1 capsule (300 mg total) by mouth 3 (three) times daily. Patient not taking: Reported on 06/10/2018 05/03/18   Kathryne HitchBlackman, Christopher Y, MD  methocarbamol (ROBAXIN) 500 MG tablet Take 1 tablet (500 mg total) by mouth every 6 (six) hours as needed for muscle spasms. Patient not taking: Reported on 06/10/2018 03/04/18   Kathryne HitchBlackman, Christopher Y, MD  oxyCODONE (OXY IR/ROXICODONE) 5 MG immediate release tablet Take 1-2 tablets (5-10 mg total) by mouth every 4 (four) hours as needed for moderate pain (pain score 4-6). Patient not taking: Reported on 06/10/2018 04/04/18   Kathryne HitchBlackman, Christopher Y, MD    Scheduled Meds: . pantoprazole (PROTONIX) IV  40 mg Intravenous Q12H  . simvastatin  40 mg Oral QPM  . sodium chloride flush  3 mL Intravenous Q12H   Continuous Infusions: . sodium chloride 50 mL/hr at 06/10/18 1053  . [COMPLETED] phytonadione (VITAMIN K) IV Stopped (06/10/18 1257)   PRN Meds:.acetaminophen **OR** acetaminophen, albuterol, ondansetron **OR** ondansetron (ZOFRAN) IV  Allergies as of 06/10/2018 - Review Complete 06/10/2018  Allergen Reaction Noted  . Flomax [tamsulosin hcl] Other (See Comments) 02/07/2014    Family History  Problem Relation Age of Onset  . CVA Mother   . Aneurysm Father   . Heart disease Brother   . Heart disease Brother   . Heart attack Maternal Aunt   . Heart disease Maternal Aunt     Social History   Socioeconomic History  . Marital status: Married    Spouse name: Not on file  . Number of children: Not on file  . Years of education: Not on file  . Highest education level: Not on file  Occupational History  . Not on file  Social Needs  . Financial resource strain: Not on file  . Food insecurity:    Worry: Not on file    Inability: Not on file  . Transportation  needs:    Medical: Not on file    Non-medical: Not on file  Tobacco Use  . Smoking status: Former Smoker    Last attempt to quit: 12/29/1965    Years since quitting: 52.4  . Smokeless tobacco: Never Used  Substance and Sexual Activity  . Alcohol use: No    Alcohol/week: 0.0 standard drinks  . Drug use: No  . Sexual activity: Not on file  Lifestyle  . Physical activity:    Days per week: Not on file    Minutes per session: Not on file  . Stress: Not on file  Relationships  . Social connections:    Talks on phone: Not on file    Gets together: Not on file    Attends religious service: Not on file  Active member of club or organization: Not on file    Attends meetings of clubs or organizations: Not on file    Relationship status: Not on file  . Intimate partner violence:    Fear of current or ex partner: Not on file    Emotionally abused: Not on file    Physically abused: Not on file    Forced sexual activity: Not on file  Other Topics Concern  . Not on file  Social History Narrative  . Not on file    Review of Systems: All negative except as stated above in HPI.  Physical Exam: Vital signs: Vitals:   06/10/18 1036 06/10/18 1319  BP: 128/73 117/82  Pulse: 79 88  Resp: 20 18  Temp: 98 F (36.7 C) (!) 97.4 F (36.3 C)  SpO2: 100% 99%   Last BM Date: 06/10/18(bright red blood stools ) General:  Lethargic, elderly, well-nourished, pleasant, no acute distress Head: normocephalic, atraumatic Eyes: anicteric sclera ENT: oropharynx clear Neck: supple, nontender Lungs:  Clear throughout to auscultation.   No wheezes, crackles, or rhonchi. No acute distress. Heart:  Regular rate and rhythm; no murmurs, clicks, rubs,  or gallops. Abdomen: LLQ tenderness with guarding, otherwise nontender, soft, nondistended, +BS  Rectal:  Deferred Ext: no edema  GI:  Lab Results: Recent Labs    06/10/18 0535  WBC 5.9  5.9  HGB 13.1  13.2  HCT 42.4  41.9  PLT 152  148*    BMET Recent Labs    06/10/18 0535  NA 138  K 4.2  CL 103  CO2 27  GLUCOSE 153*  BUN 14  CREATININE 0.91  CALCIUM 9.4   LFT Recent Labs    06/10/18 0535  PROT 7.3  ALBUMIN 4.0  AST 20  ALT 18  ALKPHOS 82  BILITOT 0.8   PT/INR Recent Labs    06/10/18 0539  LABPROT 23.2*  INR 2.1*     Studies/Results: Dg Chest Port 1 View  Result Date: 06/10/2018 CLINICAL DATA:  Shortness of breath and rectal bleeding since last night. EXAM: PORTABLE CHEST 1 VIEW COMPARISON:  03/03/2015 FINDINGS: Lungs are adequately inflated with minimal stable elevation of the left hemidiaphragm. Lungs are otherwise clear. Stable mild cardiomegaly. Remainder of the exam is unchanged. IMPRESSION: No active disease. Mild stable cardiomegaly. Electronically Signed   By: Elberta Fortis M.D.   On: 06/10/2018 08:37    Impression/Plan: Lower GI bleed likely diverticular in origin on chronic Coumadin that has been reversed with Vit K and FFP. Hgb 13. Would manage conservatively and see if bleeding persists despite reversal of INR. I am NOT convinced a repeat colonoscopy is needed but if bleeding persists, then may need to re-evaluate for that. Clear liquid diet. Dr. Levora Angel will f/u tomorrow.    LOS: 0 days   Shirley Friar  06/10/2018, 1:34 PM  Questions please call [phone]

## 2018-06-10 NOTE — Progress Notes (Signed)
  Pt orientation to unit, room and routine. Information packet given to patient safety video watched.  Admission INP armband ID verified with patient/family, and in place. SR up x 2, fall risk assessment complete with Patient  verbalizing understanding of risks associated with falls. Pt verbalizes an understanding of how to use the call bell and to call for help before getting out of bed.  Skin, clean-dry- intact without evidence of bruising, or skin tears.   No evidence of skin break down noted on exam.   patient had one more episode of bright red blood with stool  no complaints of pain or discomfort when asked, pt GI doctor is aware .  Will cont to monitor and assist as needed. Emberlyn Burlison Sherlynn Carbon, RN 06/10/2018 1:42 PM

## 2018-06-10 NOTE — ED Provider Notes (Signed)
[facility] EMERGENCY DEPARTMENT Provider Note   CSN: [id number] Arrival date & time: 06/10/18  0509    History   Chief Complaint Chief Complaint  Patient presents with  . Rectal Bleeding    HPI Eric Gallagher is a 78 y.o. male with a past medical history of A. fib anticoagulated with warfarin, CAD, DM, stroke, thrombocytopenia, who presents today for evaluation of rectal bleeding.  He reports that at about midnight he started feeling like his insides were "churning."  He has had 4 episodes of passing large volume blood per rectum.  Initially it was dark however the most recent one was bright red.  He denies any nausea vomiting or diarrhea.  He reports his last colonoscopy was approximately 5 years ago.  He does not have any abdominal pain.  Denies any recent trauma.  He reports compliance with his medications, his INR was last checked within the past week and was reportedly in range.     HPI  Past Medical History:  Diagnosis Date  . Anginal pain (HCC)    h/o   . Arrhythmia    Chronic AFIB, no anti-coag patient desire and low platelet count  . Arthritis    both hips   . BPH (benign prostatic hyperplasia)   . Chronic a-fib   . Coronary artery disease    NSTEMI in 2005, Taxus DES LAD  . Diabetes mellitus without complication (HCC)    type II, some neuropathy  . Dysrhythmia    afib  . ED (erectile dysfunction)   . History of kidney stones 2012   passed spontaneously  . Hyperlipidemia   . Hypertension   . Old MI (myocardial infarction)   . Stroke (HCC) 2017  . Thrombocytopenia (HCC)    chronic    Patient Active Problem List   Diagnosis Date Noted  . Status post total replacement of left hip 03/01/2018  . Status post total replacement of right hip 01/04/2018  . Unilateral primary osteoarthritis, left hip 12/16/2017  . CVA (cerebral vascular accident) (HCC) 06/25/2016  . HTN (hypertension), benign 06/25/2016  . Dyspnea on exertion 05/29/2014  .  Coronary artery disease involving native coronary artery of native heart with angina pectoris (HCC) 05/29/2014  . Erectile dysfunction due to arterial insufficiency 05/29/2014  . Type 2 diabetes mellitus with other circulatory complications (HCC) 05/29/2014  . Chronic atrial fibrillation 05/29/2014    Past Surgical History:  Procedure Laterality Date  . APPENDECTOMY     as a teen  . CARDIAC CATHETERIZATION N/A 07/24/2014   Procedure: Right/Left Heart Cath and Coronary Angiography;  Surgeon: Lyn Records, MD;  Location: [facility] INVASIVE CV LAB;  Service: Cardiovascular;  Laterality: N/A;  . EYE SURGERY Left 2016   retinal surgery, cataracts removed- both eyes  . HERNIA REPAIR  1998   L inguinal   . I&D EXTREMITY Right 06/18/2014   Procedure: OPEN TREATMENT RIGHT THUMB, INCOMPLETE AMPUTATION.  Washing and dressing applied to left thumb.;  Surgeon: Mack Hook, MD;  Location: [facility] OR;  Service: Orthopedics;  Laterality: Right;  . TOTAL HIP ARTHROPLASTY Right 01/04/2018   Procedure: RIGHT TOTAL HIP ARTHROPLASTY ANTERIOR APPROACH;  Surgeon: Kathryne Hitch, MD;  Location: [facility] OR;  Service: Orthopedics;  Laterality: Right;  . TOTAL HIP ARTHROPLASTY Left 03/01/2018   Procedure: LEFT TOTAL HIP ARTHROPLASTY ANTERIOR APPROACH;  Surgeon: Kathryne Hitch, MD;  Location: [facility] OR;  Service: Orthopedics;  Laterality: Left;        Home Medications  Prior to Admission medications   Medication Sig Start Date End Date Taking? Authorizing Provider  ACCU-CHEK AVIVA PLUS test strip Use as directed weekly. 03/07/16   [provider]  ACCU-CHEK SOFTCLIX LANCETS lancets Use as directed weekly. 03/11/16   [provider]  alfuzosin (UROXATRAL) 10 MG 24 hr tablet Take 10 mg by mouth at bedtime.  04/15/16   [provider]  enoxaparin (LOVENOX) 100 MG/ML injection Inject 100 mg into the skin every 12 (twelve) hours. 02/25/18   [provider]  gabapentin (NEURONTIN) 300  MG capsule Take 1 capsule (300 mg total) by mouth 3 (three) times daily. 05/03/18   Kathryne HitchBlackman, Christopher Y, MD  glipiZIDE (GLUCOTROL) 5 MG tablet Take 2.5 mg by mouth at bedtime.    [provider]  metFORMIN (GLUCOPHAGE) 1000 MG tablet Take 1,000 mg by mouth 2 (two) times daily with a meal.    [provider]  methocarbamol (ROBAXIN) 500 MG tablet Take 1 tablet (500 mg total) by mouth every 6 (six) hours as needed for muscle spasms. 03/04/18   Kathryne HitchBlackman, Christopher Y, MD  metoprolol (LOPRESSOR) 100 MG tablet Take 100 mg by mouth 2 (two) times daily.    [provider]  naproxen (NAPROSYN) 500 MG tablet TAKE 1 TABLET BY MOUTH 2 TIMES DAILY BETWEEN MEALS AS NEEDED. 05/30/18   Kathryne HitchBlackman, Christopher Y, MD  nitroGLYCERIN (NITROSTAT) 0.4 MG SL tablet Place 0.4 mg under the tongue every 5 (five) minutes as needed for chest pain.    [provider]  oxyCODONE (OXY IR/ROXICODONE) 5 MG immediate release tablet Take 1-2 tablets (5-10 mg total) by mouth every 4 (four) hours as needed for moderate pain (pain score 4-6). 04/04/18   Kathryne HitchBlackman, Christopher Y, MD  simvastatin (ZOCOR) 40 MG tablet Take 40 mg by mouth every evening.    [provider]  warfarin (COUMADIN) 5 MG tablet Take 5 mg by mouth See admin instructions. Take 5 mg by mouth daily except for Saturdays. 04/27/16   [provider]    Family History Family History  Problem Relation Age of Onset  . CVA Mother   . Aneurysm Father   . Heart disease Brother   . Heart disease Brother   . Heart attack Maternal Aunt   . Heart disease Maternal Aunt     Social History Social History   Tobacco Use  . Smoking status: Former Smoker    Last attempt to quit: 12/29/1965    Years since quitting: 52.4  . Smokeless tobacco: Never Used  Substance Use Topics  . Alcohol use: No    Alcohol/week: 0.0 standard drinks  . Drug use: No     Allergies   Flomax [tamsulosin hcl]   Review of Systems Review  of Systems  Constitutional: Negative for chills and fever.  HENT: Negative for congestion.   Respiratory: Negative for chest tightness and shortness of breath.   Cardiovascular: Negative for chest pain.  Gastrointestinal: Positive for anal bleeding. Negative for abdominal pain, diarrhea, nausea and vomiting.  Genitourinary: Negative for decreased urine volume, flank pain and urgency.  Musculoskeletal: Negative for back pain and neck pain.  Psychiatric/Behavioral: Negative for confusion.  All other systems reviewed and are negative.    Physical Exam Updated Vital Signs BP 109/77   Pulse 61   Temp 98.4 F (36.9 C) (Oral)   Resp 18   Wt 102.6 kg   SpO2 96%   BMI 28.27 kg/m   Physical Exam Vitals signs and nursing note  reviewed. Exam conducted with a chaperone present.  Constitutional:      General: He is not in acute distress.    Appearance: He is well-developed. He is not diaphoretic.  HENT:     Head: Normocephalic and atraumatic.     Nose: Nose normal.     Mouth/Throat:     Mouth: Mucous membranes are moist.  Eyes:     General: No scleral icterus.       Right eye: No discharge.        Left eye: No discharge.     Conjunctiva/sclera: Conjunctivae normal.  Neck:     Musculoskeletal: Normal range of motion.  Cardiovascular:     Rate and Rhythm: Normal rate and regular rhythm.     Pulses: Normal pulses.     Heart sounds: Normal heart sounds.  Pulmonary:     Effort: Pulmonary effort is normal. No respiratory distress.     Breath sounds: Normal breath sounds. No stridor.  Abdominal:     General: There is no distension.     Palpations: Abdomen is soft.     Tenderness: There is no abdominal tenderness. There is no guarding.  Genitourinary:    Comments: Rectal exam with bright red blood.  No fissure or external hemorrhoids noted.  Musculoskeletal:        General: No deformity or signs of injury.  Skin:    General: Skin is warm and dry.  Neurological:     General: No  focal deficit present.     Mental Status: He is alert and oriented to person, place, and time.     Motor: No abnormal muscle tone.  Psychiatric:        Mood and Affect: Mood normal.        Behavior: Behavior normal.      ED Treatments / Results  Labs (all labs ordered are listed, but only abnormal results are displayed) Labs Reviewed  COMPREHENSIVE METABOLIC PANEL - Abnormal; Notable for the following components:      Result Value   Glucose, Bld 153 (*)    All other components within normal limits  CBC - Abnormal; Notable for the following components:   Platelets 148 (*)    All other components within normal limits  PROTIME-INR - Abnormal; Notable for the following components:   Prothrombin Time 23.2 (*)    INR 2.1 (*)    All other components within normal limits  POC OCCULT BLOOD, ED - Abnormal; Notable for the following components:   Fecal Occult Bld POSITIVE (*)    All other components within normal limits  CBC WITH DIFFERENTIAL/PLATELET  TYPE AND SCREEN  ABO/RH    EKG EKG Interpretation  Date/Time:  Friday Jun 10 2018 05:27:28 EDT Ventricular Rate:  78 PR Interval:    QRS Duration: 91 QT Interval:  394 QTC Calculation: 449 R Axis:   9 Text Interpretation:  Atrial fibrillation Abnormal R-wave progression, early transition Borderline ST depression, diffuse leads No significant change since last tracing Confirmed by Ward, Baxter Hire [phone]) on 06/10/2018 5:31:09 AM   Radiology No results found.  Procedures Procedures (including critical care time)  Medications Ordered in ED Medications - No data to display   Initial Impression / Assessment and Plan / ED Course  I have reviewed the triage vital signs and the nursing notes.  Pertinent labs & imaging results that were available during my care of the patient were reviewed by me and considered in my medical decision making (see chart for  details).  Clinical Course as of Jun 09 757  Fri Jun 10, 2018  0754 Spoke  with [facility] who requests that we reverse his anticoagulation with vitamin K.    [EH]    Clinical Course User Index [EH] Cristina Gong, PA-C      Patient presents today for evaluation of bright red blood per rectum.  This started at around midnight.  Since then he has had a total of 4 episodes of blood per rectum, starting as dark red progressing to bright red.  On rectal exam there is bright red blood.  He is anticoagulated with warfarin.  His INR today is 2.1.  He is hemodynamically stable.  He does not have any abdominal pain, therefore do not feel like CT scan abdomen is indicated.  He does not appear to have a life-threatening bleed and therefore will not be emergently reversed.  He is not anemic.  His platelets are borderline low at 152.    [facility] requested reversal of anticoagulation with vitamin K IV.    This patient was discussed with Dr. Patria Mane.     Final Clinical Impressions(s) / ED Diagnoses   Final diagnoses:  Acute lower GI bleeding    ED Discharge Orders    None       Norman Clay 06/10/18 0805    Azalia Bilis, MD 06/10/18 [phone]

## 2018-06-10 NOTE — Progress Notes (Signed)
Pt states he had two more episodes of bright red stool. Patient educated to let nurse know when he has a bowel movement. Report given at bed side with night shift nurse and are aware and will moniotr patient. Patient has no complaints of pain or discomfort at this time.

## 2018-06-10 NOTE — H&P (Addendum)
History and Physical    Eric Gallagher DBZ:[id number] DOB: [birth year] DOA: 06/10/2018  Referring MD/NP/PA: Azalia Bilis, MD PCP: Georgann Housekeeper, MD  Patient coming from: Home  Chief Complaint: Rectal bleeding  I have personally briefly reviewed patient's old medical records in [facility]   HPI: Eric Gallagher is a 78 y.o. male with medical history significant of HTN, HLD, CAD, CVA, A. fib on Coumadin, DM type II, BPH, arthritis, and thrombocytopenia; who presents with complaints of rectal bleeding starting sometime last night.  His 2 stools were noted to be dark in color, but thereafter had been more bright red.  Notes having bowel movements every hour since symptoms started.  Associated symptoms include shortness of breath with exertion.  Denies having any abdominal pain, dysuria, nausea, vomiting, diarrhea, fever, or chills.  He used to be on Advil, but his doctor switched him over to naproxen.  He reports his last colonoscopy was approximately 5 years ago.  Denies having any rectal bleeding like this previously in the past.  ED Course: Upon admission to the emergency department patient was seen to be afebrile, respirations 20-30, blood pressure 73/61-133/78, and O2 saturations maintained on room air.  Labs revealed hemoglobin 13.2, repeat hemoglobin check 13.1, platelet 148, BUN 14, creatinine 0.91, and all other labs relatively within normal limits.  Chest x-ray showed noted mild stable cardiomegaly without edema.  Patient was typed and screened,  given 5 mg of vitamin K, and FFP.  TRH called to admit.  Review of Systems  Constitutional: Negative for chills and fever.  HENT: Negative for ear discharge and nosebleeds.   Eyes: Negative for photophobia and pain.  Respiratory: Positive for cough and sputum production.   Cardiovascular: Negative for chest pain and leg swelling.  Gastrointestinal: Positive for blood in stool and diarrhea.  Genitourinary: Negative for dysuria and hematuria.   Musculoskeletal: Positive for joint pain.  Skin: Negative for itching and rash.  Neurological: Negative for focal weakness and loss of consciousness.  Endo/Heme/Allergies: Bruises/bleeds easily.  Psychiatric/Behavioral: Negative for substance abuse.    Past Medical History:  Diagnosis Date   Anginal pain (HCC)    h/o    Arrhythmia    Chronic AFIB, no anti-coag patient desire and low platelet count   Arthritis    both hips    BPH (benign prostatic hyperplasia)    Chronic a-fib    Coronary artery disease    NSTEMI in 2005, Taxus DES LAD   Diabetes mellitus without complication (HCC)    type II, some neuropathy   Dysrhythmia    afib   ED (erectile dysfunction)    History of kidney stones 2012   passed spontaneously   Hyperlipidemia    Hypertension    Old MI (myocardial infarction)    Stroke (HCC) 2017   Thrombocytopenia (HCC)    chronic    Past Surgical History:  Procedure Laterality Date   APPENDECTOMY     as a teen   CARDIAC CATHETERIZATION N/A 07/24/2014   Procedure: Right/Left Heart Cath and Coronary Angiography;  Surgeon: Lyn Records, MD;  Location: [facility];  Service: Cardiovascular;  Laterality: N/A;   EYE SURGERY Left 2016   retinal surgery, cataracts removed- both eyes   HERNIA REPAIR  1998   L inguinal    I&D EXTREMITY Right 06/18/2014   Procedure: OPEN TREATMENT RIGHT THUMB, INCOMPLETE AMPUTATION.  Washing and dressing applied to left thumb.;  Surgeon: Mack Hook, MD;  Location: [facility] OR;  Service: Orthopedics;  Laterality: Right;   TOTAL HIP ARTHROPLASTY Right 01/04/2018   Procedure: RIGHT TOTAL HIP ARTHROPLASTY ANTERIOR APPROACH;  Surgeon: Kathryne Hitch, MD;  Location: [facility] OR;  Service: Orthopedics;  Laterality: Right;   TOTAL HIP ARTHROPLASTY Left 03/01/2018   Procedure: LEFT TOTAL HIP ARTHROPLASTY ANTERIOR APPROACH;  Surgeon: Kathryne Hitch, MD;  Location: [facility] OR;  Service: Orthopedics;  Laterality: Left;      reports that he quit smoking about 52 years ago. He has never used smokeless tobacco. He reports that he does not drink alcohol or use drugs.  Allergies  Allergen Reactions   Flomax [Tamsulosin Hcl] Other (See Comments)    Dizzy    Family History  Problem Relation Age of Onset   CVA Mother    Aneurysm Father    Heart disease Brother    Heart disease Brother    Heart attack Maternal Aunt    Heart disease Maternal Aunt     Prior to Admission medications   Medication Sig Start Date End Date Taking? Authorizing Provider  alfuzosin (UROXATRAL) 10 MG 24 hr tablet Take 10 mg by mouth at bedtime.  04/15/16  Yes [provider]  amLODipine (NORVASC) 5 MG tablet Take 5 mg by mouth daily. 03/11/18  Yes [provider]  benazepril (LOTENSIN) 20 MG tablet Take 20 mg by mouth daily. 04/19/18  Yes [provider]  glipiZIDE (GLUCOTROL) 5 MG tablet Take 2.5 mg by mouth at bedtime.   Yes [provider]  metFORMIN (GLUCOPHAGE) 1000 MG tablet Take 1,000 mg by mouth 2 (two) times daily with a meal.   Yes [provider]  metoprolol (LOPRESSOR) 100 MG tablet Take 100 mg by mouth 2 (two) times daily.   Yes [provider]  naproxen (NAPROSYN) 500 MG tablet TAKE 1 TABLET BY MOUTH 2 TIMES DAILY BETWEEN MEALS AS NEEDED. Patient taking differently: Take 500 mg by mouth 2 (two) times daily with a meal.  05/30/18  Yes Kathryne Hitch, MD  nitroGLYCERIN (NITROSTAT) 0.4 MG SL tablet Place 0.4 mg under the tongue every 5 (five) minutes as needed for chest pain.   Yes [provider]  simvastatin (ZOCOR) 40 MG tablet Take 40 mg by mouth every evening.   Yes [provider]  warfarin (COUMADIN) 5 MG tablet Take 5 mg by mouth See admin instructions. Take 5 mg by mouth daily except for Saturdays. 04/27/16  Yes [provider]  ACCU-CHEK AVIVA PLUS test strip Use as directed weekly. 03/07/16   [provider]   ACCU-CHEK SOFTCLIX LANCETS lancets Use as directed weekly. 03/11/16   [provider]  gabapentin (NEURONTIN) 300 MG capsule Take 1 capsule (300 mg total) by mouth 3 (three) times daily. Patient not taking: Reported on 06/10/2018 05/03/18   Kathryne Hitch, MD  methocarbamol (ROBAXIN) 500 MG tablet Take 1 tablet (500 mg total) by mouth every 6 (six) hours as needed for muscle spasms. Patient not taking: Reported on 06/10/2018 03/04/18   Kathryne Hitch, MD  oxyCODONE (OXY IR/ROXICODONE) 5 MG immediate release tablet Take 1-2 tablets (5-10 mg total) by mouth every 4 (four) hours as needed for moderate pain (pain score 4-6). Patient not taking: Reported on 06/10/2018 04/04/18   Kathryne Hitch, MD    Physical Exam:  Constitutional: NAD, calm, comfortable Vitals:   06/10/18 0700 06/10/18 0715 06/10/18 0730 06/10/18 0745  BP: 109/77 133/78 110/79 112/72  Pulse: 61 70 73 77  Resp: 18 (!) 23 (!)  25 (!) 28  Temp:      TempSrc:      SpO2: 96% 97% 97% 96%  Weight:       Eyes: PERRL, lids and conjunctivae normal ENMT: Mucous membranes are moist. Posterior pharynx clear of any exudate or lesions.Normal dentition.  Neck: normal, supple, no masses, no thyromegaly Respiratory: clear to auscultation bilaterally, no wheezing, no crackles. Normal respiratory effort. No accessory muscle use.  Cardiovascular: Irregular irregular, no murmurs / rubs / gallops. Trace lower extremity edema. 2+ pedal pulses. No carotid bruits.  Abdomen: no tenderness, no masses palpated. No hepatosplenomegaly. Bowel sounds positive.  Musculoskeletal: no clubbing / cyanosis. No joint deformity upper and lower extremities. Good ROM, no contractures. Normal muscle tone.  Skin: no rashes, lesions, ulcers. No induration Neurologic: CN 2-12 grossly intact. Sensation intact, DTR normal. Strength 5/5 in all 4.  Psychiatric: Normal judgment and insight. Alert and oriented x 3. Normal mood.     Labs on  Admission: I have personally reviewed following labs and imaging studies  CBC: Recent Labs  Lab 06/10/18 0535  WBC 5.9   5.9  NEUTROABS 3.9  HGB 13.1   13.2  HCT 42.4   41.9  MCV 93.2   92.9  PLT 152   148*   Basic Metabolic Panel: Recent Labs  Lab 06/10/18 0535  NA 138  K 4.2  CL 103  CO2 27  GLUCOSE 153*  BUN 14  CREATININE 0.91  CALCIUM 9.4   GFR: Estimated Creatinine Clearance: 85.4 mL/min (by C-G formula based on SCr of 0.91 mg/dL). Liver Function Tests: Recent Labs  Lab 06/10/18 0535  AST 20  ALT 18  ALKPHOS 82  BILITOT 0.8  PROT 7.3  ALBUMIN 4.0   No results for input(s): LIPASE, AMYLASE in the last 168 hours. No results for input(s): AMMONIA in the last 168 hours. Coagulation Profile: Recent Labs  Lab 06/10/18 0539  INR 2.1*   Cardiac Enzymes: No results for input(s): CKTOTAL, CKMB, CKMBINDEX, TROPONINI in the last 168 hours. BNP (last 3 results) No results for input(s): PROBNP in the last 8760 hours. HbA1C: No results for input(s): HGBA1C in the last 72 hours. CBG: No results for input(s): GLUCAP in the last 168 hours. Lipid Profile: No results for input(s): CHOL, HDL, LDLCALC, TRIG, CHOLHDL, LDLDIRECT in the last 72 hours. Thyroid Function Tests: No results for input(s): TSH, T4TOTAL, FREET4, T3FREE, THYROIDAB in the last 72 hours. Anemia Panel: No results for input(s): VITAMINB12, FOLATE, FERRITIN, TIBC, IRON, RETICCTPCT in the last 72 hours. Urine analysis: No results found for: COLORURINE, APPEARANCEUR, LABSPEC, PHURINE, GLUCOSEU, HGBUR, BILIRUBINUR, KETONESUR, PROTEINUR, UROBILINOGEN, NITRITE, LEUKOCYTESUR Sepsis Labs: No results found for this or any previous visit (from the past 240 hour(s)).   Radiological Exams on Admission: Dg Chest Port 1 View  Result Date: 06/10/2018 CLINICAL DATA:  Shortness of breath and rectal bleeding since last night. EXAM: PORTABLE CHEST 1 VIEW COMPARISON:  03/03/2015 FINDINGS: Lungs are adequately  inflated with minimal stable elevation of the left hemidiaphragm. Lungs are otherwise clear. Stable mild cardiomegaly. Remainder of the exam is unchanged. IMPRESSION: No active disease. Mild stable cardiomegaly. Electronically Signed   By: Elberta Fortis M.D.   On: 06/10/2018 08:37    EKG: Independently reviewed. atrial fibrillation at 78 bpm.  Assessment/Plan Rectal bleeding: Acute.  Patient presents with several episodes of rectal bleeding.  Reportedly started out dark and had become more bright red in color.  Stool guaiacs noted to be positive.  Hemoglobin appears  currently stable at 13.1g/dL at 5: 35 A.M. Patient is on medications of naproxen. Suspect could have a upper GI bleed. -Admit to a telemetry bed -Serial monitoring of H&H  -Protonix IV twice daily -Transfuse blood products as needed -Held naproxen -[facility] consulted, will follow-up for further recommendations  Transient hypotension: Resolving.  Systolic blood pressures noted to be transiently as low as 70s, but improved with IV fluids. -Hold home medications of amlodipine, benazepril, and metoprolol for now  Atrial fibrillation on chronic anticoagulation: INR 2.1 on admission.  Patient was given 5 mg of vitamin K and transfused FFP. -Hold Coumadin   -Recheck INR at 12  Diabetes mellitus type 2: Last hemoglobin A1c 6.3 on 02/2018. -Hypoglycemic protocol -Hold glipizide and metformin -CBGs q. before meals with sensitive SSI  Thrombocytopenia: Chronic.  Platelet count mildly low at 144 on admission.    DVT prophylaxis: SCDs  Code Status: Full Family Communication: Stepson present at bedside Disposition Plan: Likely discharge home in 2 to 3 days once bleeding resolved and hemoglobin stable Consults called: Gastroenterology Admission status: Observation  Clydie Braunondell A Detron Carras MD [facility] Pager [phone]   If 7PM-7AM, please contact night-coverage [email or web address] Password [email or web address]  06/10/2018, 8:56 AM

## 2018-06-10 NOTE — ED Triage Notes (Signed)
Pt in from home with c/o rectal bleeding since last night. Reports 4 dark stools PTA, takes Coumadin. Denies any sob or weakness.

## 2018-06-10 NOTE — ED Notes (Signed)
Attempted report 

## 2018-06-11 DIAGNOSIS — I2510 Atherosclerotic heart disease of native coronary artery without angina pectoris: Secondary | ICD-10-CM | POA: Diagnosis present

## 2018-06-11 DIAGNOSIS — E785 Hyperlipidemia, unspecified: Secondary | ICD-10-CM | POA: Diagnosis present

## 2018-06-11 DIAGNOSIS — Z86010 Personal history of colonic polyps: Secondary | ICD-10-CM | POA: Diagnosis not present

## 2018-06-11 DIAGNOSIS — I959 Hypotension, unspecified: Secondary | ICD-10-CM | POA: Diagnosis present

## 2018-06-11 DIAGNOSIS — D696 Thrombocytopenia, unspecified: Secondary | ICD-10-CM | POA: Diagnosis present

## 2018-06-11 DIAGNOSIS — E1159 Type 2 diabetes mellitus with other circulatory complications: Secondary | ICD-10-CM | POA: Diagnosis not present

## 2018-06-11 DIAGNOSIS — I119 Hypertensive heart disease without heart failure: Secondary | ICD-10-CM | POA: Diagnosis present

## 2018-06-11 DIAGNOSIS — R0602 Shortness of breath: Secondary | ICD-10-CM

## 2018-06-11 DIAGNOSIS — I252 Old myocardial infarction: Secondary | ICD-10-CM | POA: Diagnosis not present

## 2018-06-11 DIAGNOSIS — K922 Gastrointestinal hemorrhage, unspecified: Secondary | ICD-10-CM | POA: Diagnosis present

## 2018-06-11 DIAGNOSIS — I482 Chronic atrial fibrillation, unspecified: Secondary | ICD-10-CM | POA: Diagnosis not present

## 2018-06-11 DIAGNOSIS — D62 Acute posthemorrhagic anemia: Secondary | ICD-10-CM | POA: Diagnosis not present

## 2018-06-11 DIAGNOSIS — Z79899 Other long term (current) drug therapy: Secondary | ICD-10-CM | POA: Diagnosis not present

## 2018-06-11 DIAGNOSIS — Z87891 Personal history of nicotine dependence: Secondary | ICD-10-CM | POA: Diagnosis not present

## 2018-06-11 DIAGNOSIS — E1140 Type 2 diabetes mellitus with diabetic neuropathy, unspecified: Secondary | ICD-10-CM | POA: Diagnosis present

## 2018-06-11 DIAGNOSIS — Z888 Allergy status to other drugs, medicaments and biological substances status: Secondary | ICD-10-CM | POA: Diagnosis not present

## 2018-06-11 DIAGNOSIS — Z7901 Long term (current) use of anticoagulants: Secondary | ICD-10-CM | POA: Diagnosis not present

## 2018-06-11 DIAGNOSIS — Z7984 Long term (current) use of oral hypoglycemic drugs: Secondary | ICD-10-CM | POA: Diagnosis not present

## 2018-06-11 DIAGNOSIS — K5731 Diverticulosis of large intestine without perforation or abscess with bleeding: Secondary | ICD-10-CM | POA: Diagnosis present

## 2018-06-11 DIAGNOSIS — N400 Enlarged prostate without lower urinary tract symptoms: Secondary | ICD-10-CM | POA: Diagnosis present

## 2018-06-11 DIAGNOSIS — Z955 Presence of coronary angioplasty implant and graft: Secondary | ICD-10-CM | POA: Diagnosis not present

## 2018-06-11 DIAGNOSIS — Z8673 Personal history of transient ischemic attack (TIA), and cerebral infarction without residual deficits: Secondary | ICD-10-CM | POA: Diagnosis not present

## 2018-06-11 LAB — BASIC METABOLIC PANEL
Anion gap: 9 (ref 5–15)
BUN: 11 mg/dL (ref 8–23)
CO2: 22 mmol/L (ref 22–32)
Calcium: 8.5 mg/dL — ABNORMAL LOW (ref 8.9–10.3)
Chloride: 109 mmol/L (ref 98–111)
Creatinine, Ser: 0.82 mg/dL (ref 0.61–1.24)
GFR calc Af Amer: >60 mL/min (ref >60)
GFR calc non Af Amer: >60 mL/min (ref >60)
Glucose, Bld: 109 mg/dL — ABNORMAL HIGH (ref 70–99)
Potassium: 3.7 mmol/L (ref 3.5–5.1)
Sodium: 140 mmol/L (ref 135–145)

## 2018-06-11 LAB — CBC
HCT: 29.7 % — ABNORMAL LOW (ref 39.0–52.0)
Hemoglobin: 9.6 g/dL — ABNORMAL LOW (ref 13.0–17.0)
MCH: 29 pg (ref 26.0–34.0)
MCHC: 32.3 g/dL (ref 30.0–36.0)
MCV: 89.7 fL (ref 80.0–100.0)
Platelets: 109 10*3/uL — ABNORMAL LOW (ref 150–400)
RBC: 3.31 MIL/uL — ABNORMAL LOW (ref 4.22–5.81)
RDW: 15.2 % (ref 11.5–15.5)
WBC: 5 10*3/uL (ref 4.0–10.5)
nRBC: 0 % (ref 0.0–0.2)

## 2018-06-11 MED ORDER — METOPROLOL TARTRATE 50 MG PO TABS
50.0000 mg | ORAL_TABLET | Freq: Two times a day (BID) | ORAL | Status: DC
  Administered 2018-06-11 – 2018-06-12 (×3): 50 mg via ORAL
  Filled 2018-06-11 (×3): qty 1

## 2018-06-11 MED ORDER — PNEUMOCOCCAL VAC POLYVALENT 25 MCG/0.5ML IJ INJ
0.5000 mL | INJECTION | INTRAMUSCULAR | Status: DC
  Filled 2018-06-11: qty 0.5

## 2018-06-11 NOTE — Progress Notes (Signed)
PROGRESS NOTE  Eric Gallagher ZOX:[id number] DOB: 07/17/1940 DOA: 06/10/2018 PCP: Georgann HousekeeperHusain, Karrar, MD   LOS: 0 days   Brief Narrative / Interim history: 78 year old male with history of hypertension, hyperlipidemia, CAD, prior CVA, A. fib on chronic Coumadin, type 2 diabetes mellitus, chronic thrombocytopenia who came to the hospital with rectal bleeding that started the day prior.  He noticed 2 stools with bright red blood and large amounts, that got him concerned and decided to come to the ED.  He had 2 episodes at home, 2 episodes in the ED  Subjective: He is feeling well this morning, denies any lightheadedness or dizziness.  No chest pain, no shortness of breath.  He has had a bowel movement this morning but no fresh blood but only blood clots.  Assessment & Plan: Principal Problem:   GI bleed Active Problems:   Type 2 diabetes mellitus with other circulatory complications (HCC)   Chronic atrial fibrillation   Transient hypotension   Chronic anticoagulation   Principal Problem Rectal blood per rectum -Sudden onset, it appears to have slowed down and almost resolved now.  He does not have any more fresh blood.  GI consulted and now recommending conservative management. -If he has recurrent bleed will need a tagged RBC scan -Serial monitoring H&H, did drop to 9.6 but not requiring transfusions  Hypotension -On admission, likely due to bleed, his blood pressure is now normalized  Chronic A. fib on anticoagulation -INR 2.1 on admission, he was given 5 mg of vitamin K and transfused FFP.  Hold Coumadin, appreciate GI recommendations regarding anticoagulation moving forward.  Diabetes mellitus type 2 -Hold glipizide and metformin, continue sliding scale  Hyperlipidemia -Continue statin  Chronic thrombocytopenia -Platelets 148 on admission, 109 this morning.  Closely monitor.     Scheduled Meds: . pantoprazole (PROTONIX) IV  40 mg Intravenous Q12H  . simvastatin  40 mg Oral  QPM  . sodium chloride flush  3 mL Intravenous Q12H   Continuous Infusions: . sodium chloride 50 mL/hr at 06/11/18 0554   PRN Meds:.acetaminophen **OR** acetaminophen, albuterol, ondansetron **OR** ondansetron (ZOFRAN) IV  DVT prophylaxis: SCDs Code Status: Full code Family Communication: no family at bedside  Disposition Plan: home when ready   Consultants:   GI  Procedures:   None   Antimicrobials:  None    Objective: Vitals:   06/10/18 1036 06/10/18 1319 06/10/18 2129 06/11/18 0533  BP: 128/73 117/82 113/73 126/87  Pulse: 79 88 76 75  Resp: 20 18 17 17   Temp: 98 F (36.7 C) (!) 97.4 F (36.3 C) (!) 97.4 F (36.3 C) 98 F (36.7 C)  TempSrc: Oral Oral Oral Oral  SpO2: 100% 99% 98% 98%  Weight: 99.2 kg     Height: 6\' 2"  (1.88 m)       Intake/Output Summary (Last 24 hours) at 06/11/2018 1126 Last data filed at 06/11/2018 [id number] Gross per 24 hour  Intake 984.58 ml  Output 200 ml  Net 784.58 ml   Filed Weights   06/10/18 0531 06/10/18 1036  Weight: 102.6 kg 99.2 kg    Examination:  Constitutional: NAD Eyes: PERRL, lids and conjunctivae normal ENMT: Mucous membranes are moist.  Respiratory: clear to auscultation bilaterally, no wheezing, no crackles. Normal respiratory effort. Cardiovascular: Regular rate and rhythm, no murmurs / rubs / gallops. No LE edema.  Abdomen: no tenderness. Bowel sounds positive.  Musculoskeletal: no clubbing / cyanosis. Skin: no rashes Neurologic: CN 2-12 grossly intact. Strength 5/5 in all 4.  Psychiatric:  Normal judgment and insight. Alert and oriented x 3. Normal mood.    Data Reviewed: I have independently reviewed following labs and imaging studies   CBC: Recent Labs  Lab 06/10/18 0535 06/10/18 1303 06/11/18 0238  WBC 5.9  5.9  --  5.0  NEUTROABS 3.9  --   --   HGB 13.1  13.2 11.0* 9.6*  HCT 42.4  41.9 34.2* 29.7*  MCV 93.2  92.9  --  89.7  PLT 152  148*  --  109*   Basic Metabolic Panel: Recent Labs  Lab  06/10/18 0535 06/11/18 0238  NA 138 140  K 4.2 3.7  CL 103 109  CO2 27 22  GLUCOSE 153* 109*  BUN 14 11  CREATININE 0.91 0.82  CALCIUM 9.4 8.5*   GFR: Estimated Creatinine Clearance: 92 mL/min (by C-G formula based on SCr of 0.82 mg/dL). Liver Function Tests: Recent Labs  Lab 06/10/18 0535  AST 20  ALT 18  ALKPHOS 82  BILITOT 0.8  PROT 7.3  ALBUMIN 4.0   No results for input(s): LIPASE, AMYLASE in the last 168 hours. No results for input(s): AMMONIA in the last 168 hours. Coagulation Profile: Recent Labs  Lab 06/10/18 0539 06/10/18 1303  INR 2.1* 1.7*   Cardiac Enzymes: No results for input(s): CKTOTAL, CKMB, CKMBINDEX, TROPONINI in the last 168 hours. BNP (last 3 results) No results for input(s): PROBNP in the last 8760 hours. HbA1C: No results for input(s): HGBA1C in the last 72 hours. CBG: No results for input(s): GLUCAP in the last 168 hours. Lipid Profile: No results for input(s): CHOL, HDL, LDLCALC, TRIG, CHOLHDL, LDLDIRECT in the last 72 hours. Thyroid Function Tests: No results for input(s): TSH, T4TOTAL, FREET4, T3FREE, THYROIDAB in the last 72 hours. Anemia Panel: No results for input(s): VITAMINB12, FOLATE, FERRITIN, TIBC, IRON, RETICCTPCT in the last 72 hours. Urine analysis: No results found for: COLORURINE, APPEARANCEUR, LABSPEC, PHURINE, GLUCOSEU, HGBUR, BILIRUBINUR, KETONESUR, PROTEINUR, UROBILINOGEN, NITRITE, LEUKOCYTESUR Sepsis Labs: Invalid input(s): PROCALCITONIN, LACTICIDVEN  No results found for this or any previous visit (from the past 240 hour(s)).    Radiology Studies: Dg Chest Port 1 View  Result Date: 06/10/2018 CLINICAL DATA:  Shortness of breath and rectal bleeding since last night. EXAM: PORTABLE CHEST 1 VIEW COMPARISON:  03/03/2015 FINDINGS: Lungs are adequately inflated with minimal stable elevation of the left hemidiaphragm. Lungs are otherwise clear. Stable mild cardiomegaly. Remainder of the exam is unchanged. IMPRESSION: No  active disease. Mild stable cardiomegaly. Electronically Signed   By: Elberta Fortis M.D.   On: 06/10/2018 08:37    Pamella Pert, MD, PhD [facility]  Contact via  [email or web address]  [facility] Office Info P: [phone]  F: [phone]

## 2018-06-11 NOTE — Progress Notes (Signed)
[facility] Gastroenterology Progress Note  Eric Gallagher 78 y.o. Mar 05, 1940  CC: GI bleed   Subjective: Patient is feeling better now.  Last bowel movement showed blood clots without any fresh red blood in it.  Denies abdominal pain, nausea vomiting.  ROS : Negative for fever.  Negative for chest pain.   Objective: Vital signs in last 24 hours: Vitals:   06/10/18 2129 06/11/18 0533  BP: 113/73 126/87  Pulse: 76 75  Resp: 17 17  Temp: (!) 97.4 F (36.3 C) 98 F (36.7 C)  SpO2: 98% 98%    Physical Exam:  General:  Alert, cooperative, no distress, appears stated age  Head:  Normocephalic, without obvious abnormality, atraumatic  Eyes:  , EOM's intact,   Lungs:   Clear to auscultation bilaterally, respirations unlabored  Heart:  Regular rate and rhythm, S1, S2 normal  Abdomen:   Soft, non-tender, nondistended, bowel sounds present.  Extremities: no  edema       Lab Results: Recent Labs    06/10/18 0535 06/11/18 0238  NA 138 140  K 4.2 3.7  CL 103 109  CO2 27 22  GLUCOSE 153* 109*  BUN 14 11  CREATININE 0.91 0.82  CALCIUM 9.4 8.5*   Recent Labs    06/10/18 0535  AST 20  ALT 18  ALKPHOS 82  BILITOT 0.8  PROT 7.3  ALBUMIN 4.0   Recent Labs    06/10/18 0535 06/10/18 1303 06/11/18 0238  WBC 5.9  5.9  --  5.0  NEUTROABS 3.9  --   --   HGB 13.1  13.2 11.0* 9.6*  HCT 42.4  41.9 34.2* 29.7*  MCV 93.2  92.9  --  89.7  PLT 152  148*  --  109*   Recent Labs    06/10/18 0539 06/10/18 1303  LABPROT 23.2* 19.4*  INR 2.1* 1.7*      Assessment/Plan: -Painless hematochezia.  Most likely diverticular bleed.  Drop in hemoglobin noted. -Acute blood loss anemia. -A. fib.  Coumadin on hold.  Recommendations ------------------------- -Likely his bleeding has slowed down. -Advance diet to full liquid. -Discussed with the nursing staff.  If further episodes of active bleeding, recommend bleeding scan for further evaluation. -Recheck CBC and INR in the  morning. -GI will follow   Kathi Der MD, FACP 06/11/2018, 9:07 AM  Contact #  [phone]

## 2018-06-11 NOTE — Care Management Obs Status (Signed)
MEDICARE OBSERVATION STATUS NOTIFICATION   Patient Details  Name: Eric Gallagher MRN: [id number] Date of Birth: [birth year]   Medicare Observation Status Notification Given:  Yes    Lawerance Sabal, RN 06/11/2018, 10:11 AM

## 2018-06-12 LAB — BPAM FFP
Blood Product Expiration Date: 202005012359
Blood Product Expiration Date: 202005032359
ISSUE DATE / TIME: 202004302340
ISSUE DATE / TIME: 202005010845
Unit Type and Rh: 6200
Unit Type and Rh: 6200

## 2018-06-12 LAB — CBC
HCT: 29.9 % — ABNORMAL LOW (ref 39.0–52.0)
Hemoglobin: 9.6 g/dL — ABNORMAL LOW (ref 13.0–17.0)
MCH: 29 pg (ref 26.0–34.0)
MCHC: 32.1 g/dL (ref 30.0–36.0)
MCV: 90.3 fL (ref 80.0–100.0)
Platelets: 126 10*3/uL — ABNORMAL LOW (ref 150–400)
RBC: 3.31 MIL/uL — ABNORMAL LOW (ref 4.22–5.81)
RDW: 15.2 % (ref 11.5–15.5)
WBC: 5.2 10*3/uL (ref 4.0–10.5)
nRBC: 0 % (ref 0.0–0.2)

## 2018-06-12 LAB — PROTIME-INR
INR: 1.3 — ABNORMAL HIGH (ref 0.8–1.2)
Prothrombin Time: 15.9 seconds — ABNORMAL HIGH (ref 11.4–15.2)

## 2018-06-12 LAB — PREPARE FRESH FROZEN PLASMA

## 2018-06-12 LAB — HEMOGLOBIN AND HEMATOCRIT, BLOOD
HCT: 29.5 % — ABNORMAL LOW (ref 39.0–52.0)
Hemoglobin: 9.6 g/dL — ABNORMAL LOW (ref 13.0–17.0)

## 2018-06-12 MED ORDER — WARFARIN SODIUM 5 MG PO TABS: 5.0000 mg | ORAL_TABLET | ORAL | 1 refills | Status: AC

## 2018-06-12 MED ORDER — METOPROLOL TARTRATE 50 MG PO TABS: 50.0000 mg | ORAL_TABLET | Freq: Two times a day (BID) | ORAL | 2 refills | Status: AC

## 2018-06-12 NOTE — Progress Notes (Signed)
Patient instructed on discharge paperwork. No concerns reported. Taken down by wheelchair to patient pickup.

## 2018-06-12 NOTE — Progress Notes (Signed)
Patient has had several pauses per tele. Longest pause 2.62 seconds. Patient sleeping. Sleep apnea noted. Patient easy to arouse. Bodenheimer, NP notified. Will continue to monitor.

## 2018-06-12 NOTE — Progress Notes (Signed)
[facility] Gastroenterology Progress Note  DESHUN URLACHER 78 y.o. 26-Feb-1940  CC: GI bleed   Subjective: No further bleeding episodes since yesterday morning.  Denies abdominal pain, nausea and vomiting.  Feeling hungry and wants to advance diet.  ROS : Negative for fever.  Negative for chest pain.   Objective: Vital signs in last 24 hours: Vitals:   06/11/18 2126 06/12/18 0536  BP: 119/80 140/88  Pulse: 77 68  Resp: 18 18  Temp: 98 F (36.7 C) 98.2 F (36.8 C)  SpO2: 98% 100%    Physical Exam:  General:  Alert, cooperative, no distress, appears stated age  Head:  Normocephalic, without obvious abnormality, atraumatic  Eyes:  , EOM's intact,   Lungs:   Clear to auscultation bilaterally, respirations unlabored  Heart:  Regular rate and rhythm, S1, S2 normal  Abdomen:   Soft, non-tender, nondistended, bowel sounds present.  Extremities: no  edema       Lab Results: Recent Labs    06/10/18 0535 06/11/18 0238  NA 138 140  K 4.2 3.7  CL 103 109  CO2 27 22  GLUCOSE 153* 109*  BUN 14 11  CREATININE 0.91 0.82  CALCIUM 9.4 8.5*   Recent Labs    06/10/18 0535  AST 20  ALT 18  ALKPHOS 82  BILITOT 0.8  PROT 7.3  ALBUMIN 4.0   Recent Labs    06/10/18 0535  06/11/18 0238 06/12/18 0208  WBC 5.9  5.9  --  5.0 5.2  NEUTROABS 3.9  --   --   --   HGB 13.1  13.2   < > 9.6* 9.6*  HCT 42.4  41.9   < > 29.7* 29.9*  MCV 93.2  92.9  --  89.7 90.3  PLT 152  148*  --  109* 126*   < > = values in this interval not displayed.   Recent Labs    06/10/18 1303 06/12/18 0208  LABPROT 19.4* 15.9*  INR 1.7* 1.3*      Assessment/Plan: -Painless hematochezia.  Most likely diverticular bleed.   -Acute blood loss anemia. -A. fib.  Coumadin on hold.  Recommendations ------------------------- -No further bleeding episodes.  Hemoglobin stable. -No inpatient GI work-up indicated at this time. -Advance diet. -Restart Coumadin in 2 days if no further bleeding episodes  while at home. -Okay to discharge from GI standpoint if repeat H&H this afternoon stable. -GI will sign off.  Follow-up in GI clinic in 4 weeks.   Kathi Der MD, FACP 06/12/2018, 8:31 AM  Contact #  [phone]

## 2018-06-12 NOTE — Plan of Care (Signed)
  Problem: Education: Goal: Knowledge of General Education information will improve Description Including pain rating scale, medication(s)/side effects and non-pharmacologic comfort measures Outcome: Adequate for Discharge   Problem: Health Behavior/Discharge Planning: Goal: Ability to manage health-related needs will improve Outcome: Adequate for Discharge   Problem: Clinical Measurements: Goal: Ability to maintain clinical measurements within normal limits will improve Outcome: Adequate for Discharge Goal: Will remain free from infection Outcome: Adequate for Discharge Goal: Diagnostic test results will improve Outcome: Adequate for Discharge Goal: Respiratory complications will improve Outcome: Adequate for Discharge Goal: Cardiovascular complication will be avoided Outcome: Adequate for Discharge   Problem: Activity: Goal: Risk for activity intolerance will decrease Outcome: Adequate for Discharge   Problem: Nutrition: Goal: Adequate nutrition will be maintained Outcome: Adequate for Discharge   Problem: Coping: Goal: Level of anxiety will decrease Outcome: Adequate for Discharge   Problem: Elimination: Goal: Will not experience complications related to bowel motility Outcome: Adequate for Discharge Goal: Will not experience complications related to urinary retention Outcome: Adequate for Discharge   Problem: Pain Managment: Goal: General experience of comfort will improve Outcome: Adequate for Discharge   Problem: Safety: Goal: Ability to remain free from injury will improve Outcome: Adequate for Discharge   Problem: Education: Goal: Ability to identify signs and symptoms of gastrointestinal bleeding will improve Outcome: Adequate for Discharge   Problem: Bowel/Gastric: Goal: Will show no signs and symptoms of gastrointestinal bleeding Outcome: Adequate for Discharge   Problem: Fluid Volume: Goal: Will show no signs and symptoms of excessive  bleeding Outcome: Adequate for Discharge   Problem: Clinical Measurements: Goal: Complications related to the disease process, condition or treatment will be avoided or minimized Outcome: Adequate for Discharge

## 2018-06-12 NOTE — Discharge Instructions (Signed)
1)Take Coumadin 5 mg by mouth daily except for Saturdays ---Restart Coumadin on Wednesday 06/15/2018 2) you are taking Coumadin/warfarin which is a blood thinner--- Avoid ibuprofen/Advil/Aleve/Motrin/Goody Powders/Naproxen/BC powders/Meloxicam/Diclofenac/Indomethacin and other Nonsteroidal anti-inflammatory medications as these will make you more likely to bleed and can cause stomach ulcers, can also cause Kidney problems.  3)Recheck CBC/complete blood count and BMP/kidney and electrolyte test with a primary care physician within a week

## 2018-06-12 NOTE — Discharge Summary (Signed)
Eric Gallagher, is a 78 y.o. male  DOB 04/19/1940  MRN [id number].  Admission date:  06/10/2018  Admitting Physician  Clydie Braun, MD  Discharge Date:  06/12/2018   Primary MD  Georgann Housekeeper, MD  Recommendations for primary care physician for things to follow:   1)Take Coumadin 5 mg by mouth daily except for Saturdays ---Restart Coumadin on Wednesday 06/15/2018 2) you are taking Coumadin/warfarin which is a blood thinner--- Avoid ibuprofen/Advil/Aleve/Motrin/Goody Powders/Naproxen/BC powders/Meloxicam/Diclofenac/Indomethacin and other Nonsteroidal anti-inflammatory medications as these will make you more likely to bleed and can cause stomach ulcers, can also cause Kidney problems.  3)Recheck CBC/complete blood count and BMP/kidney and electrolyte test with a primary care physician within a week   Admission Diagnosis  Acute lower GI bleeding [K92.2] SOB (shortness of breath) [R06.02]   Discharge Diagnosis  Acute lower GI bleeding [K92.2] SOB (shortness of breath) [R06.02]   Principal Problem:   GI bleed Active Problems:   Type 2 diabetes mellitus with other circulatory complications (HCC)   Chronic atrial fibrillation   Transient hypotension   Chronic anticoagulation      Past Medical History:  Diagnosis Date  . Anginal pain (HCC)    h/o   . Arrhythmia    Chronic AFIB, no anti-coag patient desire and low platelet count  . Arthritis    both hips   . BPH (benign prostatic hyperplasia)   . Chronic a-fib   . Coronary artery disease    NSTEMI in 2005, Taxus DES LAD  . Diabetes mellitus without complication (HCC)    type II, some neuropathy  . Dysrhythmia    afib  . ED (erectile dysfunction)   . History of kidney stones 2012   passed spontaneously  . Hyperlipidemia   . Hypertension   . Old MI (myocardial infarction)   . Stroke (HCC) 2017  . Thrombocytopenia (HCC)    chronic    Past  Surgical History:  Procedure Laterality Date  . APPENDECTOMY     as a teen  . CARDIAC CATHETERIZATION N/A 07/24/2014   Procedure: Right/Left Heart Cath and Coronary Angiography;  Surgeon: Lyn Records, MD;  Location: [facility] INVASIVE CV LAB;  Service: Cardiovascular;  Laterality: N/A;  . EYE SURGERY Left 2016   retinal surgery, cataracts removed- both eyes  . HERNIA REPAIR  1998   L inguinal   . I&D EXTREMITY Right 06/18/2014   Procedure: OPEN TREATMENT RIGHT THUMB, INCOMPLETE AMPUTATION.  Washing and dressing applied to left thumb.;  Surgeon: Mack Hook, MD;  Location[facility] OR;  Service: Orthopedics;  Laterality: Right;  . TOTAL HIP ARTHROPLASTY Right 01/04/2018   Procedure: RIGHT TOTAL HIP ARTHROPLASTY ANTERIOR APPROACH;  Surgeon: Kathryne Hitch, MD;  Location: [facility] OR;  Service: Orthopedics;  Laterality: Right;  . TOTAL HIP ARTHROPLASTY Left 03/01/2018   Procedure: LEFT TOTAL HIP ARTHROPLASTY ANTERIOR APPROACH;  Surgeon: Kathryne Hitch, MD;  Location: [facility] OR;  Service: Orthopedics;  Laterality: Left;     HPI  from the history and physical done  on the day of admission:    HPI: Eric Gallagher is a 78 y.o. male with medical history significant of HTN, HLD, CAD, CVA, A. fib on Coumadin, DM type II, BPH, arthritis, and thrombocytopenia; who presents with complaints of rectal bleeding starting sometime last night.  His 2 stools were noted to be dark in color, but thereafter had been more bright red.  Notes having bowel movements every hour since symptoms started.  Associated symptoms include shortness of breath with exertion.  Denies having any abdominal pain, dysuria, nausea, vomiting, diarrhea, fever, or chills.  He used to be on Advil, but his doctor switched him over to naproxen.  He reports his last colonoscopy was approximately 5 years ago.  Denies having any rectal bleeding like this previously in the past.  ED Course: Upon admission to the emergency department patient was seen to  be afebrile, respirations 20-30, blood pressure 73/61-133/78, and O2 saturations maintained on room air.  Labs revealed hemoglobin 13.2, repeat hemoglobin check 13.1, platelet 148, BUN 14, creatinine 0.91, and all other labs relatively within normal limits.  Chest x-ray showed noted mild stable cardiomegaly without edema.  Patient was typed and screened,  given 5 mg of vitamin K, and FFP.  [facility] called to admit.    Hospital Course:    Brief Summary:- 78 year old male with history of hypertension, hyperlipidemia, CAD, prior CVA, A. fib on chronic Coumadin, type 2 diabetes mellitus, chronic thrombocytopenia admitted on 06/10/2018 with rectal bleeding that started the day prior.     A/p 1)Rectal blood per rectum---patient had painless hematochezia without melena or hematemesis--- -no further rectal bleeding, patient had BM without blood, GI consulted and now recommending conservative management.  Serial H&H stable above 9, GI specialist recommends discharge home with outpatient follow-up, no endoluminal evaluation planned this visit  2)Chronic A. fib on anticoagulation -INR 2.1 on admission, he was given 5 mg of vitamin K and transfused FFP.    GI physician recommends restarting Coumadin on Wednesday, 06/15/2018... Avoid NSAIDs  3)Diabetes mellitus type 2 -A1c 6.3 , stable okay to resume glipizide and metformin,    4)Hyperlipidemia -Stable, continue statin  Chronic thrombocytopenia -Platelets is up to 126 from 109--- repeat CBC within a week  Discharge Condition: stable  Follow UP  Follow-up Information    Georgann Housekeeper, MD.   Specialty:  Internal Medicine Contact information: [address] [place] [address] [phone]        Gastroenterology, [place] Sprang. Schedule an appointment as soon as possible for a visit in 4 week(s).   Why:  Follow-up for GI bleed Contact information: [address] [place] [address] [phone]           Consults  obtained - Gi  Diet and Activity recommendation:  As advised  Discharge Instructions    Discharge Instructions    Call MD for:  difficulty breathing, headache or visual disturbances   Complete by:  As directed    Call MD for:  persistant dizziness or light-headedness   Complete by:  As directed    Call MD for:  persistant nausea and vomiting   Complete by:  As directed    Call MD for:  temperature >100.4   Complete by:  As directed    Diet - low sodium heart healthy   Complete by:  As directed    Discharge instructions   Complete by:  As directed    1)Take Coumadin 5 mg by mouth daily except  for Saturdays ---Restart Coumadin on Wednesday 06/15/2018 2) you are taking Coumadin/warfarin which is a blood thinner--- Avoid ibuprofen/Advil/Aleve/Motrin/Goody Powders/Naproxen/BC powders/Meloxicam/Diclofenac/Indomethacin and other Nonsteroidal anti-inflammatory medications as these will make you more likely to bleed and can cause stomach ulcers, can also cause Kidney problems.  3)Recheck CBC/complete blood count and BMP/kidney and electrolyte test with a primary care physician within a week   Increase activity slowly   Complete by:  As directed        Discharge Medications     Allergies as of 06/12/2018      Reactions   Flomax [tamsulosin Hcl] Other (See Comments)   Dizzy      Medication List    STOP taking these medications   naproxen 500 MG tablet Commonly known as:  NAPROSYN     TAKE these medications   Accu-Chek Aviva Plus test strip Generic drug:  glucose blood Use as directed weekly.   Accu-Chek Softclix Lancets lancets Use as directed weekly.   alfuzosin 10 MG 24 hr tablet Commonly known as:  UROXATRAL Take 10 mg by mouth at bedtime.   amLODipine 5 MG tablet Commonly known as:  NORVASC Take 5 mg by mouth daily.   benazepril 20 MG tablet Commonly known as:  LOTENSIN Take 20 mg by mouth daily.   gabapentin 300 MG capsule Commonly known as:  Neurontin Take 1  capsule (300 mg total) by mouth 3 (three) times daily.   glipiZIDE 5 MG tablet Commonly known as:  GLUCOTROL Take 2.5 mg by mouth at bedtime.   metFORMIN 1000 MG tablet Commonly known as:  GLUCOPHAGE Take 1,000 mg by mouth 2 (two) times daily with a meal.   methocarbamol 500 MG tablet Commonly known as:  ROBAXIN Take 1 tablet (500 mg total) by mouth every 6 (six) hours as needed for muscle spasms.   metoprolol tartrate 50 MG tablet Commonly known as:  LOPRESSOR Take 1 tablet (50 mg total) by mouth 2 (two) times daily. What changed:    medication strength  how much to take   nitroGLYCERIN 0.4 MG SL tablet Commonly known as:  NITROSTAT Place 0.4 mg under the tongue every 5 (five) minutes as needed for chest pain.   oxyCODONE 5 MG immediate release tablet Commonly known as:  Oxy IR/ROXICODONE Take 1-2 tablets (5-10 mg total) by mouth every 4 (four) hours as needed for moderate pain (pain score 4-6).   simvastatin 40 MG tablet Commonly known as:  ZOCOR Take 40 mg by mouth every evening.   warfarin 5 MG tablet Commonly known as:  COUMADIN Take 1 tablet (5 mg total) by mouth See admin instructions. Take 5 mg by mouth daily except for Saturdays ---Restart Coumadin on Wednesday 06/15/2018 What changed:  additional instructions       Major procedures and Radiology Reports - PLEASE review detailed and final reports for all details, in brief -   Dg Chest Port 1 View  Result Date: 06/10/2018 CLINICAL DATA:  Shortness of breath and rectal bleeding since last night. EXAM: PORTABLE CHEST 1 VIEW COMPARISON:  03/03/2015 FINDINGS: Lungs are adequately inflated with minimal stable elevation of the left hemidiaphragm. Lungs are otherwise clear. Stable mild cardiomegaly. Remainder of the exam is unchanged. IMPRESSION: No active disease. Mild stable cardiomegaly. Electronically Signed   By: Elberta Fortis M.D.   On: 06/10/2018 08:37   Xr Pelvis 1-2 Views  Result Date: 06/06/2018 An AP pelvis  and lateral of the left hip shows bilateral total hip arthroplasties with no complicating  features.   Micro Results   No results found for this or any previous visit (from the past 240 hour(s)).  Today   Subjective    Eric Gallagher today has no New concerns, had brown stool without blood... No abdominal pain, no fevers no dizziness no palpitations no dyspnea on exertion no chest pain  Tolerating oral intake well.... No emesis          Patient has been seen and examined prior to discharge   Objective   Blood pressure 140/88, pulse 68, temperature 98.2 F (36.8 C), temperature source Oral, resp. rate 18, height 6\' 2"  (1.88 m), weight 99.2 kg, SpO2 100 %.   Intake/Output Summary (Last 24 hours) at 06/12/2018 1343 Last data filed at 06/12/2018 1142 Gross per 24 hour  Intake 1210 ml  Output 1500 ml  Net -290 ml    Exam Gen:- Awake Alert, no acute distress  HEENT:- [place].AT, No sclera icterus Neck-Supple Neck,No JVD,.  Lungs-  CTAB , good air movement bilaterally  CV- S1, S2 normal, regular Abd-  +ve B.Sounds, Abd Soft, No tenderness,    Extremity/Skin:- No  edema,   good pulses Psych-affect is appropriate, oriented x3 Neuro-no new focal deficits, no tremors    Data Review   CBC w Diff:  Lab Results  Component Value Date   WBC 5.2 06/12/2018   HGB 9.6 (L) 06/12/2018   HCT 29.5 (L) 06/12/2018   PLT 126 (L) 06/12/2018   LYMPHOPCT 20 06/10/2018   MONOPCT 9 06/10/2018   EOSPCT 4 06/10/2018   BASOPCT 1 06/10/2018    CMP:  Lab Results  Component Value Date   NA 140 06/11/2018   K 3.7 06/11/2018   CL 109 06/11/2018   CO2 22 06/11/2018   BUN 11 06/11/2018   CREATININE 0.82 06/11/2018   PROT 7.3 06/10/2018   ALBUMIN 4.0 06/10/2018   BILITOT 0.8 06/10/2018   ALKPHOS 82 06/10/2018   AST 20 06/10/2018   ALT 18 06/10/2018  .   Total Discharge time is about 33 minutes  Shon Haleourage Eric Gallagher M.D on 06/12/2018 at 1:43 PM  Go to [email or web address] -  for contact info  [facility] - Office  [phone]

## 2018-12-06 ENCOUNTER — Ambulatory Visit: Payer: Self-pay | Admitting: Orthopaedic Surgery

## 2019-02-20 NOTE — Progress Notes (Signed)
Virtual Visit via Telephone Note   This visit type was conducted due to national recommendations for restrictions regarding the COVID-19 Pandemic (e.g. social distancing) in an effort to limit this patient's exposure and mitigate transmission in our community.  Due to his co-morbid illnesses, this patient is at least at moderate risk for complications without adequate follow up.  This format is felt to be most appropriate for this patient at this time.  The patient did not have access to video technology/had technical difficulties with video requiring transitioning to audio format only (telephone).  All issues noted in this document were discussed and addressed.  No physical exam could be performed with this format.  Please refer to the patient's chart for his  consent to telehealth for [facility].   Date:  02/21/2019   ID:  Eric Gallagher, DOB Jul 18, 1940, MRN [id number]  Patient Location: Home Provider Location: Office  PCP:  Georgann Housekeeper, MD  Cardiologist:  Lesleigh Noe, MD  Electrophysiologist:  None   Evaluation Performed:  Follow-Up Visit  Chief Complaint:  Atrial fib   History of Present Illness:    Eric Gallagher is a 79 y.o. male with CAD, chronic a fib.  He has a hx of CAD status post DES to the LAD 2005(cath 07/2014--->90% third left ventricular branch of the RCA;50-70% obtuse marginal;55% proximal LAD), chronic atrial fibrillation, essentialhypertension, diabetes mellitus type 2, chronic thrombocytopenia and chronic dyspnea and fatigue.  On prior visits he denied chest pain, has chronic dyspnea but no orthopnea.  He has not had syncope.  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  He is on chronic Coumadin therapy managed by his primary physician, Dr. Eula Listen.  He denies nitroglycerin use.  He has not had significant lower extremity swelling.  He is able to sleep laying flat.  In 06/2018 hospitalized for acute lower GI bled.  Stopped, conservative management. He  has had both hips replaced and doing well.   Today he tells me he is doing great.  He can walk to mailbox and pull his trash cans down without SOB or pain.  He is worried he feels too good.  No further GI bleeding.  His INR is checked monthly at PCP.   No awareness of heart rate. No bleeding in stool or urine.   He does note his BP diastolic is elevated due to increased salt intake. Had some ankle swelling over Christmas due to increased ham intake.   He will watch diet more closely.   The patient does not have symptoms concerning for COVID-19 infection (fever, chills, cough, or new shortness of breath). He does plan to receive vaccine.   Past Medical History:  Diagnosis Date  . Anginal pain (HCC)    h/o   . Arrhythmia    Chronic AFIB, no anti-coag patient desire and low platelet count  . Arthritis    both hips   . BPH (benign prostatic hyperplasia)   . Chronic a-fib (HCC)   . Coronary artery disease    NSTEMI in 2005, Taxus DES LAD  . Diabetes mellitus without complication (HCC)    type II, some neuropathy  . Dysrhythmia    afib  . ED (erectile dysfunction)   . History of kidney stones 2012   passed spontaneously  . Hyperlipidemia   . Hypertension   . Old MI (myocardial infarction)   . Stroke (HCC) 2017  . Thrombocytopenia (HCC)    chronic   Past Surgical History:  Procedure Laterality Date  . APPENDECTOMY     as a teen  . CARDIAC CATHETERIZATION N/A 07/24/2014   Procedure: Right/Left Heart Cath and Coronary Angiography;  Surgeon: Lyn Records, MD;  Location: [facility] INVASIVE CV LAB;  Service: Cardiovascular;  Laterality: N/A;  . EYE SURGERY Left 2016   retinal surgery, cataracts removed- both eyes  . HERNIA REPAIR  1998   L inguinal   . I & D EXTREMITY Right 06/18/2014   Procedure: OPEN TREATMENT RIGHT THUMB, INCOMPLETE AMPUTATION.  Washing and dressing applied to left thumb.;  Surgeon: Mack Hook, MD;  Location: [facility] OR;  Service: Orthopedics;  Laterality: Right;  . TOTAL  HIP ARTHROPLASTY Right 01/04/2018   Procedure: RIGHT TOTAL HIP ARTHROPLASTY ANTERIOR APPROACH;  Surgeon: Kathryne Hitch, MD;  Location: [facility] OR;  Service: Orthopedics;  Laterality: Right;  . TOTAL HIP ARTHROPLASTY Left 03/01/2018   Procedure: LEFT TOTAL HIP ARTHROPLASTY ANTERIOR APPROACH;  Surgeon: Kathryne Hitch, MD;  Location: [facility] OR;  Service: Orthopedics;  Laterality: Left;     Current Meds  Medication Sig  . ACCU-CHEK AVIVA PLUS test strip Use as directed weekly.  Marland Kitchen ACCU-CHEK SOFTCLIX LANCETS lancets Use as directed weekly.  Marland Kitchen alfuzosin (UROXATRAL) 10 MG 24 hr tablet Take 10 mg by mouth at bedtime.   . benazepril (LOTENSIN) 20 MG tablet Take 20 mg by mouth daily.  Marland Kitchen glipiZIDE (GLUCOTROL) 5 MG tablet Take 2.5 mg by mouth at bedtime.  . metFORMIN (GLUCOPHAGE) 1000 MG tablet Take 1,000 mg by mouth 2 (two) times daily with a meal.  . metoprolol tartrate (LOPRESSOR) 50 MG tablet Take 1 tablet (50 mg total) by mouth 2 (two) times daily.  . nitroGLYCERIN (NITROSTAT) 0.4 MG SL tablet Place 0.4 mg under the tongue every 5 (five) minutes as needed for chest pain.  . simvastatin (ZOCOR) 40 MG tablet Take 40 mg by mouth every evening.  . warfarin (COUMADIN) 5 MG tablet Take 1 tablet (5 mg total) by mouth See admin instructions. Take 5 mg by mouth daily except for Saturdays ---Restart Coumadin on Wednesday 06/15/2018     Allergies:   Flomax [tamsulosin hcl]   Social History   Tobacco Use  . Smoking status: Former Smoker    Quit date: 12/29/1965    Years since quitting: 53.1  . Smokeless tobacco: Never Used  Substance Use Topics  . Alcohol use: No    Alcohol/week: 0.0 standard drinks  . Drug use: No     Family Hx: The patient's family history includes Aneurysm in his father; CVA in his mother; Heart attack in his maternal aunt; Heart disease in his brother, brother, and maternal aunt.  ROS:   Please see the history of present illness.    General:no colds or fevers, no  weight changes Skin:no rashes or ulcers HEENT:no blurred vision, no congestion CV:see HPI PUL:see HPI GI:no diarrhea constipation or melena, no indigestion GU:no hematuria, no dysuria MS:no joint pain, no claudication Neuro:no syncope, no lightheadedness Endo:+ diabetes good control per pt with CBG 107 today, no thyroid disease  All other systems reviewed and are negative.   Prior CV studies:   The following studies were reviewed today:  Echo 07/2016 Study Conclusions  - Left ventricle: The cavity size was mildly dilated. Systolic   function was normal. The estimated ejection fraction was in the   range of 55% to 60%. Wall motion was normal; there were no   regional wall motion abnormalities. - Left atrium: The atrium was severely  dilated. - Right ventricle: The cavity size was mildly dilated. Wall   thickness was normal. - Right atrium: The atrium was severely dilated. - Atrial septum: No defect or patent foramen ovale was identified. - Pulmonary arteries: PA peak pressure: 32 mm Hg (S).  ------------------------------------------------------------------- Nuc study 06/2016  The left ventricular ejection fraction is normal (55-65%).  Nuclear stress EF: 56%.  No T wave inversion was noted during stress.  There was no ST segment deviation noted during stress.  Defect 1: There is a small defect of mild severity.  This is a low risk study.   Small size, mild intensity fixed apical/apical lateral perfusion defect, likely artifact. No reversible ischemia. LVEF 56% with normal wall motion. This is a low risk study.   Labs/Other Tests and Data Reviewed:    EKG:  An ECG dated 06/10/18 was personally reviewed today and demonstrated:  a fib with rate control of 78 and diffused non specific St abnroamity but no changes from previous EKGs all stable.  Recent Labs: 06/10/2018: ALT 18 06/11/2018: BUN 11; Creatinine, Ser 0.82; Potassium 3.7; Sodium 140 06/12/2018: Hemoglobin 9.6;  Platelets 126   Recent Lipid Panel No results found for: CHOL, TRIG, HDL, CHOLHDL, LDLCALC, LDLDIRECT  Wt Readings from Last 3 Encounters:  02/21/19 235 lb (106.6 kg)  06/10/18 218 lb 11.1 oz (99.2 kg)  03/01/18 226 lb 3.2 oz (102.6 kg)     Objective:    Vital Signs:  BP (!) 138/99   Pulse 90   Ht 6\' 2"  (1.88 m)   Wt 235 lb (106.6 kg)   BMI 30.17 kg/m    VITAL SIGNS:  reviewed  General: NAD Lungs can speak in complete sentences without SOB Neuro A&O X 3  Psych: pleasant affect  ASSESSMENT & PLAN:    1. Chronic atrial fib, no awareness of HR. Feels well on anticoagulation per PCP on coumadin.   2. CAD without angina or SOB.  Feels great. Better than he did a few months ago 3. Hx of GI Bleed no further bleeding 4. Hx CVA stable 5. DM-2 per PCP and stable, controlled 6. HTN stable, today diastolic elevated due to diet.  COVID-19 Education: The signs and symptoms of COVID-19 were discussed with the patient and how to seek care for testing (follow up with PCP or arrange E-visit).  The importance of social distancing was discussed today.  Time:   Today, I have spent 7 minutes with the patient with telehealth technology discussing the above problems.     Medication Adjustments/Labs and Tests Ordered: Current medicines are reviewed at length with the patient today.  Concerns regarding medicines are outlined above.   Tests Ordered: No orders of the defined types were placed in this encounter.   Medication Changes: No orders of the defined types were placed in this encounter.   Follow Up:  In Person in 1 year(s)  Signed, Cecilie Kicks, NP  02/21/2019 11:55 AM    [place]

## 2019-02-21 ENCOUNTER — Other Ambulatory Visit: Payer: Self-pay

## 2019-02-21 ENCOUNTER — Telehealth: Payer: Self-pay

## 2019-02-21 ENCOUNTER — Encounter: Payer: Self-pay | Admitting: Cardiology

## 2019-02-21 ENCOUNTER — Telehealth (INDEPENDENT_AMBULATORY_CARE_PROVIDER_SITE_OTHER): Payer: Medicare Other | Admitting: Cardiology

## 2019-02-21 VITALS — BP 138/99 | HR 90 | Ht 74.0 in | Wt 235.0 lb

## 2019-02-21 DIAGNOSIS — I639 Cerebral infarction, unspecified: Secondary | ICD-10-CM

## 2019-02-21 DIAGNOSIS — E1159 Type 2 diabetes mellitus with other circulatory complications: Secondary | ICD-10-CM

## 2019-02-21 DIAGNOSIS — I10 Essential (primary) hypertension: Secondary | ICD-10-CM

## 2019-02-21 DIAGNOSIS — I482 Chronic atrial fibrillation, unspecified: Secondary | ICD-10-CM

## 2019-02-21 DIAGNOSIS — I2510 Atherosclerotic heart disease of native coronary artery without angina pectoris: Secondary | ICD-10-CM

## 2019-02-21 NOTE — Telephone Encounter (Signed)
Spoke to Eric Gallagher who consented to a phone visit with Alease Frame  Dorma Russell

## 2019-02-21 NOTE — Patient Instructions (Signed)
Medication Instructions:  Your physician recommends that you continue on your current medications as directed. Please refer to the Current Medication list given to you today.  *If you need a refill on your cardiac medications before your next appointment, please call your pharmacy*  Lab Work: None ordered  If you have labs (blood work) drawn today and your tests are completely normal, you will receive your results only by: [facility] MyChart Message (if you have MyChart) OR . A paper copy in the mail If you have any lab test that is abnormal or we need to change your treatment, we will call you to review the results.  Testing/Procedures: None ordered  Follow-Up: At [facility], you and your health needs are our priority.  As part of our continuing mission to provide you with exceptional heart care, we have created designated Provider Care Teams.  These Care Teams include your primary Cardiologist (physician) and Advanced Practice Providers (APPs -  Physician Assistants and Nurse Practitioners) who all work together to provide you with the care you need, when you need it.  Your next appointment:   12 month(s)  The format for your next appointment:   In Person  Provider:   You may see Lesleigh Noe, MD or one of the following Advanced Practice Providers on your designated Care Team:    Norma Fredrickson, NP  Nada Boozer, NP  Georgie Chard, NP   Other Instructions

## 2020-01-20 IMAGING — DX DG PORTABLE PELVIS
1 series · 1 of 1 positions shown · non-contrast
Comparison: 01/04/2018

CLINICAL DATA: Left total hip arthroplasty

EXAM:
PORTABLE PELVIS 1-2 VIEWS

[pelvis]
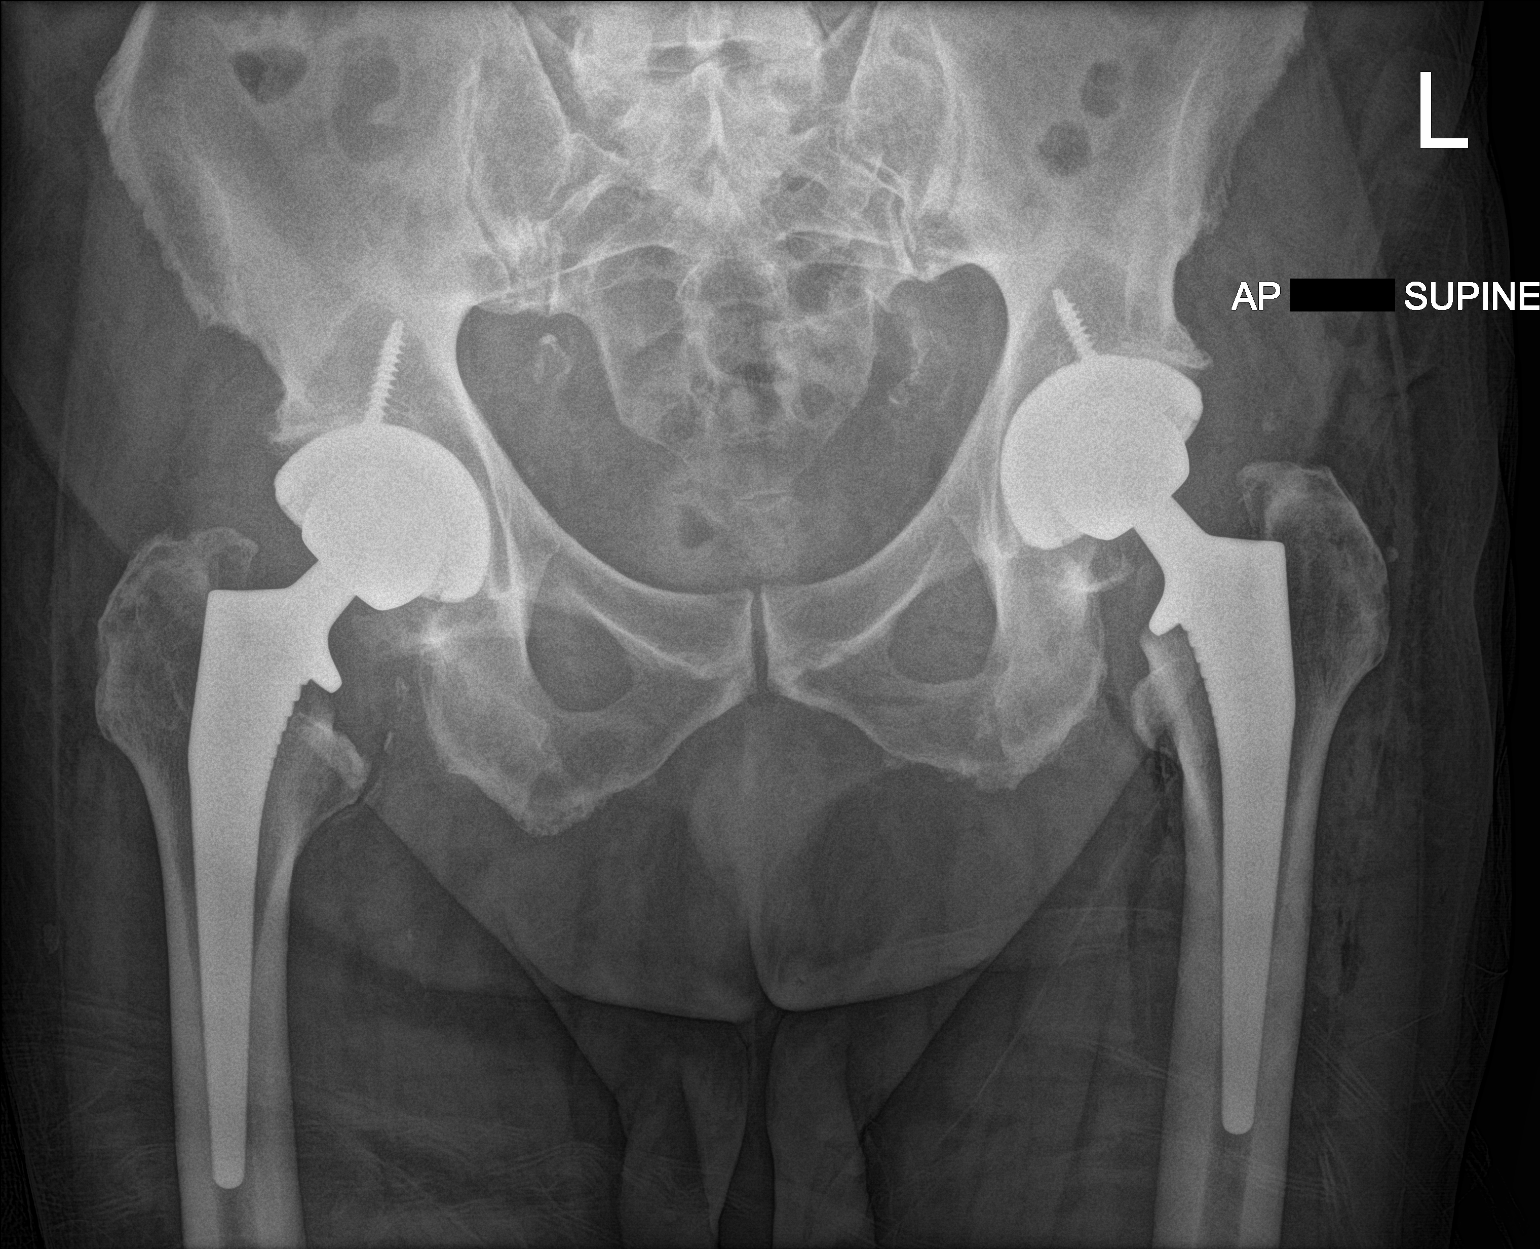

[1 of 1 positions shown; findings below may reference images not displayed]

FINDINGS: Left total hip arthroplasty is in place. There is anatomic alignment
of the osseous and prostatic structures. There is no breakage or
loosening of the hardware. Right total hip arthroplasty is stable in
appearance.
IMPRESSION: Left total hip arthroplasty is anatomically aligned.

## 2020-01-20 IMAGING — RF DG C-ARM 61-120 MIN
1 series · 4 of 4 positions shown · non-contrast
Comparison: 01/04/2018 right hip films.

CLINICAL DATA: 77-year-old male post left hip replacement. Initial
encounter.

EXAM:
OPERATIVE left HIP (WITH PELVIS IF PERFORMED) 4 VIEWS
TECHNIQUE: Fluoroscopic spot image(s) were submitted for interpretation
post-operatively.
Fluoroscopic time: 18 seconds.

[Series 1: unknown protocol · 4 of 4 slices shown]
[im 1/4]
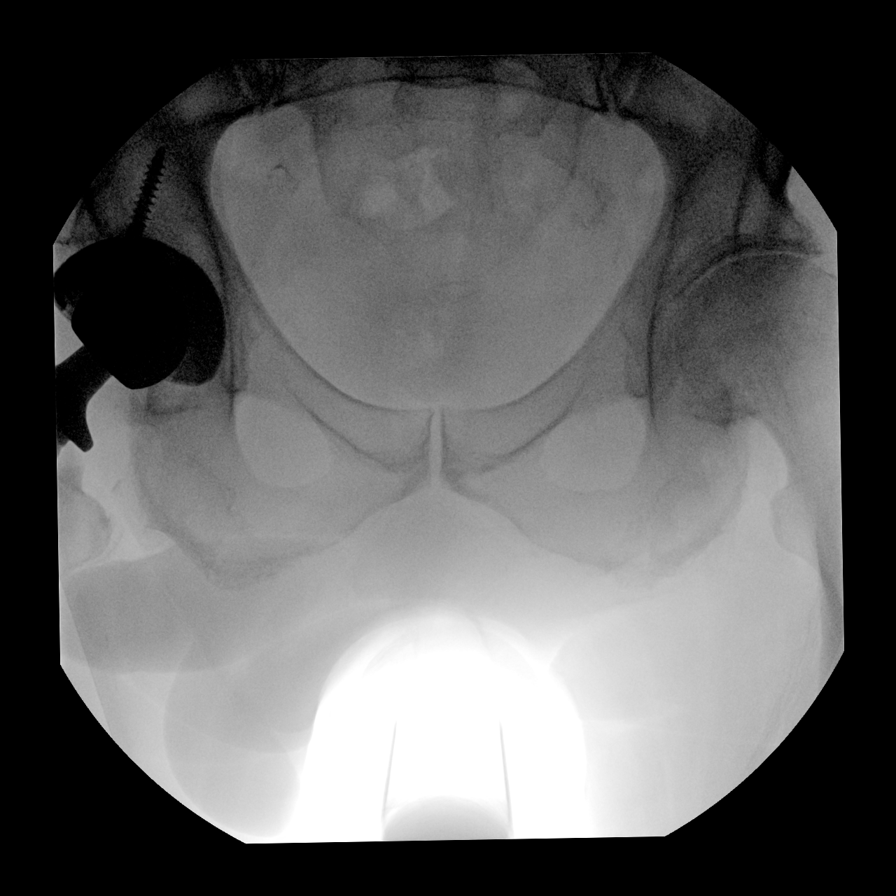
[im 2/4]
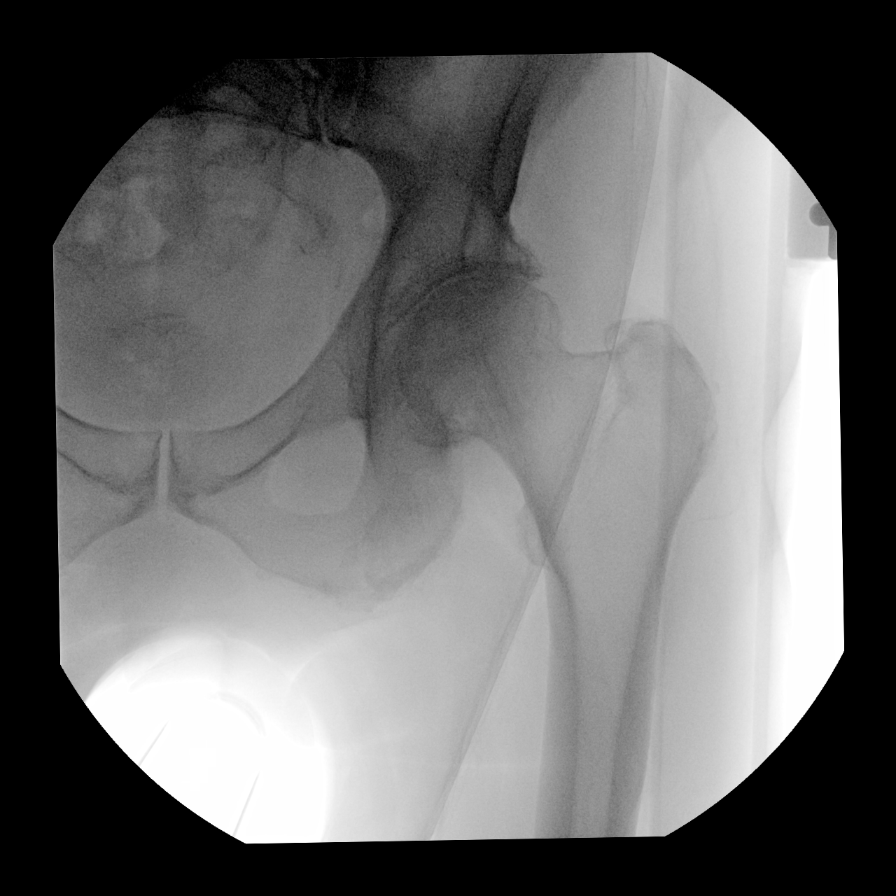
[im 3/4]
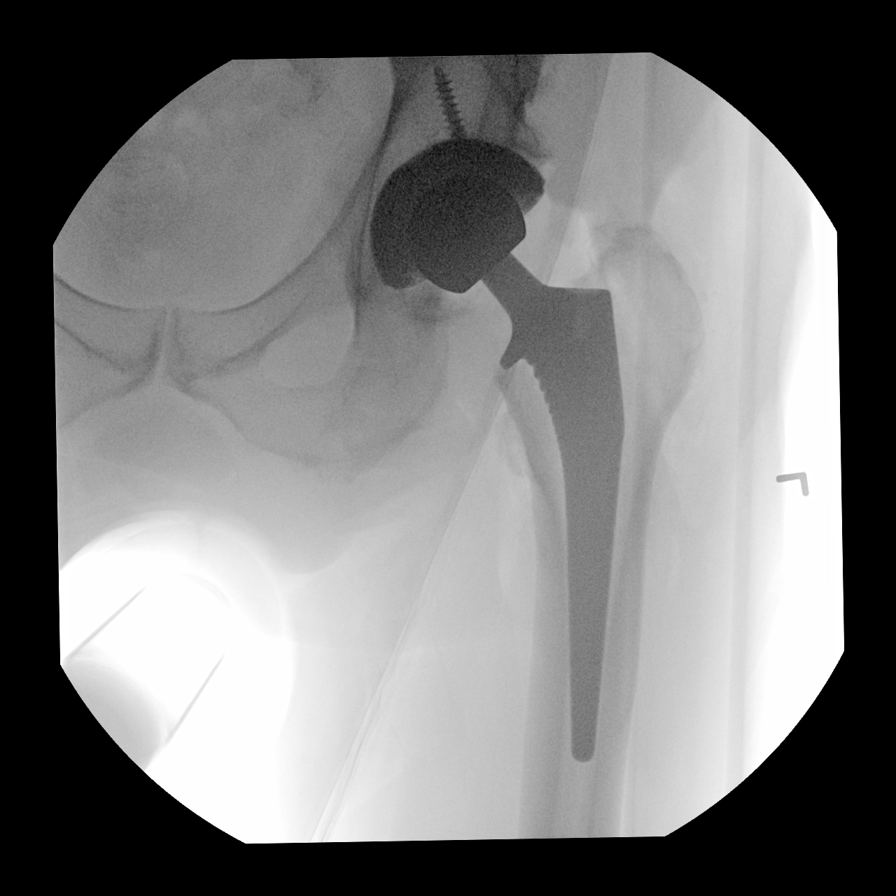
[im 4/4]
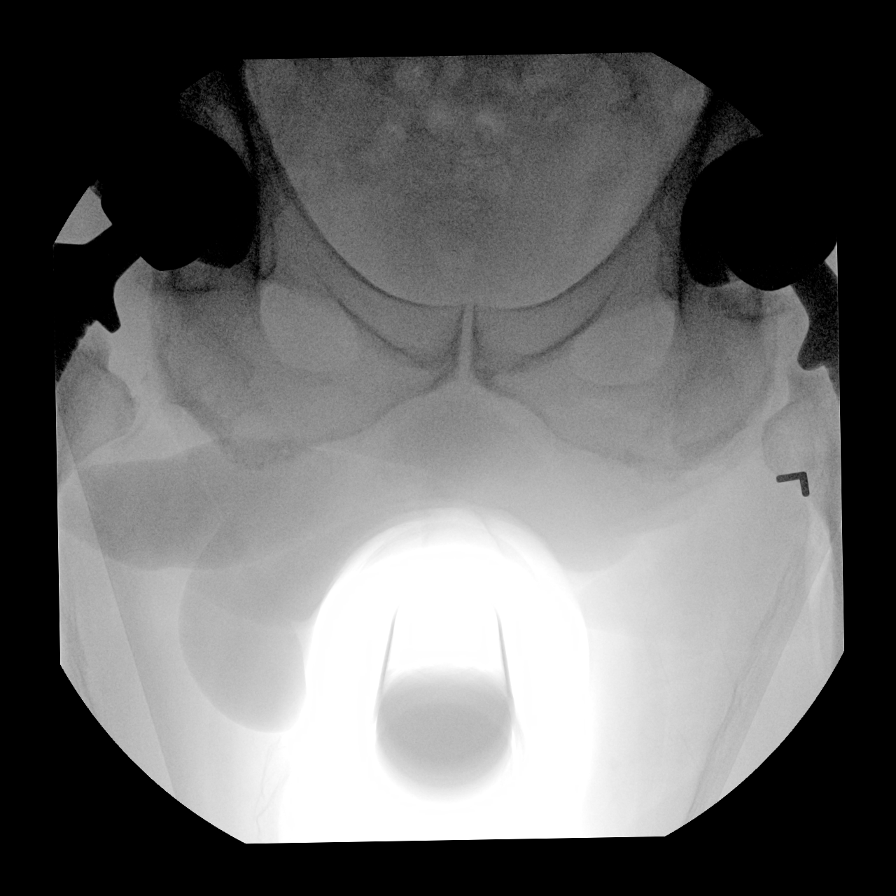

[4 of 4 positions shown; findings below may reference images not displayed]

FINDINGS: Post total left hip replacement which appears in satisfactory
position without complication noted on frontal projection.
IMPRESSION: Post recent left hip replacement.  Remote right hip replacement.

## 2020-02-12 DIAGNOSIS — Z7901 Long term (current) use of anticoagulants: Secondary | ICD-10-CM | POA: Diagnosis not present

## 2020-03-05 DIAGNOSIS — E782 Mixed hyperlipidemia: Secondary | ICD-10-CM | POA: Diagnosis not present

## 2020-03-05 DIAGNOSIS — D6869 Other thrombophilia: Secondary | ICD-10-CM | POA: Diagnosis not present

## 2020-03-05 DIAGNOSIS — I252 Old myocardial infarction: Secondary | ICD-10-CM | POA: Diagnosis not present

## 2020-03-05 DIAGNOSIS — D696 Thrombocytopenia, unspecified: Secondary | ICD-10-CM | POA: Diagnosis not present

## 2020-03-05 DIAGNOSIS — E1140 Type 2 diabetes mellitus with diabetic neuropathy, unspecified: Secondary | ICD-10-CM | POA: Diagnosis not present

## 2020-03-05 DIAGNOSIS — Z23 Encounter for immunization: Secondary | ICD-10-CM | POA: Diagnosis not present

## 2020-03-05 DIAGNOSIS — Z1389 Encounter for screening for other disorder: Secondary | ICD-10-CM | POA: Diagnosis not present

## 2020-03-05 DIAGNOSIS — R269 Unspecified abnormalities of gait and mobility: Secondary | ICD-10-CM | POA: Diagnosis not present

## 2020-03-05 DIAGNOSIS — I10 Essential (primary) hypertension: Secondary | ICD-10-CM | POA: Diagnosis not present

## 2020-03-05 DIAGNOSIS — Z7901 Long term (current) use of anticoagulants: Secondary | ICD-10-CM | POA: Diagnosis not present

## 2020-03-05 DIAGNOSIS — I69359 Hemiplegia and hemiparesis following cerebral infarction affecting unspecified side: Secondary | ICD-10-CM | POA: Diagnosis not present

## 2020-03-05 DIAGNOSIS — I482 Chronic atrial fibrillation, unspecified: Secondary | ICD-10-CM | POA: Diagnosis not present

## 2020-03-05 DIAGNOSIS — Z0000 Encounter for general adult medical examination without abnormal findings: Secondary | ICD-10-CM | POA: Diagnosis not present

## 2020-03-08 DIAGNOSIS — E782 Mixed hyperlipidemia: Secondary | ICD-10-CM | POA: Diagnosis not present

## 2020-03-08 DIAGNOSIS — I10 Essential (primary) hypertension: Secondary | ICD-10-CM | POA: Diagnosis not present

## 2020-03-08 DIAGNOSIS — I482 Chronic atrial fibrillation, unspecified: Secondary | ICD-10-CM | POA: Diagnosis not present

## 2020-03-08 DIAGNOSIS — I252 Old myocardial infarction: Secondary | ICD-10-CM | POA: Diagnosis not present

## 2020-03-08 DIAGNOSIS — M1990 Unspecified osteoarthritis, unspecified site: Secondary | ICD-10-CM | POA: Diagnosis not present

## 2020-03-08 DIAGNOSIS — E1140 Type 2 diabetes mellitus with diabetic neuropathy, unspecified: Secondary | ICD-10-CM | POA: Diagnosis not present

## 2020-03-08 DIAGNOSIS — E119 Type 2 diabetes mellitus without complications: Secondary | ICD-10-CM | POA: Diagnosis not present

## 2020-03-08 DIAGNOSIS — I2510 Atherosclerotic heart disease of native coronary artery without angina pectoris: Secondary | ICD-10-CM | POA: Diagnosis not present

## 2020-03-11 DIAGNOSIS — Z7901 Long term (current) use of anticoagulants: Secondary | ICD-10-CM | POA: Diagnosis not present

## 2020-04-08 DIAGNOSIS — I482 Chronic atrial fibrillation, unspecified: Secondary | ICD-10-CM | POA: Diagnosis not present

## 2020-04-15 DIAGNOSIS — I2510 Atherosclerotic heart disease of native coronary artery without angina pectoris: Secondary | ICD-10-CM | POA: Diagnosis not present

## 2020-04-15 DIAGNOSIS — E782 Mixed hyperlipidemia: Secondary | ICD-10-CM | POA: Diagnosis not present

## 2020-04-15 DIAGNOSIS — E1140 Type 2 diabetes mellitus with diabetic neuropathy, unspecified: Secondary | ICD-10-CM | POA: Diagnosis not present

## 2020-04-15 DIAGNOSIS — I10 Essential (primary) hypertension: Secondary | ICD-10-CM | POA: Diagnosis not present

## 2020-04-15 DIAGNOSIS — I252 Old myocardial infarction: Secondary | ICD-10-CM | POA: Diagnosis not present

## 2020-04-15 DIAGNOSIS — M1990 Unspecified osteoarthritis, unspecified site: Secondary | ICD-10-CM | POA: Diagnosis not present

## 2020-04-15 DIAGNOSIS — E119 Type 2 diabetes mellitus without complications: Secondary | ICD-10-CM | POA: Diagnosis not present

## 2020-05-06 DIAGNOSIS — Z7901 Long term (current) use of anticoagulants: Secondary | ICD-10-CM | POA: Diagnosis not present

## 2020-05-13 DIAGNOSIS — Z7901 Long term (current) use of anticoagulants: Secondary | ICD-10-CM | POA: Diagnosis not present

## 2020-06-04 DIAGNOSIS — I69359 Hemiplegia and hemiparesis following cerebral infarction affecting unspecified side: Secondary | ICD-10-CM | POA: Diagnosis not present

## 2020-06-04 DIAGNOSIS — E1140 Type 2 diabetes mellitus with diabetic neuropathy, unspecified: Secondary | ICD-10-CM | POA: Diagnosis not present

## 2020-06-04 DIAGNOSIS — D696 Thrombocytopenia, unspecified: Secondary | ICD-10-CM | POA: Diagnosis not present

## 2020-06-04 DIAGNOSIS — D6869 Other thrombophilia: Secondary | ICD-10-CM | POA: Diagnosis not present

## 2020-06-04 DIAGNOSIS — I482 Chronic atrial fibrillation, unspecified: Secondary | ICD-10-CM | POA: Diagnosis not present

## 2020-06-04 DIAGNOSIS — I10 Essential (primary) hypertension: Secondary | ICD-10-CM | POA: Diagnosis not present

## 2020-06-04 DIAGNOSIS — E782 Mixed hyperlipidemia: Secondary | ICD-10-CM | POA: Diagnosis not present

## 2020-06-11 DIAGNOSIS — I482 Chronic atrial fibrillation, unspecified: Secondary | ICD-10-CM | POA: Diagnosis not present

## 2020-07-09 DIAGNOSIS — I482 Chronic atrial fibrillation, unspecified: Secondary | ICD-10-CM | POA: Diagnosis not present

## 2020-08-13 DIAGNOSIS — I482 Chronic atrial fibrillation, unspecified: Secondary | ICD-10-CM | POA: Diagnosis not present

## 2020-09-05 DIAGNOSIS — D6869 Other thrombophilia: Secondary | ICD-10-CM | POA: Diagnosis not present

## 2020-09-05 DIAGNOSIS — D696 Thrombocytopenia, unspecified: Secondary | ICD-10-CM | POA: Diagnosis not present

## 2020-09-05 DIAGNOSIS — I69359 Hemiplegia and hemiparesis following cerebral infarction affecting unspecified side: Secondary | ICD-10-CM | POA: Diagnosis not present

## 2020-09-05 DIAGNOSIS — E1140 Type 2 diabetes mellitus with diabetic neuropathy, unspecified: Secondary | ICD-10-CM | POA: Diagnosis not present

## 2020-09-05 DIAGNOSIS — I10 Essential (primary) hypertension: Secondary | ICD-10-CM | POA: Diagnosis not present

## 2020-09-05 DIAGNOSIS — I2510 Atherosclerotic heart disease of native coronary artery without angina pectoris: Secondary | ICD-10-CM | POA: Diagnosis not present

## 2020-09-05 DIAGNOSIS — I252 Old myocardial infarction: Secondary | ICD-10-CM | POA: Diagnosis not present

## 2020-09-10 DIAGNOSIS — Z7901 Long term (current) use of anticoagulants: Secondary | ICD-10-CM | POA: Diagnosis not present

## 2020-10-15 DIAGNOSIS — Z7901 Long term (current) use of anticoagulants: Secondary | ICD-10-CM | POA: Diagnosis not present

## 2020-11-15 DIAGNOSIS — Z7901 Long term (current) use of anticoagulants: Secondary | ICD-10-CM | POA: Diagnosis not present

## 2020-11-15 DIAGNOSIS — Z23 Encounter for immunization: Secondary | ICD-10-CM | POA: Diagnosis not present

## 2020-12-16 DIAGNOSIS — Z7901 Long term (current) use of anticoagulants: Secondary | ICD-10-CM | POA: Diagnosis not present

## 2020-12-18 DIAGNOSIS — E119 Type 2 diabetes mellitus without complications: Secondary | ICD-10-CM | POA: Diagnosis not present

## 2020-12-18 DIAGNOSIS — H26491 Other secondary cataract, right eye: Secondary | ICD-10-CM | POA: Diagnosis not present

## 2020-12-18 DIAGNOSIS — H35373 Puckering of macula, bilateral: Secondary | ICD-10-CM | POA: Diagnosis not present

## 2020-12-18 DIAGNOSIS — Z961 Presence of intraocular lens: Secondary | ICD-10-CM | POA: Diagnosis not present

## 2020-12-29 DIAGNOSIS — K122 Cellulitis and abscess of mouth: Secondary | ICD-10-CM | POA: Diagnosis not present

## 2020-12-29 DIAGNOSIS — J029 Acute pharyngitis, unspecified: Secondary | ICD-10-CM | POA: Diagnosis not present

## 2021-01-13 DIAGNOSIS — Z7901 Long term (current) use of anticoagulants: Secondary | ICD-10-CM | POA: Diagnosis not present

## 2021-01-22 DIAGNOSIS — H53453 Other localized visual field defect, bilateral: Secondary | ICD-10-CM | POA: Diagnosis not present

## 2021-01-22 DIAGNOSIS — H5319 Other subjective visual disturbances: Secondary | ICD-10-CM | POA: Diagnosis not present

## 2021-02-11 DIAGNOSIS — Z7901 Long term (current) use of anticoagulants: Secondary | ICD-10-CM | POA: Diagnosis not present

## 2021-03-11 DIAGNOSIS — Z7901 Long term (current) use of anticoagulants: Secondary | ICD-10-CM | POA: Diagnosis not present

## 2021-04-08 DIAGNOSIS — Z7901 Long term (current) use of anticoagulants: Secondary | ICD-10-CM | POA: Diagnosis not present

## 2021-05-06 DIAGNOSIS — Z7901 Long term (current) use of anticoagulants: Secondary | ICD-10-CM | POA: Diagnosis not present

## 2021-06-03 DIAGNOSIS — Z7901 Long term (current) use of anticoagulants: Secondary | ICD-10-CM | POA: Diagnosis not present

## 2021-07-01 DIAGNOSIS — Z7901 Long term (current) use of anticoagulants: Secondary | ICD-10-CM | POA: Diagnosis not present

## 2021-07-28 DIAGNOSIS — Z7901 Long term (current) use of anticoagulants: Secondary | ICD-10-CM | POA: Diagnosis not present

## 2021-08-25 DIAGNOSIS — Z7901 Long term (current) use of anticoagulants: Secondary | ICD-10-CM | POA: Diagnosis not present

## 2021-09-08 DIAGNOSIS — I10 Essential (primary) hypertension: Secondary | ICD-10-CM | POA: Diagnosis not present

## 2021-09-08 DIAGNOSIS — D6869 Other thrombophilia: Secondary | ICD-10-CM | POA: Diagnosis not present

## 2021-09-08 DIAGNOSIS — D696 Thrombocytopenia, unspecified: Secondary | ICD-10-CM | POA: Diagnosis not present

## 2021-09-08 DIAGNOSIS — I482 Chronic atrial fibrillation, unspecified: Secondary | ICD-10-CM | POA: Diagnosis not present

## 2021-09-08 DIAGNOSIS — R2689 Other abnormalities of gait and mobility: Secondary | ICD-10-CM | POA: Diagnosis not present

## 2021-09-08 DIAGNOSIS — I69359 Hemiplegia and hemiparesis following cerebral infarction affecting unspecified side: Secondary | ICD-10-CM | POA: Diagnosis not present

## 2021-09-08 DIAGNOSIS — R0609 Other forms of dyspnea: Secondary | ICD-10-CM | POA: Diagnosis not present

## 2021-09-08 DIAGNOSIS — I252 Old myocardial infarction: Secondary | ICD-10-CM | POA: Diagnosis not present

## 2021-09-08 DIAGNOSIS — E782 Mixed hyperlipidemia: Secondary | ICD-10-CM | POA: Diagnosis not present

## 2021-09-08 DIAGNOSIS — E1140 Type 2 diabetes mellitus with diabetic neuropathy, unspecified: Secondary | ICD-10-CM | POA: Diagnosis not present

## 2021-09-08 DIAGNOSIS — I2510 Atherosclerotic heart disease of native coronary artery without angina pectoris: Secondary | ICD-10-CM | POA: Diagnosis not present

## 2021-09-08 DIAGNOSIS — Z0000 Encounter for general adult medical examination without abnormal findings: Secondary | ICD-10-CM | POA: Diagnosis not present

## 2021-09-15 DIAGNOSIS — E538 Deficiency of other specified B group vitamins: Secondary | ICD-10-CM | POA: Diagnosis not present

## 2021-09-16 DIAGNOSIS — E538 Deficiency of other specified B group vitamins: Secondary | ICD-10-CM | POA: Diagnosis not present

## 2021-09-17 DIAGNOSIS — E538 Deficiency of other specified B group vitamins: Secondary | ICD-10-CM | POA: Diagnosis not present

## 2021-09-18 DIAGNOSIS — E538 Deficiency of other specified B group vitamins: Secondary | ICD-10-CM | POA: Diagnosis not present

## 2021-09-22 DIAGNOSIS — Z7901 Long term (current) use of anticoagulants: Secondary | ICD-10-CM | POA: Diagnosis not present

## 2021-09-24 NOTE — Progress Notes (Signed)
Cardiology Office Note:    Date:  09/26/2021   ID:  Jenel Lucks, DOB 10-Aug-1940, MRN [id number]  PCP:  Wenda Low, MD   [facility] Providers Cardiologist:  Sinclair Grooms, MD     Referring MD: Wenda Low, MD   Chief Complaint: shortness of breath  History of Present Illness:    Eric Gallagher is a very pleasant 81 y.o. male with a hx of CAD s/p DES to LAD 2005, chronic atrial fibrillation, HTN, type 2 diabetes, chronic thrombocytopenia, chronic dyspnea and fatigue  DES to LAD in 2005. Repeat cath 07/2014 revealed 90% 3rd left ventricular branch of RCA, 50-70% obtuse marginal, 55% pLAD. 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  He is on chronic Coumadin therapy managed by PCP. Hospitalized in 06/2018 for acute lower GI bleed.  His last office visit was via telemedicine on 02/21/2019 with Cecilie Kicks, NP. No changes were made to his treatment plan and he was advised to return in 1 year for follow-up.  Today, he is here with his son for evaluation of shortness of breath. Reports symptoms have been present for about 1 year, not significantly worse recently but concerning to him that he is not feeling better. Cannot walk 20 feet without stopping. Lives at home with his wife, "piddles outside" and does the cooking and some cleaning. Retired from Education officer, museum. Feels like he is going to pass out if he bends over.  If he does not hold onto something he will fall forward. Has to sit in a chair to pull weeds. Also has lightheadedness when goes from sitting to standing. Home BP 128/70 mmHg. Admits symptoms feel similar to previous angina. No chest pain, PND, orthopnea, fatigue, or weakness. Mild bilateral LE edema. Drinks 6-7 bottles water daily. Admits he eats a pretty unrestricted diet.   Past Medical History:  Diagnosis Date   Anginal pain ([place])    h/o    Arrhythmia    Chronic AFIB, no anti-coag patient desire and low platelet count   Arthritis    both hips    BPH  (benign prostatic hyperplasia)    Chronic a-fib ([facility])    Coronary artery disease    NSTEMI in 2005, Taxus DES LAD   Diabetes mellitus without complication ([place])    type II, some neuropathy   Dysrhythmia    afib   ED (erectile dysfunction)    History of kidney stones 2012   passed spontaneously   Hyperlipidemia    Hypertension    Old MI (myocardial infarction)    Stroke ([place]) 2017   Thrombocytopenia ([place])    chronic    Past Surgical History:  Procedure Laterality Date   APPENDECTOMY     as a teen   CARDIAC CATHETERIZATION N/A 07/24/2014   Procedure: Right/Left Heart Cath and Coronary Angiography;  Surgeon: Belva Crome, MD;  Location: [place] [facility];  Service: Cardiovascular;  Laterality: N/A;   EYE SURGERY Left 2016   retinal surgery, cataracts removed- both eyes   HERNIA REPAIR  1998   L inguinal    I & D EXTREMITY Right 06/18/2014   Procedure: OPEN TREATMENT RIGHT THUMB, INCOMPLETE AMPUTATION.  Washing and dressing applied to left thumb.;  Surgeon: Milly Jakob, MD;  Location: [facility];  Service: Orthopedics;  Laterality: Right;   TOTAL HIP ARTHROPLASTY Right 01/04/2018   Procedure: RIGHT TOTAL HIP ARTHROPLASTY ANTERIOR APPROACH;  Surgeon: Mcarthur Rossetti, MD;  Location: [place];  Service: Orthopedics;  Laterality:  Right;   TOTAL HIP ARTHROPLASTY Left 03/01/2018   Procedure: LEFT TOTAL HIP ARTHROPLASTY ANTERIOR APPROACH;  Surgeon: Kathryne Hitch, MD;  Location: [facility];  Service: Orthopedics;  Laterality: Left;    Current Medications: Current Meds  Medication Sig   ACCU-CHEK AVIVA PLUS test strip Use as directed weekly.   ACCU-CHEK SOFTCLIX LANCETS lancets Use as directed weekly.   amLODipine (NORVASC) 5 MG tablet Take 5 mg by mouth daily.   benazepril (LOTENSIN) 20 MG tablet Take 20 mg by mouth daily.   finasteride (PROSCAR) 5 MG tablet Take 5 mg by mouth daily.   glipiZIDE (GLUCOTROL) 5 MG tablet Take 2.5 mg by mouth at bedtime.   metFORMIN (GLUCOPHAGE)  1000 MG tablet Take 1,000 mg by mouth 2 (two) times daily with a meal.   metoprolol tartrate (LOPRESSOR) 50 MG tablet Take 1 tablet (50 mg total) by mouth 2 (two) times daily.   simvastatin (ZOCOR) 40 MG tablet Take 40 mg by mouth every evening.   warfarin (COUMADIN) 5 MG tablet Take 1 tablet (5 mg total) by mouth See admin instructions. Take 5 mg by mouth daily except for Saturdays ---Restart Coumadin on Wednesday 06/15/2018     Allergies:   Flomax [tamsulosin hcl]   Social History   Socioeconomic History   Marital status: Married    Spouse name: Not on file   Number of children: Not on file   Years of education: Not on file   Highest education level: Not on file  Occupational History   Not on file  Tobacco Use   Smoking status: Former    Types: Cigarettes    Quit date: 12/29/1965    Years since quitting: 55.7   Smokeless tobacco: Never  Vaping Use   Vaping Use: Never used  Substance and Sexual Activity   Alcohol use: No    Alcohol/week: 0.0 standard drinks of alcohol   Drug use: No   Sexual activity: Not on file  Other Topics Concern   Not on file  Social History Narrative   Not on file   Social Determinants of Health   Financial Resource Strain: Not on file  Food Insecurity: Not on file  Transportation Needs: Not on file  Physical Activity: Not on file  Stress: Not on file  Social Connections: Not on file     Family History: The patient's family history includes Aneurysm in his father; CVA in his mother; Heart attack in his maternal aunt; Heart disease in his brother, brother, and maternal aunt.  ROS:   Please see the history of present illness.    + DOE + mild bilateral LE edema + lightheadedness + bendopnea All other systems reviewed and are negative.  Labs/Other Studies Reviewed:    The following studies were reviewed today:  Echo 07/10/2016  - Left ventricle: The cavity size was mildly dilated. Systolic    function was normal. The estimated ejection  fraction was in the    range of 55% to 60%. Wall motion was normal; there were no    regional wall motion abnormalities.  - Left atrium: The atrium was severely dilated.  - Right ventricle: The cavity size was mildly dilated. Wall    thickness was normal.  - Right atrium: The atrium was severely dilated.  - Atrial septum: No defect or patent foramen ovale was identified.  - Pulmonary arteries: PA peak pressure: 32 mm Hg (S).   Lexiscan Myoview 06/27/2016  The left ventricular ejection fraction is normal (55-65%). Nuclear  stress EF: 56%. No T wave inversion was noted during stress. There was no ST segment deviation noted during stress. Defect 1: There is a small defect of mild severity. This is a low risk study.   Small size, mild intensity fixed apical/apical lateral perfusion defect, likely artifact. No reversible ischemia. LVEF 56% with normal wall motion. This is a low risk study.   Cardiac cath 07/24/2014  1st RPLB lesion, 90% stenosed. Prox RCA to Dist RCA lesion, 30% stenosed. Ost RCA lesion, 40% stenosed. Dist Cx lesion, 70% stenosed. Prox Cx to Dist Cx lesion, 50% stenosed. Prox LAD to Mid LAD lesion, 55% stenosed. Ost LAD to Prox LAD lesion, 55% stenosed.   90% third left ventricular branch of the right coronary artery and 50-70% obtuse marginal. Proximal LAD contains eccentric 55% narrowing Normal left ventricular systolic function with normal hemodynamics No evidence of pulmonary hypertension     Recommendations:  Because of significant desaturation after sedation it is felt that a sleep study is indicated and may explain the patient's fatigue and dyspnea.    Recent Labs: From [facility] 09/08/21 HDL 44, LDL 64, Trigs 153 A1C 7.6 Hgb 14.2, PLT 126 Creatinine 0.980 K+ 4.3 ALT 15, TSH 2.250  Risk Assessment/Calculations:    CHA2DS2-VASc Score = 5  This indicates a 7.2% annual risk of stroke. The patient's score is based upon: CHF History: 0 HTN History:  1 Diabetes History: 0 Stroke History: 2 Vascular Disease History: 0 Age Score: 2 Gender Score: 0    Physical Exam:    VS:  BP (!) 140/80   Pulse 71   Ht 6\' 2"  (1.88 m)   Wt 228 lb 9.6 oz (103.7 kg)   SpO2 96%   BMI 29.35 kg/m     Wt Readings from Last 3 Encounters:  09/26/21 228 lb 9.6 oz (103.7 kg)  02/21/19 235 lb (106.6 kg)  06/10/18 218 lb 11.1 oz (99.2 kg)    HYPERTENSION CONTROL Vitals:   09/26/21 0829 09/26/21 1001  BP: (!) 128/90 (!) 140/80    The patient's blood pressure is elevated above target today.  In order to address the patient's elevated BP: Blood pressure will be monitored at home to determine if medication changes need to be made.     GEN:  Well nourished, well developed in no acute distress HEENT: Normal NECK: No JVD; No carotid bruits CARDIAC: Irregular RR, no murmurs, rubs, gallops RESPIRATORY:  Clear to auscultation without rales, wheezing or rhonchi  ABDOMEN: Soft, non-tender, obese MUSCULOSKELETAL:  Mild bilateral non-pitting LE edema; No deformity. 2+ pedal pulses, equal bilaterally SKIN: Warm and dry NEUROLOGIC:  Alert and oriented x 3 PSYCHIATRIC:  Normal affect   EKG:  EKG is ordered today.  The ekg ordered today demonstrates atrial fibrillation with well-controlled ventricular rate at 71 bpm, nonspecific ST/T abnormality, no acute change from previous  Diagnoses:    1. DOE (dyspnea on exertion)   2. Coronary artery disease involving native coronary artery of native heart with angina pectoris ([place])   3. Chronic atrial fibrillation ([place])   4. Chronic anticoagulation   5. Essential hypertension   6. Hyperlipidemia LDL goal <70   7. Dyspnea when bending forward    Assessment and Plan:     CAD with ?angina: 90% third left ventricular branch of RCA and 50 to 70% stenosis obtuse marginal, distal Cx lesion 70%, proximal LAD contains eccentric 55% narrowing on cath 07/2014. He denies chest pain. Having DOE and bendopnea. Question if this  is angina equivalent. We will refill his nitroglycerin and I have asked him to keep this on hand and use for worsening DOE. We will get lexiscan myoview for evaluation of ischemia. Will get echo for structural heart disease. Continue metoprolol, simvastatin, amlodipine.   DOE/Bendopnea: Dyspnea on exertion x1 year. Mild LE edema, no orthopnea or PND. Has not progressively worsened but no improvement. As noted above, could be angina equivalent. He had mild to moderate CAD on cath 2016. Does not exercise on a consistent basis, just does work around his home. We will update echocardiogram for evaluation of heart function.   Chronic atrial fibrillation on chronic anticoagulation: A-fib on EKG with well-controlled ventricular rate. He denies symptoms. No bleeding concerns on Coumadin. Continue rate control and anticoagulation. Continue metoprolol, Coumadin.   Hypertension: BP is elevated on initial check as well as by my recheck.  He reports a normal home BP reading.  We will continue to monitor due to lightheadedness and presyncope.  Hyperlipidemia LDL goal < 70: LDL 64 on 09/08/21.  Continue simvastatin.  Disposition: 3 months with Dr. Tamala Julian  Shared Decision Making/Informed Consent The risks [chest pain, shortness of breath, cardiac arrhythmias, dizziness, blood pressure fluctuations, myocardial infarction, stroke/transient ischemic attack, nausea, vomiting, allergic reaction, radiation exposure, metallic taste sensation and life-threatening complications (estimated to be 1 in 10,000)], benefits (risk stratification, diagnosing coronary artery disease, treatment guidance) and alternatives of a nuclear stress test were discussed in detail with Mr. Triner and he agrees to proceed.    Medication Adjustments/Labs and Tests Ordered: Current medicines are reviewed at length with the patient today.  Concerns regarding medicines are outlined above.  Orders Placed This Encounter  Procedures   Cardiac Stress  Test: Informed Consent Details: Physician/Practitioner Attestation; Transcribe to consent form and obtain patient signature   Myocardial Perfusion Imaging   EKG 12-Lead   ECHOCARDIOGRAM COMPLETE   Meds ordered this encounter  Medications   nitroGLYCERIN (NITROSTAT) 0.4 MG SL tablet    Sig: Place 1 tablet (0.4 mg total) under the tongue every 5 (five) minutes as needed for chest pain.    Dispense:  25 tablet    Refill:  6    Order Specific Question:   Supervising Provider    Answer:   Thayer Headings [phone]    Patient Instructions  Medication Instructions:   Your physician recommends that you continue on your current medications as directed. Please refer to the Current Medication list given to you today.   *If you need a refill on your cardiac medications before your next appointment, please call your pharmacy*   Lab Work:  None ordered.  If you have labs (blood work) drawn today and your tests are completely normal, you will receive your results only by: [place] (if you have MyChart) OR A paper copy in the mail If you have any lab test that is abnormal or we need to change your treatment, we will call you to review the results.   Testing/Procedures:  Your physician has requested that you have an echocardiogram. Echocardiography is a painless test that uses sound waves to create images of your heart. It provides your doctor with information about the size and shape of your heart and how well your heart's chambers and valves are working. This procedure takes approximately one hour. There are no restrictions for this procedure.  You are scheduled for a Myocardial Perfusion Imaging Study on Thursday, August 31 at 10:00 am.   Please arrive 15 minutes prior to  your appointment time for registration and insurance purposes.   The test will take approximately 3 to 4 hours to complete; you may bring reading material. If someone comes with you to your appointment, they will  need to remain in the main lobby due to limited space in the testing area.   How to prepare for your Myocardial Perfusion test:   Do not eat or drink 3 hours prior to your test, except you may have water.    Do not consume products containing caffeine (regular or decaffeinated) 12 hours prior to your test (ex: coffee, chocolate, soda, tea)   Do bring a list of your current medications with you. If not listed below, you may take your medications as normal.   Bring any held medication to your appointment, as you may be required to take it once the test is complete.   Do wear comfortable clothes (no overalls) and walking shoes. Tennis shoes are preferred. No open toed shoes.  Do not wear cologne, aftershave or lotions (deodorant is allowed).   If these instructions are not followed, you test will have to be rescheduled.   Please report to [address] for your test. If you have questions or concerns about your appointment, please call the Nuclear Lab at #[phone].  If you cannot keep your appointment, please provide 24 hour notification to the Nuclear lab to avoid a possible $50 charge to your account.       Follow-Up: At [facility], you and your health needs are our priority.  As part of our continuing mission to provide you with exceptional heart care, we have created designated Provider Care Teams.  These Care Teams include your primary Cardiologist (physician) and Advanced Practice Providers (APPs -  Physician Assistants and Nurse Practitioners) who all work together to provide you with the care you need, when you need it.  We recommend signing up for the patient portal called "MyChart".  Sign up information is provided on this After Visit Summary.  MyChart is used to connect with patients for Virtual Visits (Telemedicine).  Patients are able to view lab/test results, encounter notes, upcoming appointments, etc.  Non-urgent messages can be sent to your  provider as well.   To learn more about what you can do with MyChart, go to [email or web address].    Your next appointment:   3 month(s)  The format for your next appointment:   In Person  Provider:   Lesleigh Noe, MD    Important Information About Sugar         Signed, Levi Aland, NP  09/26/2021 10:12 AM    [facility]

## 2021-09-26 ENCOUNTER — Ambulatory Visit: Payer: Medicare Other | Admitting: Nurse Practitioner

## 2021-09-26 ENCOUNTER — Encounter: Payer: Self-pay | Admitting: Nurse Practitioner

## 2021-09-26 VITALS — BP 140/80 | HR 71 | Ht 74.0 in | Wt 228.6 lb

## 2021-09-26 DIAGNOSIS — E785 Hyperlipidemia, unspecified: Secondary | ICD-10-CM

## 2021-09-26 DIAGNOSIS — Z7901 Long term (current) use of anticoagulants: Secondary | ICD-10-CM

## 2021-09-26 DIAGNOSIS — R0609 Other forms of dyspnea: Secondary | ICD-10-CM

## 2021-09-26 DIAGNOSIS — I482 Chronic atrial fibrillation, unspecified: Secondary | ICD-10-CM

## 2021-09-26 DIAGNOSIS — R0600 Dyspnea, unspecified: Secondary | ICD-10-CM | POA: Diagnosis not present

## 2021-09-26 DIAGNOSIS — I10 Essential (primary) hypertension: Secondary | ICD-10-CM

## 2021-09-26 DIAGNOSIS — I25119 Atherosclerotic heart disease of native coronary artery with unspecified angina pectoris: Secondary | ICD-10-CM

## 2021-09-26 MED ORDER — NITROGLYCERIN 0.4 MG SL SUBL: 0.4000 mg | SUBLINGUAL_TABLET | SUBLINGUAL | 6 refills | Status: DC | PRN

## 2021-09-26 NOTE — Patient Instructions (Signed)
Medication Instructions:   Your physician recommends that you continue on your current medications as directed. Please refer to the Current Medication list given to you today.   *If you need a refill on your cardiac medications before your next appointment, please call your pharmacy*   Lab Work:  None ordered.  If you have labs (blood work) drawn today and your tests are completely normal, you will receive your results only by: MyChart Message (if you have MyChart) OR A paper copy in the mail If you have any lab test that is abnormal or we need to change your treatment, we will call you to review the results.   Testing/Procedures:  Your physician has requested that you have an echocardiogram. Echocardiography is a painless test that uses sound waves to create images of your heart. It provides your doctor with information about the size and shape of your heart and how well your heart's chambers and valves are working. This procedure takes approximately one hour. There are no restrictions for this procedure.  You are scheduled for a Myocardial Perfusion Imaging Study on Thursday, August 31 at 10:00 am.   Please arrive 15 minutes prior to your appointment time for registration and insurance purposes.   The test will take approximately 3 to 4 hours to complete; you may bring reading material. If someone comes with you to your appointment, they will need to remain in the main lobby due to limited space in the testing area.   How to prepare for your Myocardial Perfusion test:   Do not eat or drink 3 hours prior to your test, except you may have water.    Do not consume products containing caffeine (regular or decaffeinated) 12 hours prior to your test (ex: coffee, chocolate, soda, tea)   Do bring a list of your current medications with you. If not listed below, you may take your medications as normal.   Bring any held medication to your appointment, as you may be required to take it  once the test is complete.   Do wear comfortable clothes (no overalls) and walking shoes. Tennis shoes are preferred. No open toed shoes.  Do not wear cologne, aftershave or lotions (deodorant is allowed).   If these instructions are not followed, you test will have to be rescheduled.   Please report to [address] for your test. If you have questions or concerns about your appointment, please call the Nuclear Lab at #[phone].  If you cannot keep your appointment, please provide 24 hour notification to the Nuclear lab to avoid a possible $50 charge to your account.       Follow-Up: At [facility], you and your health needs are our priority.  As part of our continuing mission to provide you with exceptional heart care, we have created designated Provider Care Teams.  These Care Teams include your primary Cardiologist (physician) and Advanced Practice Providers (APPs -  Physician Assistants and Nurse Practitioners) who all work together to provide you with the care you need, when you need it.  We recommend signing up for the patient portal called "MyChart".  Sign up information is provided on this After Visit Summary.  MyChart is used to connect with patients for Virtual Visits (Telemedicine).  Patients are able to view lab/test results, encounter notes, upcoming appointments, etc.  Non-urgent messages can be sent to your provider as well.   To learn more about what you can do with MyChart, go to [email or web address].  Your next appointment:   3 month(s)  The format for your next appointment:   In Person  Provider:   Lesleigh Noe, MD    Important Information About Sugar

## 2021-09-26 NOTE — Progress Notes (Signed)
He is experiencing lightheadedness and presyncope

## 2021-10-06 ENCOUNTER — Telehealth (HOSPITAL_COMMUNITY): Payer: Self-pay

## 2021-10-06 DIAGNOSIS — Z7901 Long term (current) use of anticoagulants: Secondary | ICD-10-CM | POA: Diagnosis not present

## 2021-10-06 DIAGNOSIS — E538 Deficiency of other specified B group vitamins: Secondary | ICD-10-CM | POA: Diagnosis not present

## 2021-10-06 NOTE — Telephone Encounter (Signed)
Spoke with the patient, detailed instructions given. Asked to call back with any questions. S.Evanell Redlich EMTP 

## 2021-10-09 ENCOUNTER — Ambulatory Visit (HOSPITAL_BASED_OUTPATIENT_CLINIC_OR_DEPARTMENT_OTHER): Payer: Medicare Other

## 2021-10-09 ENCOUNTER — Ambulatory Visit (HOSPITAL_COMMUNITY): Payer: Medicare Other | Attending: Internal Medicine

## 2021-10-09 DIAGNOSIS — I25119 Atherosclerotic heart disease of native coronary artery with unspecified angina pectoris: Secondary | ICD-10-CM | POA: Insufficient documentation

## 2021-10-09 DIAGNOSIS — R0600 Dyspnea, unspecified: Secondary | ICD-10-CM | POA: Insufficient documentation

## 2021-10-09 DIAGNOSIS — R0609 Other forms of dyspnea: Secondary | ICD-10-CM | POA: Diagnosis not present

## 2021-10-09 DIAGNOSIS — I482 Chronic atrial fibrillation, unspecified: Secondary | ICD-10-CM | POA: Diagnosis not present

## 2021-10-09 LAB — MYOCARDIAL PERFUSION IMAGING
LV dias vol: 85 mL (ref 62–150)
LV sys vol: 44 mL
Nuc Stress EF: 48 %
Peak HR: 117 {beats}/min
Rest HR: 68 {beats}/min
Rest Nuclear Isotope Dose: 10.8 mCi
SDS: 0
SRS: 0
SSS: 0
ST Depression (mm): 0 mm
Stress Nuclear Isotope Dose: 31.6 mCi
TID: 1.09

## 2021-10-09 LAB — ECHOCARDIOGRAM COMPLETE: S' Lateral: 3.4 cm

## 2021-10-09 MED ORDER — TECHNETIUM TC 99M TETROFOSMIN IV KIT
31.6000 | PACK | Freq: Once | INTRAVENOUS | Status: AC | PRN
  Administered 2021-10-09: 31.6 via INTRAVENOUS

## 2021-10-09 MED ORDER — REGADENOSON 0.4 MG/5ML IV SOLN
0.4000 mg | Freq: Once | INTRAVENOUS | Status: AC
  Administered 2021-10-09: 0.4 mg via INTRAVENOUS

## 2021-10-09 MED ORDER — PERFLUTREN LIPID MICROSPHERE
1.0000 mL | INTRAVENOUS | Status: AC | PRN
  Administered 2021-10-09: 2 mL via INTRAVENOUS
  Administered 2021-10-09: 3 mL via INTRAVENOUS

## 2021-10-09 MED ORDER — TECHNETIUM TC 99M TETROFOSMIN IV KIT
10.8000 | PACK | Freq: Once | INTRAVENOUS | Status: AC | PRN
  Administered 2021-10-09: 10.8 via INTRAVENOUS

## 2021-10-14 DIAGNOSIS — Z7901 Long term (current) use of anticoagulants: Secondary | ICD-10-CM | POA: Diagnosis not present

## 2021-10-21 DIAGNOSIS — Z7901 Long term (current) use of anticoagulants: Secondary | ICD-10-CM | POA: Diagnosis not present

## 2021-11-12 DIAGNOSIS — E538 Deficiency of other specified B group vitamins: Secondary | ICD-10-CM | POA: Diagnosis not present

## 2021-11-12 DIAGNOSIS — Z7901 Long term (current) use of anticoagulants: Secondary | ICD-10-CM | POA: Diagnosis not present

## 2021-11-12 DIAGNOSIS — Z23 Encounter for immunization: Secondary | ICD-10-CM | POA: Diagnosis not present

## 2021-12-10 DIAGNOSIS — E538 Deficiency of other specified B group vitamins: Secondary | ICD-10-CM | POA: Diagnosis not present

## 2021-12-10 DIAGNOSIS — Z7901 Long term (current) use of anticoagulants: Secondary | ICD-10-CM | POA: Diagnosis not present

## 2021-12-13 NOTE — Progress Notes (Unsigned)
Cardiology Office Note:    Date:  12/15/2021   ID:  Eric Gallagher, DOB [birth year], MRN [id number]  PCP:  Georgann Housekeeper, MD  Cardiologist:  Lesleigh Noe, MD   Referring MD: Georgann Housekeeper, MD   Chief Complaint  Patient presents with   Atrial Fibrillation   Shortness of Breath   Follow-up    Abnormal balance Gait disturbance    History of Present Illness:    Eric Gallagher is a 81 y.o. male with a hx of a hx of CAD s/p DES to LAD 2005, chronic atrial fibrillation, HTN, type 2 diabetes, chronic thrombocytopenia, DOE, and fatigue.   He is doing well with 1 exception, dizziness.  He describes the dizziness as a sensation of spinning.  It happens most often when he goes from sitting to standing or if he bends over.  States that everything starts to spin around.  He is also having difficulty with gait but has not had a significant fall.  He is losing his ability to care for himself and do things because of the gait abnormality.  He has started walking with a cane.  He denies orthopnea, PND, chest pain, and bleeding.  No falls or head injury.  He has chronic shortness of breath.  Unable to assess diastolic function on echo because of A-fib.  Past Medical History:  Diagnosis Date   Anginal pain (HCC)    h/o    Arrhythmia    Chronic AFIB, no anti-coag patient desire and low platelet count   Arthritis    both hips    BPH (benign prostatic hyperplasia)    Chronic a-fib (HCC)    Coronary artery disease    NSTEMI in 2005, Taxus DES LAD   Diabetes mellitus without complication (HCC)    type II, some neuropathy   Dysrhythmia    afib   ED (erectile dysfunction)    History of kidney stones 2012   passed spontaneously   Hyperlipidemia    Hypertension    Old MI (myocardial infarction)    Stroke (HCC) 2017   Thrombocytopenia (HCC)    chronic    Past Surgical History:  Procedure Laterality Date   APPENDECTOMY     as a teen   CARDIAC CATHETERIZATION N/A 07/24/2014    Procedure: Right/Left Heart Cath and Coronary Angiography;  Surgeon: Lyn Records, MD;  Location: [facility];  Service: Cardiovascular;  Laterality: N/A;   EYE SURGERY Left 2016   retinal surgery, cataracts removed- both eyes   HERNIA REPAIR  1998   L inguinal    I & D EXTREMITY Right 06/18/2014   Procedure: OPEN TREATMENT RIGHT THUMB, INCOMPLETE AMPUTATION.  Washing and dressing applied to left thumb.;  Surgeon: Mack Hook, MD;  Location: [facility];  Service: Orthopedics;  Laterality: Right;   TOTAL HIP ARTHROPLASTY Right 01/04/2018   Procedure: RIGHT TOTAL HIP ARTHROPLASTY ANTERIOR APPROACH;  Surgeon: Kathryne Hitch, MD;  Location: [facility] OR;  Service: Orthopedics;  Laterality: Right;   TOTAL HIP ARTHROPLASTY Left 03/01/2018   Procedure: LEFT TOTAL HIP ARTHROPLASTY ANTERIOR APPROACH;  Surgeon: Kathryne Hitch, MD;  Location: [facility] OR;  Service: Orthopedics;  Laterality: Left;    Current Medications: Current Meds  Medication Sig   ACCU-CHEK AVIVA PLUS test strip Use as directed weekly.   ACCU-CHEK SOFTCLIX LANCETS lancets Use as directed weekly.   alfuzosin (UROXATRAL) 10 MG 24 hr tablet Take 10 mg by mouth at bedtime.   amLODipine (NORVASC) 5  MG tablet Take 5 mg by mouth daily.   benazepril (LOTENSIN) 20 MG tablet Take 20 mg by mouth daily.   finasteride (PROSCAR) 5 MG tablet Take 5 mg by mouth daily.   glipiZIDE (GLUCOTROL) 5 MG tablet Take 2.5 mg by mouth at bedtime.   metFORMIN (GLUCOPHAGE) 1000 MG tablet Take 1,000 mg by mouth 2 (two) times daily with a meal.   metoprolol tartrate (LOPRESSOR) 50 MG tablet Take 1 tablet (50 mg total) by mouth 2 (two) times daily.   nitroGLYCERIN (NITROSTAT) 0.4 MG SL tablet Place 1 tablet (0.4 mg total) under the tongue every 5 (five) minutes as needed for chest pain.   simvastatin (ZOCOR) 40 MG tablet Take 40 mg by mouth every evening.   warfarin (COUMADIN) 5 MG tablet Take 1 tablet (5 mg total) by mouth See admin instructions. Take 5  mg by mouth daily except for Saturdays ---Restart Coumadin on Wednesday 06/15/2018     Allergies:   Flomax [tamsulosin hcl]   Social History   Socioeconomic History   Marital status: Married    Spouse name: Not on file   Number of children: Not on file   Years of education: Not on file   Highest education level: Not on file  Occupational History   Not on file  Tobacco Use   Smoking status: Former    Types: Cigarettes    Quit date: 12/29/1965    Years since quitting: 56.0   Smokeless tobacco: Never  Vaping Use   Vaping Use: Never used  Substance and Sexual Activity   Alcohol use: No    Alcohol/week: 0.0 standard drinks of alcohol   Drug use: No   Sexual activity: Not on file  Other Topics Concern   Not on file  Social History Narrative   Not on file   Social Determinants of Health   Financial Resource Strain: Not on file  Food Insecurity: Not on file  Transportation Needs: Not on file  Physical Activity: Not on file  Stress: Not on file  Social Connections: Not on file     Family History: The patient's family history includes Aneurysm in his father; CVA in his mother; Heart attack in his maternal aunt; Heart disease in his brother, brother, and maternal aunt.  ROS:   Please see the history of present illness.    No chest pain.  His feet feel numb.  Shortness of breath continues to be a problem.  All other systems reviewed and are negative.  EKGs/Labs/Other Studies Reviewed:    The following studies were reviewed today:  2 D Doppler ECHOCARDIOGRAM 2023: IMPRESSIONS   1. Left ventricular ejection fraction, by estimation, is 55 to 60%. Left  ventricular ejection fraction by PLAX is 56 %. The left ventricle has  normal function. The left ventricle has no regional wall motion  abnormalities. There is mild left ventricular  hypertrophy. Left ventricular diastolic function could not be evaluated.   2. Right ventricular systolic function is normal. The right  ventricular  size is normal.   3. Left atrial size was mildly dilated.   4. Right atrial size was moderately dilated.   5. The mitral valve is abnormal. Trivial mitral valve regurgitation.   6. The aortic valve is tricuspid. Aortic valve regurgitation is not  visualized.   7. Aortic dilatation noted. 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   8. Rhythm strip during this exam demonstrates atrial fibrillation.   Comparison(s): Changes from prior  study are noted. 07/10/2016: LVEF 55-60%,  severe LAE, severe RAE.    EKG:  EKG not repeated.  Recent Labs: No results found for requested labs within last 365 days.  Recent Lipid Panel No results found for: "CHOL", "TRIG", "HDL", "CHOLHDL", "VLDL", "LDLCALC", "LDLDIRECT"  Physical Exam:    VS:  BP 134/86   Pulse 78   Ht 6\' 2"  (1.88 m)   Wt 229 lb 9.6 oz (104.1 kg)   SpO2 98%   BMI 29.48 kg/m     Wt Readings from Last 3 Encounters:  12/15/21 229 lb 9.6 oz (104.1 kg)  09/26/21 228 lb 9.6 oz (103.7 kg)  02/21/19 235 lb (106.6 kg)     GEN: Elderly. No acute distress HEENT: Normal NECK: No JVD. LYMPHATICS: No lymphadenopathy CARDIAC: No murmur. IIRR no gallop, or edema. VASCULAR:  Normal Pulses. No bruits. RESPIRATORY:  Clear to auscultation without rales, wheezing or rhonchi  ABDOMEN: Soft, non-tender, non-distended, No pulsatile mass, MUSCULOSKELETAL: No deformity  SKIN: Warm and dry NEUROLOGIC:  Alert and oriented x 3 PSYCHIATRIC:  Normal affect   ASSESSMENT:    1. Coronary artery disease involving native coronary artery of native heart with angina pectoris (HCC)   2. Chronic atrial fibrillation (HCC)   3. Chronic anticoagulation   4. Essential hypertension   5. Hyperlipidemia LDL goal <70   6. Dizziness   7. Abnormality of gait due to impairment of balance   8. Shortness of breath    PLAN:    In order of problems listed above:  He is stable and not having angina Controlled  rate. Coumadin therapy without complications although gait impairment and vertiginous dizziness is a risk for fall and head trauma. Excellent blood pressure control without orthostasis.  Continue metoprolol 50 mg twice a day, Lotensin 20 mg/day, amlodipine 5 mg/day. Continue simvastatin 40 mg/day. Need to exclude vestibular disease versus posterior circulation disease in this longstanding diabetic. Rotation to determine if there is a neurological explanation for the patient's impaired balance. Possibly related to diastolic dysfunction.  Consider trial of SGLT2.  Unable to document diastolic function because of underlying atrial fibs.  Have chosen not to institute SGLT2 until we work through the patient's gait imbalance and dizziness.   Overall education and awareness concerning primary/secondary risk prevention was discussed in detail: LDL less than 70, hemoglobin A1c less than 7, blood pressure target less than 130/80 mmHg, >150 minutes of moderate aerobic activity per week, avoidance of smoking, weight control (via diet and exercise), and continued surveillance/management of/for obstructive sleep apnea.    Medication Adjustments/Labs and Tests Ordered: Current medicines are reviewed at length with the patient today.  Concerns regarding medicines are outlined above.  Orders Placed This Encounter  Procedures   Ambulatory referral to Neurology   No orders of the defined types were placed in this encounter.   Patient Instructions  Medication Instructions:  Your physician recommends that you continue on your current medications as directed. Please refer to the Current Medication list given to you today.  *If you need a refill on your cardiac medications before your next appointment, please call your pharmacy*  Lab Work: NONE  Testing/Procedures: NONE  Follow-Up: At [facility], you and your health needs are our priority.  As part of our continuing mission to provide you with  exceptional heart care, we have created designated Provider Care Teams.  These Care Teams include your primary Cardiologist (physician) and Advanced Practice Providers (APPs -  Physician Assistants and Nurse  Practitioners) who all work together to provide you with the care you need, when you need it.  Your next appointment:   6 month(s)  The format for your next appointment:   In Person  Provider:   Lesleigh Noe, MD  or Eligha Bridegroom, NP      Other Instructions Your physician has referred you to [facility]. They are located at [address], [address], [place], [place] [address]. Their phone number is [phone]. Their office will call you to schedule an appointment to see a neurologist for gait impairment/vertigo.  Important Information About Sugar         Signed, Lesleigh Noe, MD  12/15/2021 11:44 AM    [facility]

## 2021-12-15 ENCOUNTER — Encounter: Payer: Self-pay | Admitting: Interventional Cardiology

## 2021-12-15 ENCOUNTER — Ambulatory Visit: Payer: Medicare Other | Attending: Interventional Cardiology | Admitting: Interventional Cardiology

## 2021-12-15 VITALS — BP 134/86 | HR 78 | Ht 74.0 in | Wt 229.6 lb

## 2021-12-15 DIAGNOSIS — Z7901 Long term (current) use of anticoagulants: Secondary | ICD-10-CM

## 2021-12-15 DIAGNOSIS — R0602 Shortness of breath: Secondary | ICD-10-CM

## 2021-12-15 DIAGNOSIS — R42 Dizziness and giddiness: Secondary | ICD-10-CM

## 2021-12-15 DIAGNOSIS — E785 Hyperlipidemia, unspecified: Secondary | ICD-10-CM | POA: Diagnosis not present

## 2021-12-15 DIAGNOSIS — R2689 Other abnormalities of gait and mobility: Secondary | ICD-10-CM

## 2021-12-15 DIAGNOSIS — I25119 Atherosclerotic heart disease of native coronary artery with unspecified angina pectoris: Secondary | ICD-10-CM | POA: Diagnosis not present

## 2021-12-15 DIAGNOSIS — I10 Essential (primary) hypertension: Secondary | ICD-10-CM | POA: Diagnosis not present

## 2021-12-15 DIAGNOSIS — I482 Chronic atrial fibrillation, unspecified: Secondary | ICD-10-CM

## 2021-12-15 NOTE — Patient Instructions (Addendum)
Medication Instructions:  Your physician recommends that you continue on your current medications as directed. Please refer to the Current Medication list given to you today.  *If you need a refill on your cardiac medications before your next appointment, please call your pharmacy*  Lab Work: NONE  Testing/Procedures: NONE  Follow-Up: At [facility], you and your health needs are our priority.  As part of our continuing mission to provide you with exceptional heart care, we have created designated Provider Care Teams.  These Care Teams include your primary Cardiologist (physician) and Advanced Practice Providers (APPs -  Physician Assistants and Nurse Practitioners) who all work together to provide you with the care you need, when you need it.  Your next appointment:   6 month(s)  The format for your next appointment:   In Person  Provider:   Sinclair Grooms, MD  or Christen Bame, NP      Other Instructions Your physician has referred you to [facility]. They are located at [address], [address], [place], [place] [address]. Their phone number is [phone]. Their office will call you to schedule an appointment to see a neurologist for gait impairment/vertigo.  Important Information About Sugar

## 2022-01-12 DIAGNOSIS — Z7901 Long term (current) use of anticoagulants: Secondary | ICD-10-CM | POA: Diagnosis not present

## 2022-01-12 DIAGNOSIS — E538 Deficiency of other specified B group vitamins: Secondary | ICD-10-CM | POA: Diagnosis not present

## 2022-01-28 DIAGNOSIS — Z961 Presence of intraocular lens: Secondary | ICD-10-CM | POA: Diagnosis not present

## 2022-01-28 DIAGNOSIS — H35373 Puckering of macula, bilateral: Secondary | ICD-10-CM | POA: Diagnosis not present

## 2022-01-28 DIAGNOSIS — H26491 Other secondary cataract, right eye: Secondary | ICD-10-CM | POA: Diagnosis not present

## 2022-01-28 DIAGNOSIS — H5319 Other subjective visual disturbances: Secondary | ICD-10-CM | POA: Diagnosis not present

## 2022-01-28 DIAGNOSIS — E113292 Type 2 diabetes mellitus with mild nonproliferative diabetic retinopathy without macular edema, left eye: Secondary | ICD-10-CM | POA: Diagnosis not present

## 2022-02-12 DIAGNOSIS — Z7901 Long term (current) use of anticoagulants: Secondary | ICD-10-CM | POA: Diagnosis not present

## 2022-02-12 DIAGNOSIS — E538 Deficiency of other specified B group vitamins: Secondary | ICD-10-CM | POA: Diagnosis not present

## 2022-02-25 DIAGNOSIS — L820 Inflamed seborrheic keratosis: Secondary | ICD-10-CM | POA: Diagnosis not present

## 2022-02-25 DIAGNOSIS — L578 Other skin changes due to chronic exposure to nonionizing radiation: Secondary | ICD-10-CM | POA: Diagnosis not present

## 2022-02-25 DIAGNOSIS — L814 Other melanin hyperpigmentation: Secondary | ICD-10-CM | POA: Diagnosis not present

## 2022-02-25 DIAGNOSIS — L570 Actinic keratosis: Secondary | ICD-10-CM | POA: Diagnosis not present

## 2022-03-16 DIAGNOSIS — E538 Deficiency of other specified B group vitamins: Secondary | ICD-10-CM | POA: Diagnosis not present

## 2022-03-16 DIAGNOSIS — Z7901 Long term (current) use of anticoagulants: Secondary | ICD-10-CM | POA: Diagnosis not present

## 2022-04-06 DIAGNOSIS — Z043 Encounter for examination and observation following other accident: Secondary | ICD-10-CM | POA: Diagnosis not present

## 2022-04-06 DIAGNOSIS — I639 Cerebral infarction, unspecified: Secondary | ICD-10-CM | POA: Diagnosis not present

## 2022-04-06 DIAGNOSIS — M25462 Effusion, left knee: Secondary | ICD-10-CM | POA: Diagnosis not present

## 2022-04-06 DIAGNOSIS — M79641 Pain in right hand: Secondary | ICD-10-CM | POA: Diagnosis not present

## 2022-04-06 DIAGNOSIS — M25562 Pain in left knee: Secondary | ICD-10-CM | POA: Diagnosis not present

## 2022-04-06 DIAGNOSIS — W19XXXA Unspecified fall, initial encounter: Secondary | ICD-10-CM | POA: Diagnosis not present

## 2022-04-20 DIAGNOSIS — Z7901 Long term (current) use of anticoagulants: Secondary | ICD-10-CM | POA: Diagnosis not present

## 2022-04-20 DIAGNOSIS — E538 Deficiency of other specified B group vitamins: Secondary | ICD-10-CM | POA: Diagnosis not present

## 2022-05-05 DIAGNOSIS — M1712 Unilateral primary osteoarthritis, left knee: Secondary | ICD-10-CM | POA: Diagnosis not present

## 2022-05-21 DIAGNOSIS — Z7901 Long term (current) use of anticoagulants: Secondary | ICD-10-CM | POA: Diagnosis not present

## 2022-05-21 DIAGNOSIS — E538 Deficiency of other specified B group vitamins: Secondary | ICD-10-CM | POA: Diagnosis not present

## 2022-06-17 DIAGNOSIS — S0990XA Unspecified injury of head, initial encounter: Secondary | ICD-10-CM | POA: Diagnosis not present

## 2022-06-17 DIAGNOSIS — I639 Cerebral infarction, unspecified: Secondary | ICD-10-CM | POA: Diagnosis not present

## 2022-06-17 DIAGNOSIS — R55 Syncope and collapse: Secondary | ICD-10-CM | POA: Diagnosis not present

## 2022-06-17 DIAGNOSIS — K5732 Diverticulitis of large intestine without perforation or abscess without bleeding: Secondary | ICD-10-CM | POA: Diagnosis not present

## 2022-06-17 DIAGNOSIS — I4891 Unspecified atrial fibrillation: Secondary | ICD-10-CM | POA: Diagnosis not present

## 2022-06-17 DIAGNOSIS — K760 Fatty (change of) liver, not elsewhere classified: Secondary | ICD-10-CM | POA: Diagnosis not present

## 2022-06-17 DIAGNOSIS — E785 Hyperlipidemia, unspecified: Secondary | ICD-10-CM | POA: Diagnosis not present

## 2022-06-17 DIAGNOSIS — R410 Disorientation, unspecified: Secondary | ICD-10-CM | POA: Diagnosis not present

## 2022-06-17 DIAGNOSIS — Z743 Need for continuous supervision: Secondary | ICD-10-CM | POA: Diagnosis not present

## 2022-06-17 DIAGNOSIS — I10 Essential (primary) hypertension: Secondary | ICD-10-CM | POA: Diagnosis not present

## 2022-06-17 DIAGNOSIS — W19XXXA Unspecified fall, initial encounter: Secondary | ICD-10-CM | POA: Diagnosis not present

## 2022-06-17 DIAGNOSIS — K5792 Diverticulitis of intestine, part unspecified, without perforation or abscess without bleeding: Secondary | ICD-10-CM | POA: Diagnosis not present

## 2022-06-17 DIAGNOSIS — R404 Transient alteration of awareness: Secondary | ICD-10-CM | POA: Diagnosis not present

## 2022-06-17 DIAGNOSIS — R9431 Abnormal electrocardiogram [ECG] [EKG]: Secondary | ICD-10-CM | POA: Diagnosis not present

## 2022-06-17 DIAGNOSIS — R791 Abnormal coagulation profile: Secondary | ICD-10-CM | POA: Diagnosis not present

## 2022-06-17 DIAGNOSIS — R079 Chest pain, unspecified: Secondary | ICD-10-CM | POA: Diagnosis not present

## 2022-06-17 DIAGNOSIS — N390 Urinary tract infection, site not specified: Secondary | ICD-10-CM | POA: Diagnosis not present

## 2022-06-17 DIAGNOSIS — E860 Dehydration: Secondary | ICD-10-CM | POA: Diagnosis not present

## 2022-06-17 DIAGNOSIS — R109 Unspecified abdominal pain: Secondary | ICD-10-CM | POA: Diagnosis not present

## 2022-06-17 DIAGNOSIS — E119 Type 2 diabetes mellitus without complications: Secondary | ICD-10-CM | POA: Diagnosis not present

## 2022-06-18 DIAGNOSIS — I4821 Permanent atrial fibrillation: Secondary | ICD-10-CM | POA: Diagnosis not present

## 2022-06-18 DIAGNOSIS — E1140 Type 2 diabetes mellitus with diabetic neuropathy, unspecified: Secondary | ICD-10-CM | POA: Diagnosis not present

## 2022-06-18 DIAGNOSIS — R42 Dizziness and giddiness: Secondary | ICD-10-CM | POA: Diagnosis not present

## 2022-06-18 DIAGNOSIS — K5792 Diverticulitis of intestine, part unspecified, without perforation or abscess without bleeding: Secondary | ICD-10-CM | POA: Diagnosis not present

## 2022-06-22 DIAGNOSIS — E538 Deficiency of other specified B group vitamins: Secondary | ICD-10-CM | POA: Diagnosis not present

## 2022-06-22 DIAGNOSIS — Z7901 Long term (current) use of anticoagulants: Secondary | ICD-10-CM | POA: Diagnosis not present

## 2022-06-26 DIAGNOSIS — E1140 Type 2 diabetes mellitus with diabetic neuropathy, unspecified: Secondary | ICD-10-CM | POA: Diagnosis not present

## 2022-06-26 DIAGNOSIS — I4821 Permanent atrial fibrillation: Secondary | ICD-10-CM | POA: Diagnosis not present

## 2022-06-26 DIAGNOSIS — Z7901 Long term (current) use of anticoagulants: Secondary | ICD-10-CM | POA: Diagnosis not present

## 2022-06-26 DIAGNOSIS — I69354 Hemiplegia and hemiparesis following cerebral infarction affecting left non-dominant side: Secondary | ICD-10-CM | POA: Diagnosis not present

## 2022-06-26 DIAGNOSIS — D6869 Other thrombophilia: Secondary | ICD-10-CM | POA: Diagnosis not present

## 2022-07-05 DIAGNOSIS — R1013 Epigastric pain: Secondary | ICD-10-CM | POA: Diagnosis not present

## 2022-07-05 DIAGNOSIS — R079 Chest pain, unspecified: Secondary | ICD-10-CM | POA: Diagnosis not present

## 2022-07-05 DIAGNOSIS — R1310 Dysphagia, unspecified: Secondary | ICD-10-CM | POA: Diagnosis not present

## 2022-07-05 DIAGNOSIS — K222 Esophageal obstruction: Secondary | ICD-10-CM | POA: Diagnosis not present

## 2022-07-07 ENCOUNTER — Emergency Department (HOSPITAL_COMMUNITY)
Admission: EM | Admit: 2022-07-07 | Discharge: 2022-07-07 | Disposition: A | Discharge status: Home / Self Care | Payer: Medicare Other | Attending: Emergency Medicine | Admitting: Emergency Medicine

## 2022-07-07 ENCOUNTER — Emergency Department (HOSPITAL_COMMUNITY): Payer: Medicare Other

## 2022-07-07 ENCOUNTER — Other Ambulatory Visit: Payer: Self-pay

## 2022-07-07 ENCOUNTER — Encounter (HOSPITAL_COMMUNITY): Payer: Self-pay

## 2022-07-07 ENCOUNTER — Encounter (HOSPITAL_COMMUNITY)
Admission: EM | Disposition: A | Discharge status: Home / Self Care | Payer: Self-pay | Source: Home / Self Care | Attending: Emergency Medicine

## 2022-07-07 ENCOUNTER — Emergency Department (EMERGENCY_DEPARTMENT_HOSPITAL): Payer: Medicare Other | Admitting: Anesthesiology

## 2022-07-07 ENCOUNTER — Emergency Department (HOSPITAL_COMMUNITY): Payer: Medicare Other | Admitting: Anesthesiology

## 2022-07-07 DIAGNOSIS — I25119 Atherosclerotic heart disease of native coronary artery with unspecified angina pectoris: Secondary | ICD-10-CM

## 2022-07-07 DIAGNOSIS — I482 Chronic atrial fibrillation, unspecified: Secondary | ICD-10-CM | POA: Diagnosis not present

## 2022-07-07 DIAGNOSIS — K209 Esophagitis, unspecified without bleeding: Secondary | ICD-10-CM

## 2022-07-07 DIAGNOSIS — I10 Essential (primary) hypertension: Secondary | ICD-10-CM | POA: Diagnosis not present

## 2022-07-07 DIAGNOSIS — Z7984 Long term (current) use of oral hypoglycemic drugs: Secondary | ICD-10-CM | POA: Insufficient documentation

## 2022-07-07 DIAGNOSIS — I252 Old myocardial infarction: Secondary | ICD-10-CM | POA: Insufficient documentation

## 2022-07-07 DIAGNOSIS — K2950 Unspecified chronic gastritis without bleeding: Secondary | ICD-10-CM | POA: Insufficient documentation

## 2022-07-07 DIAGNOSIS — R1310 Dysphagia, unspecified: Secondary | ICD-10-CM | POA: Insufficient documentation

## 2022-07-07 DIAGNOSIS — E876 Hypokalemia: Secondary | ICD-10-CM | POA: Insufficient documentation

## 2022-07-07 DIAGNOSIS — E119 Type 2 diabetes mellitus without complications: Secondary | ICD-10-CM | POA: Insufficient documentation

## 2022-07-07 DIAGNOSIS — E785 Hyperlipidemia, unspecified: Secondary | ICD-10-CM | POA: Insufficient documentation

## 2022-07-07 DIAGNOSIS — K2210 Ulcer of esophagus without bleeding: Secondary | ICD-10-CM | POA: Diagnosis not present

## 2022-07-07 DIAGNOSIS — Z7901 Long term (current) use of anticoagulants: Secondary | ICD-10-CM | POA: Insufficient documentation

## 2022-07-07 DIAGNOSIS — I2510 Atherosclerotic heart disease of native coronary artery without angina pectoris: Secondary | ICD-10-CM | POA: Insufficient documentation

## 2022-07-07 DIAGNOSIS — Z79899 Other long term (current) drug therapy: Secondary | ICD-10-CM | POA: Diagnosis not present

## 2022-07-07 DIAGNOSIS — K2970 Gastritis, unspecified, without bleeding: Secondary | ICD-10-CM | POA: Diagnosis not present

## 2022-07-07 DIAGNOSIS — K2289 Other specified disease of esophagus: Secondary | ICD-10-CM

## 2022-07-07 DIAGNOSIS — Z794 Long term (current) use of insulin: Secondary | ICD-10-CM | POA: Diagnosis not present

## 2022-07-07 DIAGNOSIS — R072 Precordial pain: Secondary | ICD-10-CM | POA: Diagnosis present

## 2022-07-07 DIAGNOSIS — Z87891 Personal history of nicotine dependence: Secondary | ICD-10-CM | POA: Diagnosis not present

## 2022-07-07 DIAGNOSIS — I700 Atherosclerosis of aorta: Secondary | ICD-10-CM | POA: Diagnosis not present

## 2022-07-07 DIAGNOSIS — K208 Other esophagitis without bleeding: Secondary | ICD-10-CM

## 2022-07-07 DIAGNOSIS — R079 Chest pain, unspecified: Secondary | ICD-10-CM | POA: Diagnosis not present

## 2022-07-07 HISTORY — PX: ESOPHAGOGASTRODUODENOSCOPY (EGD) WITH PROPOFOL: SHX5813

## 2022-07-07 HISTORY — PX: BIOPSY: SHX5522

## 2022-07-07 LAB — CBC WITH DIFFERENTIAL/PLATELET
Abs Immature Granulocytes: 0.02 10*3/uL (ref 0.00–0.07)
Basophils Absolute: 0 10*3/uL (ref 0.0–0.1)
Basophils Relative: 0 %
Eosinophils Absolute: 0 10*3/uL (ref 0.0–0.5)
Eosinophils Relative: 0 %
HCT: 46 % (ref 39.0–52.0)
Hemoglobin: 15 g/dL (ref 13.0–17.0)
Immature Granulocytes: 0 %
Lymphocytes Relative: 9 %
Lymphs Abs: 0.7 10*3/uL (ref 0.7–4.0)
MCH: 31.1 pg (ref 26.0–34.0)
MCHC: 32.6 g/dL (ref 30.0–36.0)
MCV: 95.2 fL (ref 80.0–100.0)
Monocytes Absolute: 0.5 10*3/uL (ref 0.1–1.0)
Monocytes Relative: 7 %
Neutro Abs: 6.3 10*3/uL (ref 1.7–7.7)
Neutrophils Relative %: 84 %
Platelets: 139 10*3/uL — ABNORMAL LOW (ref 150–400)
RBC: 4.83 MIL/uL (ref 4.22–5.81)
RDW: 13.3 % (ref 11.5–15.5)
WBC: 7.6 10*3/uL (ref 4.0–10.5)
nRBC: 0 % (ref 0.0–0.2)

## 2022-07-07 LAB — PROTIME-INR
INR: 2 — ABNORMAL HIGH (ref 0.8–1.2)
Prothrombin Time: 23.3 seconds — ABNORMAL HIGH (ref 11.4–15.2)

## 2022-07-07 LAB — COMPREHENSIVE METABOLIC PANEL
ALT: 14 U/L (ref 0–44)
AST: 15 U/L (ref 15–41)
Albumin: 2.5 g/dL — ABNORMAL LOW (ref 3.5–5.0)
Alkaline Phosphatase: 38 U/L (ref 38–126)
Anion gap: 4 — ABNORMAL LOW (ref 5–15)
BUN: 10 mg/dL (ref 8–23)
CO2: 21 mmol/L — ABNORMAL LOW (ref 22–32)
Calcium: 6.8 mg/dL — ABNORMAL LOW (ref 8.9–10.3)
Chloride: 112 mmol/L — ABNORMAL HIGH (ref 98–111)
Creatinine, Ser: 0.7 mg/dL (ref 0.61–1.24)
GFR, Estimated: >60 mL/min (ref >60)
Glucose, Bld: 118 mg/dL — ABNORMAL HIGH (ref 70–99)
Potassium: 3.1 mmol/L — ABNORMAL LOW (ref 3.5–5.1)
Sodium: 137 mmol/L (ref 135–145)
Total Bilirubin: 0.7 mg/dL (ref 0.3–1.2)
Total Protein: 4.9 g/dL — ABNORMAL LOW (ref 6.5–8.1)

## 2022-07-07 LAB — TROPONIN I (HIGH SENSITIVITY)
Troponin I (High Sensitivity): 7 ng/L (ref <18)
Troponin I (High Sensitivity): 6 ng/L (ref <18)

## 2022-07-07 LAB — LIPASE, BLOOD: Lipase: 31 U/L (ref 11–51)

## 2022-07-07 SURGERY — ESOPHAGOGASTRODUODENOSCOPY (EGD) WITH PROPOFOL
Anesthesia: General

## 2022-07-07 MED ORDER — SUCRALFATE 1 G PO TABS: 1.0000 g | ORAL_TABLET | Freq: Two times a day (BID) | ORAL | 0 refills | Status: DC

## 2022-07-07 MED ORDER — POTASSIUM CHLORIDE 20 MEQ PO PACK
40.0000 meq | PACK | Freq: Once | ORAL | Status: AC
  Administered 2022-07-07: 40 meq via ORAL
  Filled 2022-07-07: qty 2

## 2022-07-07 MED ORDER — PANTOPRAZOLE SODIUM 40 MG PO TBEC: 40.0000 mg | DELAYED_RELEASE_TABLET | Freq: Two times a day (BID) | ORAL | 1 refills | Status: DC

## 2022-07-07 MED ORDER — DEXAMETHASONE SODIUM PHOSPHATE 10 MG/ML IJ SOLN
INTRAMUSCULAR | Status: DC | PRN
  Administered 2022-07-07: 5 mg via INTRAVENOUS

## 2022-07-07 MED ORDER — PANTOPRAZOLE SODIUM 40 MG PO TBEC
40.0000 mg | DELAYED_RELEASE_TABLET | Freq: Two times a day (BID) | ORAL | Status: DC
  Administered 2022-07-07: 40 mg via ORAL
  Filled 2022-07-07: qty 1

## 2022-07-07 MED ORDER — SODIUM CHLORIDE 0.9 % IV BOLUS
1000.0000 mL | Freq: Once | INTRAVENOUS | Status: AC
  Administered 2022-07-07: 1000 mL via INTRAVENOUS

## 2022-07-07 MED ORDER — LIDOCAINE 2% (20 MG/ML) 5 ML SYRINGE
INTRAMUSCULAR | Status: DC | PRN
  Administered 2022-07-07: 80 mg via INTRAVENOUS

## 2022-07-07 MED ORDER — SODIUM CHLORIDE 0.9 % IV SOLN: INTRAVENOUS | Status: DC

## 2022-07-07 MED ORDER — PROPOFOL 10 MG/ML IV BOLUS
INTRAVENOUS | Status: DC | PRN
  Administered 2022-07-07: 130 mg via INTRAVENOUS

## 2022-07-07 MED ORDER — LACTATED RINGERS IV SOLN: INTRAVENOUS | Status: DC

## 2022-07-07 MED ORDER — ONDANSETRON HCL 4 MG/2ML IJ SOLN
INTRAMUSCULAR | Status: DC | PRN
  Administered 2022-07-07: 4 mg via INTRAVENOUS

## 2022-07-07 MED ORDER — SUCCINYLCHOLINE CHLORIDE 200 MG/10ML IV SOSY
PREFILLED_SYRINGE | INTRAVENOUS | Status: DC | PRN
  Administered 2022-07-07: 140 mg via INTRAVENOUS

## 2022-07-07 SURGICAL SUPPLY — 15 items

## 2022-07-07 NOTE — Transfer of Care (Signed)
Immediate Anesthesia Transfer of Care Note  Patient: Eric Gallagher  Procedure(s) Performed: ESOPHAGOGASTRODUODENOSCOPY (EGD) WITH PROPOFOL BIOPSY  Patient Location: Endoscopy Unit  Anesthesia Type:General  Level of Consciousness: awake, alert , and oriented  Airway & Oxygen Therapy: Patient Spontanous Breathing  Post-op Assessment: Report given to RN and Post -op Vital signs reviewed and stable  Post vital signs: Reviewed and stable  Last Vitals:  Vitals Value Taken Time  BP 157/81 07/07/22 1450  Temp    Pulse 98 07/07/22 1451  Resp 18 07/07/22 1451  SpO2 92 % 07/07/22 1451  Vitals shown include unvalidated device data.  Last Pain:  Vitals:   07/07/22 1339  TempSrc: Temporal  PainSc: 6          Complications: No notable events documented.

## 2022-07-07 NOTE — Anesthesia Procedure Notes (Signed)
Procedure Name: Intubation Date/Time: 07/07/2022 2:23 PM  Performed by: Randon Goldsmith, CRNAPre-anesthesia Checklist: Patient identified, Emergency Drugs available, Suction available and Patient being monitored Patient Re-evaluated:Patient Re-evaluated prior to induction Oxygen Delivery Method: Circle system utilized Preoxygenation: Pre-oxygenation with 100% oxygen Induction Type: IV induction, Rapid sequence and Cricoid Pressure applied Laryngoscope Size: Mac and 4 Grade View: Grade I Tube type: Oral Tube size: 7.5 mm Number of attempts: 1 Airway Equipment and Method: Stylet and Oral airway Placement Confirmation: ETT inserted through vocal cords under direct vision, positive ETCO2 and breath sounds checked- equal and bilateral Secured at: 23 cm Tube secured with: Tape Dental Injury: Teeth and Oropharynx as per pre-operative assessment

## 2022-07-07 NOTE — Brief Op Note (Signed)
07/07/2022  2:46 PM  PATIENT:  Eric Gallagher  82 y.o. male  PRE-OPERATIVE DIAGNOSIS:  Dysphagia  POST-OPERATIVE DIAGNOSIS:  esophagitis, gastritis, gastric biopsies r/o hpylori, esophageal biopsies r/o esophagitis  PROCEDURE:  Procedure(s): ESOPHAGOGASTRODUODENOSCOPY (EGD) WITH PROPOFOL (N/A) BIOPSY  SURGEON:  Surgeon(s) and Role:    * Jajuan Skoog, MD - Primary  Findings ----------- -EGD showed severe LA grade D erosive esophagitis and gastritis.  Biopsies performed.  Recommendations ------------------------- -Recommend Protonix 40 mg twice a day for 3 months followed by Protonix 40 mg once a day until repeat EGD is done to document healing of esophagitis. -Recommend repeat EGD in 3 months -Avoid NSAIDs -Soft diet -Okay to discharge from GI standpoint.  GI will sign off.  Follow-up in GI clinic in 3 months after discharge  Kathi Der MD, FACP 07/07/2022, 2:47 PM  Contact #  [phone]

## 2022-07-07 NOTE — Discharge Instructions (Signed)
Your history, exam, and evaluation are consistent with esophagitis and gastritis in your esophagus and stomach.  This is causing the pain and difficulty with swallowing.  GI got some biopsies and want you to follow-up in clinic.  They would like to start you on 2 different medications with Protonix and Carafate which they called into your pharmacy.  They feel patient is safe for discharge home and as he passed a p.o. challenge we will discharge.  Will give some oral potassium as it was slightly low.  Patient will follow-up with PCP and GI team.  He had no other questions or concerns and was discharged in good condition.

## 2022-07-07 NOTE — ED Triage Notes (Addendum)
Pt reports not being able to swallow for the last 3 days. Pt was at [facility] Sunday for the same thing and told to follow up with GI. Pt hasn't been able to have anything solid for the last several days; was able to keep down 5 Ensure bottles yesterday but then this morning pt unable to swallow down the Ensure, and even started vomiting it back up. Pt currently able to manage his oral secretions   Pt has been unable to take any of his medications since this but was able to take his coumadin last night.

## 2022-07-07 NOTE — ED Provider Notes (Signed)
[facility]ERGENCY DEPARTMENT AT [facility] Provider Note   CSN: [id number] Arrival date & time: 07/07/22  2130     History  Chief Complaint  Patient presents with   Dysphagia    Eric Gallagher is a 82 y.o. male.  The history is provided by the patient and medical records. No language interpreter was used.  Chest Pain Pain location:  Substernal area Pain quality: dull   Pain quality comment:  Choking pain Pain radiates to:  Neck Pain severity:  Moderate Onset quality:  Gradual Duration:  4 days Timing:  Constant Progression:  Waxing and waning Chronicity:  New Context comment:  Choking Relieved by:  Nothing Exacerbated by: swallowing. Associated symptoms: vomiting   Associated symptoms: no abdominal pain, no back pain, no cough, no fatigue, no fever, no headache, no nausea and no shortness of breath        Home Medications Prior to Admission medications   Medication Sig Start Date End Date Taking? Authorizing Provider  ACCU-CHEK AVIVA PLUS test strip Use as directed weekly. 03/07/16   [provider]  ACCU-CHEK SOFTCLIX LANCETS lancets Use as directed weekly. 03/11/16   [provider]  alfuzosin (UROXATRAL) 10 MG 24 hr tablet Take 10 mg by mouth at bedtime. 04/15/16   [provider]  amLODipine (NORVASC) 5 MG tablet Take 5 mg by mouth daily. 07/10/21   [provider]  benazepril (LOTENSIN) 20 MG tablet Take 20 mg by mouth daily. 04/19/18   [provider]  finasteride (PROSCAR) 5 MG tablet Take 5 mg by mouth daily. 09/18/21   [provider]  glipiZIDE (GLUCOTROL) 5 MG tablet Take 2.5 mg by mouth at bedtime.    [provider]  metFORMIN (GLUCOPHAGE) 1000 MG tablet Take 1,000 mg by mouth 2 (two) times daily with a meal.    [provider]  metoprolol tartrate (LOPRESSOR) 50 MG tablet Take 1 tablet (50 mg total) by mouth 2 (two) times daily. 06/12/18   Shon Hale, MD  nitroGLYCERIN  (NITROSTAT) 0.4 MG SL tablet Place 1 tablet (0.4 mg total) under the tongue every 5 (five) minutes as needed for chest pain. 09/26/21   Swinyer, Zachary George, NP  simvastatin (ZOCOR) 40 MG tablet Take 40 mg by mouth every evening.    [provider]  warfarin (COUMADIN) 5 MG tablet Take 1 tablet (5 mg total) by mouth See admin instructions. Take 5 mg by mouth daily except for Saturdays ---Restart Coumadin on Wednesday 06/15/2018 06/12/18   Shon Hale, MD      Allergies    Flomax [tamsulosin hcl]    Review of Systems   Review of Systems  Constitutional:  Negative for chills, fatigue and fever.  HENT:  Negative for congestion.   Eyes:  Negative for visual disturbance.  Respiratory:  Positive for choking. Negative for cough, chest tightness and shortness of breath.   Cardiovascular:  Positive for chest pain.  Gastrointestinal:  Positive for vomiting. Negative for abdominal pain, constipation, diarrhea and nausea.  Genitourinary:  Negative for dysuria.  Musculoskeletal:  Negative for back pain, neck pain and neck stiffness.  Skin:  Negative for wound.  Neurological:  Negative for headaches.  Psychiatric/Behavioral:  Negative for agitation.   All other systems reviewed and are negative.   Physical Exam Updated Vital Signs BP (!) 176/106   Pulse 97   Temp 98 F (36.7 C) (Oral)   Resp 18   Ht 6\' 2"  (1.88 m)   Wt  93.9 kg   SpO2 100%   BMI 26.58 kg/m  Physical Exam Vitals and nursing note reviewed.  Constitutional:      General: He is not in acute distress.    Appearance: He is well-developed. He is not ill-appearing, toxic-appearing or diaphoretic.  HENT:     Head: Normocephalic and atraumatic.     Nose: No congestion or rhinorrhea.     Mouth/Throat:     Mouth: Mucous membranes are dry.     Pharynx: No oropharyngeal exudate or posterior oropharyngeal erythema.  Eyes:     Extraocular Movements: Extraocular movements intact.     Conjunctiva/sclera: Conjunctivae normal.      Pupils: Pupils are equal, round, and reactive to light.  Neck:     Vascular: No carotid bruit.  Cardiovascular:     Rate and Rhythm: Normal rate and regular rhythm.     Heart sounds: No murmur heard. Pulmonary:     Effort: Pulmonary effort is normal. No respiratory distress.     Breath sounds: Normal breath sounds. No wheezing, rhonchi or rales.  Chest:     Chest wall: No tenderness.  Abdominal:     Palpations: Abdomen is soft.     Tenderness: There is no abdominal tenderness. There is no right CVA tenderness, left CVA tenderness, guarding or rebound.  Musculoskeletal:        General: No swelling or tenderness.     Cervical back: Neck supple. No tenderness.  Skin:    General: Skin is warm and dry.     Capillary Refill: Capillary refill takes less than 2 seconds.     Findings: No erythema or rash.  Neurological:     Mental Status: He is alert.  Psychiatric:        Mood and Affect: Mood normal.     ED Results / Procedures / Treatments   Labs (all labs ordered are listed, but only abnormal results are displayed) Labs Reviewed  CBC WITH DIFFERENTIAL/PLATELET - Abnormal; Notable for the following components:      Result Value   Platelets 139 (*)    All other components within normal limits  PROTIME-INR - Abnormal; Notable for the following components:   Prothrombin Time 23.3 (*)    INR 2.0 (*)    All other components within normal limits  COMPREHENSIVE METABOLIC PANEL - Abnormal; Notable for the following components:   Potassium 3.1 (*)    Chloride 112 (*)    CO2 21 (*)    Glucose, Bld 118 (*)    Calcium 6.8 (*)    Total Protein 4.9 (*)    Albumin 2.5 (*)    Anion gap 4 (*)    All other components within normal limits  LIPASE, BLOOD  SURGICAL PATHOLOGY  TROPONIN I (HIGH SENSITIVITY)  TROPONIN I (HIGH SENSITIVITY)    EKG EKG Interpretation  Date/Time:  Tuesday Jul 07 2022 10:25:56 EDT Ventricular Rate:  93 PR Interval:    QRS Duration: 106 QT  Interval:  352 QTC Calculation: 438 R Axis:   13 Text Interpretation: Atrial fibrillation Abnormal R-wave progression, early transition Minimal ST depression, diffuse leads when compared to prior, similar appearance. No STEMI Confirmed by Theda Belfast ([id number]) on 07/07/2022 10:41:10 AM  Radiology DG Chest 2 View  Result Date: 07/07/2022 CLINICAL DATA:  Choking episode 4 days ago with chest pain. EXAM: CHEST - 2 VIEW COMPARISON:  X-ray 06/10/2018. Neck CT and chest x-ray 07/05/2022 from different institution FINDINGS: Slight elevation of the left hemidiaphragm. No  consolidation, pneumothorax or effusion. No edema. Dense nodule left upper lobe consistent with old granulomatous disease. Normal cardiopericardial silhouette. Calcified aorta. Scattered degenerative changes along the spine. IMPRESSION: Stable elevated left hemidiaphragm. No acute cardiopulmonary disease Electronically Signed   By: Karen Kays M.D.   On: 07/07/2022 11:12    Procedures Procedures    Medications Ordered in ED Medications  pantoprazole (PROTONIX) EC tablet 40 mg (has no administration in time range)  potassium chloride (KLOR-CON) packet 40 mEq (has no administration in time range)  sodium chloride 0.9 % bolus 1,000 mL (0 mLs Intravenous Stopped 07/07/22 1326)    ED Course/ Medical Decision Making/ A&P                             Medical Decision Making Amount and/or Complexity of Data Reviewed Labs: ordered. Radiology: ordered.  Risk Prescription drug management.    TYRAE SIEMION is a 82 y.o. male with a past medical history significant for atrial fibrillation on Coumadin therapy, previous stroke, hypertension, and CAD who presents with choking, dysphagia, and chest discomfort.  According to patient, he has never had difficulty swallowing or choking on foods in the past but on Saturday was eating some chicken quarters and got choked on a piece of chicken.  It was the first bite he had taken.  He said that  he has not been able to eat much solid since then although he did try eating some [place] food with shrimp on Sunday and felt like the first room got stuck as well.  He reports that he has not been to eat anything solid but he did try some Ensure yesterday and he got some down.  He reports he had to go very slow and trickle it would also get stuck and come back up.  He reports that he intermittently has been having to spit up his own saliva but he is tolerating it now.  He said that this morning he could not tolerate ensures and called his doctor and was told to come in.  He reports that he went to the hospital over the weekend and was told because of being a holiday he cannot get seen by a GI provider and was told he need to call them soon as possible.  He said he had a CT scan that did not show concerning findings although I cannot see this report.  He also reports his cardiac workup is reassuring.  He is currently complaining of discomfort in his central chest and he is not able to swallow solids.  He is not complaining of acute shortness of breath and is denying any fever chills or cough.  He denies any nausea but reports when he tries to put something down will come back up.  Denies any abdominal pain back pain or flank pain.  Denies extremity symptoms.  On exam, lungs were clear and chest was nontender.  I do not appreciate a loud murmur.  I do not appreciate stridor initially.  Patient resting comfortably but has dry mucous membranes.  Clinically I suspect patient has food impaction that could be chicken and shrimp.  Although he reports he had a reassuring cardiac and chest workup over the weekend, we will get a chest x-ray and troponin given his central chest discomfort and his reported previous CAD.  Will also get screening labs.  Will call GI as it has been 4 days since he is able to eat  solid foods and I am concerned about possible food bolus.  Will start workup otherwise.  Will remain n.p.o. and  give some fluids.   Patient has been taken to endoscopy with GI.  Will await his return to determine disposition.  3:35 PM Patient has returned from GI where he had EGD and found esophagitis.  Biopsies were taken.  They create a plan for outpatient follow-up and Protonix and Carafate.  Will give some oral potassium now as it was slightly low and patient will be discharged per their plan.  Will confirm this with the patient.    Patient successfully swallowed medications and passed p.o. challenge.  Patient will be discharged for outpatient GI follow-up and PCP follow-up.  He reports no concerns and was discharged in good condition.         Final Clinical Impression(s) / ED Diagnoses Final diagnoses:  None    Rx / DC Orders ED Discharge Orders          Ordered    pantoprazole (PROTONIX) 40 MG tablet  2 times daily        07/07/22 1455    sucralfate (CARAFATE) 1 g tablet  2 times daily after meals        07/07/22 1520            Clinical Impression: 1. Esophagitis   2. Hypokalemia     Disposition: Discharge  Condition: Good  I have discussed the results, Dx and Tx plan with the pt(& family if present). He/she/they expressed understanding and agree(s) with the plan. Discharge instructions discussed at great length. Strict return precautions discussed and pt &/or family have verbalized understanding of the instructions. No further questions at time of discharge.    Current Discharge Medication List     START taking these medications   Details  pantoprazole (PROTONIX) 40 MG tablet Take 1 tablet (40 mg total) by mouth 2 (two) times daily. Qty: 180 tablet, Refills: 1    sucralfate (CARAFATE) 1 g tablet Take 1 tablet (1 g total) by mouth 2 (two) times daily after a meal. Qty: 60 tablet, Refills: 0        Follow Up: Kathi Der, MD [address] [place] [address] [phone]  Schedule an appointment as soon as possible for a visit in 1  month(s) F/Up For esophagitis     Willamae Demby, Canary Brim, MD 07/07/22 [phone]

## 2022-07-07 NOTE — Anesthesia Preprocedure Evaluation (Addendum)
Anesthesia Evaluation  Patient identified by MRN, date of birth, ID band Patient awake    Reviewed: Allergy & Precautions, NPO status , Patient's Chart, lab work & pertinent test results  History of Anesthesia Complications Negative for: history of anesthetic complications  Airway Mallampati: II  TM Distance: >3 FB Neck ROM: Full    Dental  (+) Dental Advisory Given, Teeth Intact, Chipped, Missing,    Pulmonary former smoker   breath sounds clear to auscultation       Cardiovascular hypertension, Pt. on medications + angina  + CAD, + Past MI and + Cardiac Stents  + dysrhythmias Atrial Fibrillation  Rhythm:Irregular Rate:Normal     Neuro/Psych CVA, No Residual Symptoms  negative psych ROS   GI/Hepatic negative GI ROS, Neg liver ROS,,,  Endo/Other  diabetes, Type 2, Oral Hypoglycemic Agents    Renal/GU negative Renal ROS     Musculoskeletal  (+) Arthritis ,    Abdominal   Peds  Hematology negative hematology ROS (+)   Anesthesia Other Findings   Reproductive/Obstetrics                             Anesthesia Physical Anesthesia Plan  ASA: 3  Anesthesia Plan: General   Post-op Pain Management: Minimal or no pain anticipated   Induction: Cricoid pressure planned, Rapid sequence and Intravenous  PONV Risk Score and Plan: 1 and Treatment may vary due to age or medical condition and Propofol infusion  Airway Management Planned: Oral ETT  Additional Equipment: None  Intra-op Plan:   Post-operative Plan: Extubation in OR  Informed Consent: I have reviewed the patients History and Physical, chart, labs and discussed the procedure including the risks, benefits and alternatives for the proposed anesthesia with the patient or authorized representative who has indicated his/her understanding and acceptance.       Plan Discussed with: CRNA and Anesthesiologist  Anesthesia Plan  Comments: (Labs reviewed, platelets acceptable. Discussed risks and benefits of spinal, including spinal/epidural hematoma, infection, failed block, and PDPH. Patient expressed understanding and wished to proceed. )        Anesthesia Quick Evaluation

## 2022-07-07 NOTE — Op Note (Signed)
[facility] Patient Name: Eric Gallagher Procedure Date : 07/07/2022 MRN: [id number] Attending MD: Kathi Der , MD, [id number] Date of Birth: [birth year] CSN: [id number] Age: 82 Admit Type: Inpatient Procedure:                Upper GI endoscopy Indications:              Dysphagia Providers:                Kathi Der, MD, Lorenza Evangelist, RN,                            Kandice Robinsons, Technician Referring MD:              Medicines:                Sedation Administered by an Anesthesia Professional Complications:            No immediate complications. Estimated Blood Loss:     Estimated blood loss was minimal. Procedure:                Pre-Anesthesia Assessment:                           - Prior to the procedure, a History and Physical                            was performed, and patient medications and                            allergies were reviewed. The patient's tolerance of                            previous anesthesia was also reviewed. The risks                            and benefits of the procedure and the sedation                            options and risks were discussed with the patient.                            All questions were answered, and informed consent                            was obtained. Prior Anticoagulants: The patient has                            taken Coumadin (warfarin), last dose was 1 day                            prior to procedure. ASA Grade Assessment: III - A                            patient with severe systemic disease. After  reviewing the risks and benefits, the patient was                            deemed in satisfactory condition to undergo the                            procedure.                           After obtaining informed consent, the endoscope was                            passed under direct vision. Throughout the                            procedure, the patient's  blood pressure, pulse, and                            oxygen saturations were monitored continuously. The                            [id number]-[id number] ([id number]) Olympus endoscope was introduced                            through the mouth, and advanced to the second part                            of duodenum. The upper GI endoscopy was                            accomplished without difficulty. The patient                            tolerated the procedure well. Scope In: Scope Out: Findings:      LA Grade D (one or more mucosal breaks involving at least 75% of       esophageal circumference) esophagitis with no bleeding was found in the       mid esophagus. Biopsies were taken with a cold forceps for histology.      The Z-line was irregular and was found 38 cm from the incisors.      Diffuse severe inflammation characterized by congestion (edema),       erosions, erythema and friability was found in the entire examined       stomach. Biopsies were taken with a cold forceps for histology.      The cardia and gastric fundus were normal on retroflexion.      The duodenal bulb, first portion of the duodenum and second portion of       the duodenum were normal. Impression:               - LA Grade D erosive esophagitis with no bleeding.                            Biopsied.                           -  Z-line irregular, 38 cm from the incisors.                           - Chronic gastritis. Biopsied.                           - Normal duodenal bulb, first portion of the                            duodenum and second portion of the duodenum. Recommendation:           - Patient has a contact number available for                            emergencies. The signs and symptoms of potential                            delayed complications were discussed with the                            patient. Return to normal activities tomorrow.                            Written discharge instructions were provided  to the                            patient.                           - Soft diet.                           - Continue present medications.                           - Await pathology results.                           - Repeat upper endoscopy in 3 months to check                            healing.                           - Use Protonix (pantoprazole) 40 mg PO BID for 3                            months.                           - No ibuprofen, naproxen, or other non-steroidal                            anti-inflammatory drugs.                           - Resume Coumadin (warfarin) at prior dose tomorrow. Procedure  Code(s):        --- Professional ---                           [phone], Esophagogastroduodenoscopy, flexible,                            transoral; with biopsy, single or multiple Diagnosis Code(s):        --- Professional ---                           K20.80, Other esophagitis without bleeding                           K22.89                           K29.50, Unspecified chronic gastritis without                            bleeding                           R13.10, Dysphagia, unspecified CPT copyright 2022 [employer]. All rights reserved. The codes documented in this report are preliminary and upon coder review may  be revised to meet current compliance requirements. Kathi Der, MD Kathi Der, MD 07/07/2022 2:44:13 PM Number of Addenda: 0

## 2022-07-07 NOTE — Consult Note (Signed)
Referring Provider: TH Primary Care Physician:  Georgann Housekeeper, MD Primary Gastroenterologist: Deboraha Sprang PCP  Reason for Consultation: Dysphagia  HPI: Eric Gallagher is a 82 y.o. male with past medical history of coronary artery disease, chronic atrial fibrillation on anticoagulation with Coumadin, chronic thrombocytopenia presented to the hospital with worsening dysphagia.  Blood work showed normal CBC except for platelet count of 139, INR of 2.0.  GI is consulted for further evaluation.  patient seen and examined at bedside.  He started having sensation of food stuck in esophagus on Saturday.  Initially was seen in urgent care and was subsequently advised to go to [facility].  According to patient, he had x-rays and CT scan done and was advised to follow-up with GI.  He is complaining of sensation of food not going down.  Mainly solid food.  No issues with the liquid.  Denies any abdominal pain, blood in the stool or black stool.  No similar symptoms in the past.   Past Medical History:  Diagnosis Date   Anginal pain (HCC)    h/o    Arrhythmia    Chronic AFIB, no anti-coag patient desire and low platelet count   Arthritis    both hips    BPH (benign prostatic hyperplasia)    Chronic a-fib (HCC)    Coronary artery disease    NSTEMI in 2005, Taxus DES LAD   Diabetes mellitus without complication (HCC)    type II, some neuropathy   Dysrhythmia    afib   ED (erectile dysfunction)    History of kidney stones 2012   passed spontaneously   Hyperlipidemia    Hypertension    Old MI (myocardial infarction)    Stroke (HCC) 2017   Thrombocytopenia (HCC)    chronic    Past Surgical History:  Procedure Laterality Date   APPENDECTOMY     as a teen   CARDIAC CATHETERIZATION N/A 07/24/2014   Procedure: Right/Left Heart Cath and Coronary Angiography;  Surgeon: Lyn Records, MD;  Location: [facility];  Service: Cardiovascular;  Laterality: N/A;   EYE SURGERY Left 2016   retinal  surgery, cataracts removed- both eyes   HERNIA REPAIR  1998   L inguinal    I & D EXTREMITY Right 06/18/2014   Procedure: OPEN TREATMENT RIGHT THUMB, INCOMPLETE AMPUTATION.  Washing and dressing applied to left thumb.;  Surgeon: Mack Hook, MD;  Location: [facility] OR;  Service: Orthopedics;  Laterality: Right;   TOTAL HIP ARTHROPLASTY Right 01/04/2018   Procedure: RIGHT TOTAL HIP ARTHROPLASTY ANTERIOR APPROACH;  Surgeon: Kathryne Hitch, MD;  Location: [facility] OR;  Service: Orthopedics;  Laterality: Right;   TOTAL HIP ARTHROPLASTY Left 03/01/2018   Procedure: LEFT TOTAL HIP ARTHROPLASTY ANTERIOR APPROACH;  Surgeon: Kathryne Hitch, MD;  Location: [facility] OR;  Service: Orthopedics;  Laterality: Left;    Prior to Admission medications   Medication Sig Start Date End Date Taking? Authorizing Provider  ACCU-CHEK AVIVA PLUS test strip Use as directed weekly. 03/07/16   [provider]  ACCU-CHEK SOFTCLIX LANCETS lancets Use as directed weekly. 03/11/16   [provider]  alfuzosin (UROXATRAL) 10 MG 24 hr tablet Take 10 mg by mouth at bedtime. 04/15/16   [provider]  amLODipine (NORVASC) 5 MG tablet Take 5 mg by mouth daily. 07/10/21   [provider]  benazepril (LOTENSIN) 20 MG tablet Take 20 mg by mouth daily. 04/19/18   [provider]  finasteride (PROSCAR) 5 MG tablet Take 5  mg by mouth daily. 09/18/21   [provider]  glipiZIDE (GLUCOTROL) 5 MG tablet Take 2.5 mg by mouth at bedtime.    [provider]  metFORMIN (GLUCOPHAGE) 1000 MG tablet Take 1,000 mg by mouth 2 (two) times daily with a meal.    [provider]  metoprolol tartrate (LOPRESSOR) 50 MG tablet Take 1 tablet (50 mg total) by mouth 2 (two) times daily. 06/12/18   Shon Hale, MD  nitroGLYCERIN (NITROSTAT) 0.4 MG SL tablet Place 1 tablet (0.4 mg total) under the tongue every 5 (five) minutes as needed for chest pain. 09/26/21   Swinyer, Zachary George, NP   simvastatin (ZOCOR) 40 MG tablet Take 40 mg by mouth every evening.    [provider]  warfarin (COUMADIN) 5 MG tablet Take 1 tablet (5 mg total) by mouth See admin instructions. Take 5 mg by mouth daily except for Saturdays ---Restart Coumadin on Wednesday 06/15/2018 06/12/18   Shon Hale, MD    Scheduled Meds: Continuous Infusions: PRN Meds:.  Allergies as of 07/07/2022 - Review Complete 07/07/2022  Allergen Reaction Noted   Flomax [tamsulosin hcl] Other (See Comments) 02/07/2014    Family History  Problem Relation Age of Onset   CVA Mother    Aneurysm Father    Heart disease Brother    Heart disease Brother    Heart attack Maternal Aunt    Heart disease Maternal Aunt     Social History   Socioeconomic History   Marital status: Married    Spouse name: Not on file   Number of children: Not on file   Years of education: Not on file   Highest education level: Not on file  Occupational History   Not on file  Tobacco Use   Smoking status: Former    Types: Cigarettes    Quit date: 12/29/1965    Years since quitting: 56.5   Smokeless tobacco: Never  Vaping Use   Vaping Use: Never used  Substance and Sexual Activity   Alcohol use: No    Alcohol/week: 0.0 standard drinks of alcohol   Drug use: No   Sexual activity: Not on file  Other Topics Concern   Not on file  Social History Narrative   Not on file   Social Determinants of Health   Financial Resource Strain: Not on file  Food Insecurity: Not on file  Transportation Needs: Not on file  Physical Activity: Not on file  Stress: Not on file  Social Connections: Not on file  Intimate Partner Violence: Not on file    Review of Systems: All negative except as stated above in HPI.  Physical Exam: Vital signs: Vitals:   07/07/22 0945  BP: (!) 176/106  Pulse: 97  Resp: 18  Temp: 98 F (36.7 C)  SpO2: 100%     General:   Alert,  Well-developed, well-nourished, pleasant and cooperative in  NAD Lungs:  Clear throughout to auscultation.   No wheezes, crackles, or rhonchi. No acute distress. Heart:  IRRR Abdomen: soft, NT, ND, BS +  Rectal:  Deferred  GI:  Lab Results: Recent Labs    07/07/22 1026  WBC 7.6  HGB 15.0  HCT 46.0  PLT 139*   BMET No results for input(s): "NA", "K", "CL", "CO2", "GLUCOSE", "BUN", "CREATININE", "CALCIUM" in the last 72 hours. LFT No results for input(s): "PROT", "ALBUMIN", "AST", "ALT", "ALKPHOS", "BILITOT", "BILIDIR", "IBILI" in the last 72 hours. PT/INR Recent Labs    07/07/22 1026  LABPROT 23.3*  INR 2.0*     Studies/Results: DG Chest 2 View  Result Date: 07/07/2022 CLINICAL DATA:  Choking episode 4 days ago with chest pain. EXAM: CHEST - 2 VIEW COMPARISON:  X-ray 06/10/2018. Neck CT and chest x-ray 07/05/2022 from different institution FINDINGS: Slight elevation of the left hemidiaphragm. No consolidation, pneumothorax or effusion. No edema. Dense nodule left upper lobe consistent with old granulomatous disease. Normal cardiopericardial silhouette. Calcified aorta. Scattered degenerative changes along the spine. IMPRESSION: Stable elevated left hemidiaphragm. No acute cardiopulmonary disease Electronically Signed   By: Karen Kays M.D.   On: 07/07/2022 11:12    Impression/Plan: - Dysphagia - Reflux  - H/o Afib -on coumadin. INR 2 today   Recommendations  ----------------------------- - Plan for EGD today depending on Anesthesia availability - Keep NPO for now   Risks (bleeding, infection, bowel perforation that could require surgery, sedation-related changes in cardiopulmonary systems), benefits (identification and possible treatment of source of symptoms, exclusion of certain causes of symptoms), and alternatives (watchful waiting, radiographic imaging studies, empiric medical treatment)  were explained to patient/family in detail and patient wishes to proceed.      LOS: 0 days   Kathi Der  MD, FACP 07/07/2022,  11:42 AM  Contact #  [phone]

## 2022-07-08 DIAGNOSIS — Z7901 Long term (current) use of anticoagulants: Secondary | ICD-10-CM | POA: Diagnosis not present

## 2022-07-08 NOTE — Anesthesia Postprocedure Evaluation (Signed)
Anesthesia Post Note  Patient: Eric Gallagher  Procedure(s) Performed: ESOPHAGOGASTRODUODENOSCOPY (EGD) WITH PROPOFOL BIOPSY     Patient location during evaluation: PACU Anesthesia Type: General Level of consciousness: awake and alert Pain management: pain level controlled Vital Signs Assessment: post-procedure vital signs reviewed and stable Respiratory status: spontaneous breathing, nonlabored ventilation, respiratory function stable and patient connected to nasal cannula oxygen Cardiovascular status: blood pressure returned to baseline and stable Postop Assessment: no apparent nausea or vomiting Anesthetic complications: no   No notable events documented.  Last Vitals:  Vitals:   07/07/22 1515 07/07/22 1548  BP: (!) 154/94 (!) 147/76  Pulse: 92 81  Resp: 20 18  Temp:  36.6 C  SpO2: 97% 100%    Last Pain:  Vitals:   07/07/22 1548  TempSrc:   PainSc: 4                  Latreece Mochizuki

## 2022-07-10 LAB — SURGICAL PATHOLOGY

## 2022-07-12 ENCOUNTER — Encounter (HOSPITAL_COMMUNITY): Payer: Self-pay | Admitting: Gastroenterology

## 2022-07-24 DIAGNOSIS — E538 Deficiency of other specified B group vitamins: Secondary | ICD-10-CM | POA: Diagnosis not present

## 2022-07-31 DIAGNOSIS — Z7901 Long term (current) use of anticoagulants: Secondary | ICD-10-CM | POA: Diagnosis not present

## 2022-08-24 DIAGNOSIS — E538 Deficiency of other specified B group vitamins: Secondary | ICD-10-CM | POA: Diagnosis not present

## 2022-08-25 DIAGNOSIS — R350 Frequency of micturition: Secondary | ICD-10-CM | POA: Diagnosis not present

## 2022-09-11 DIAGNOSIS — D696 Thrombocytopenia, unspecified: Secondary | ICD-10-CM | POA: Diagnosis not present

## 2022-09-11 DIAGNOSIS — I10 Essential (primary) hypertension: Secondary | ICD-10-CM | POA: Diagnosis not present

## 2022-09-11 DIAGNOSIS — Z7901 Long term (current) use of anticoagulants: Secondary | ICD-10-CM | POA: Diagnosis not present

## 2022-09-11 DIAGNOSIS — I69354 Hemiplegia and hemiparesis following cerebral infarction affecting left non-dominant side: Secondary | ICD-10-CM | POA: Diagnosis not present

## 2022-09-11 DIAGNOSIS — I700 Atherosclerosis of aorta: Secondary | ICD-10-CM | POA: Diagnosis not present

## 2022-09-11 DIAGNOSIS — Z0000 Encounter for general adult medical examination without abnormal findings: Secondary | ICD-10-CM | POA: Diagnosis not present

## 2022-09-11 DIAGNOSIS — E1140 Type 2 diabetes mellitus with diabetic neuropathy, unspecified: Secondary | ICD-10-CM | POA: Diagnosis not present

## 2022-09-11 DIAGNOSIS — I25119 Atherosclerotic heart disease of native coronary artery with unspecified angina pectoris: Secondary | ICD-10-CM | POA: Diagnosis not present

## 2022-09-11 DIAGNOSIS — E782 Mixed hyperlipidemia: Secondary | ICD-10-CM | POA: Diagnosis not present

## 2022-09-11 DIAGNOSIS — I4821 Permanent atrial fibrillation: Secondary | ICD-10-CM | POA: Diagnosis not present

## 2022-09-11 DIAGNOSIS — D6869 Other thrombophilia: Secondary | ICD-10-CM | POA: Diagnosis not present

## 2022-09-11 DIAGNOSIS — Z23 Encounter for immunization: Secondary | ICD-10-CM | POA: Diagnosis not present

## 2022-09-24 DIAGNOSIS — E538 Deficiency of other specified B group vitamins: Secondary | ICD-10-CM | POA: Diagnosis not present

## 2022-09-24 DIAGNOSIS — Z7901 Long term (current) use of anticoagulants: Secondary | ICD-10-CM | POA: Diagnosis not present

## 2022-10-26 DIAGNOSIS — Z23 Encounter for immunization: Secondary | ICD-10-CM | POA: Diagnosis not present

## 2022-10-26 DIAGNOSIS — Z7901 Long term (current) use of anticoagulants: Secondary | ICD-10-CM | POA: Diagnosis not present

## 2022-10-26 DIAGNOSIS — E538 Deficiency of other specified B group vitamins: Secondary | ICD-10-CM | POA: Diagnosis not present

## 2022-11-26 DIAGNOSIS — Z7901 Long term (current) use of anticoagulants: Secondary | ICD-10-CM | POA: Diagnosis not present

## 2022-11-26 DIAGNOSIS — E538 Deficiency of other specified B group vitamins: Secondary | ICD-10-CM | POA: Diagnosis not present

## 2022-12-01 DIAGNOSIS — M1990 Unspecified osteoarthritis, unspecified site: Secondary | ICD-10-CM | POA: Diagnosis not present

## 2022-12-01 DIAGNOSIS — Z7901 Long term (current) use of anticoagulants: Secondary | ICD-10-CM | POA: Diagnosis not present

## 2022-12-01 DIAGNOSIS — Z8673 Personal history of transient ischemic attack (TIA), and cerebral infarction without residual deficits: Secondary | ICD-10-CM | POA: Diagnosis not present

## 2022-12-01 DIAGNOSIS — E11621 Type 2 diabetes mellitus with foot ulcer: Secondary | ICD-10-CM | POA: Diagnosis not present

## 2022-12-01 DIAGNOSIS — L97519 Non-pressure chronic ulcer of other part of right foot with unspecified severity: Secondary | ICD-10-CM | POA: Diagnosis not present

## 2022-12-01 DIAGNOSIS — E119 Type 2 diabetes mellitus without complications: Secondary | ICD-10-CM | POA: Diagnosis not present

## 2022-12-01 DIAGNOSIS — Z79899 Other long term (current) drug therapy: Secondary | ICD-10-CM | POA: Diagnosis not present

## 2022-12-01 DIAGNOSIS — L02611 Cutaneous abscess of right foot: Secondary | ICD-10-CM | POA: Diagnosis not present

## 2022-12-01 DIAGNOSIS — E11628 Type 2 diabetes mellitus with other skin complications: Secondary | ICD-10-CM | POA: Diagnosis not present

## 2022-12-01 DIAGNOSIS — S91109A Unspecified open wound of unspecified toe(s) without damage to nail, initial encounter: Secondary | ICD-10-CM | POA: Diagnosis not present

## 2022-12-01 DIAGNOSIS — M79674 Pain in right toe(s): Secondary | ICD-10-CM | POA: Diagnosis not present

## 2022-12-01 DIAGNOSIS — B9561 Methicillin susceptible Staphylococcus aureus infection as the cause of diseases classified elsewhere: Secondary | ICD-10-CM | POA: Diagnosis not present

## 2022-12-01 DIAGNOSIS — R791 Abnormal coagulation profile: Secondary | ICD-10-CM | POA: Diagnosis not present

## 2022-12-01 DIAGNOSIS — R9431 Abnormal electrocardiogram [ECG] [EKG]: Secondary | ICD-10-CM | POA: Diagnosis not present

## 2022-12-01 DIAGNOSIS — M86171 Other acute osteomyelitis, right ankle and foot: Secondary | ICD-10-CM | POA: Diagnosis not present

## 2022-12-01 DIAGNOSIS — Z7984 Long term (current) use of oral hypoglycemic drugs: Secondary | ICD-10-CM | POA: Diagnosis not present

## 2022-12-01 DIAGNOSIS — I510 Cardiac septal defect, acquired: Secondary | ICD-10-CM | POA: Diagnosis not present

## 2022-12-01 DIAGNOSIS — M7989 Other specified soft tissue disorders: Secondary | ICD-10-CM | POA: Diagnosis not present

## 2022-12-01 DIAGNOSIS — S99921A Unspecified injury of right foot, initial encounter: Secondary | ICD-10-CM | POA: Diagnosis not present

## 2022-12-01 DIAGNOSIS — M19071 Primary osteoarthritis, right ankle and foot: Secondary | ICD-10-CM | POA: Diagnosis not present

## 2022-12-01 DIAGNOSIS — I10 Essential (primary) hypertension: Secondary | ICD-10-CM | POA: Diagnosis not present

## 2022-12-01 DIAGNOSIS — I4819 Other persistent atrial fibrillation: Secondary | ICD-10-CM | POA: Diagnosis not present

## 2022-12-01 DIAGNOSIS — M79671 Pain in right foot: Secondary | ICD-10-CM | POA: Diagnosis not present

## 2022-12-01 DIAGNOSIS — I2510 Atherosclerotic heart disease of native coronary artery without angina pectoris: Secondary | ICD-10-CM | POA: Diagnosis not present

## 2022-12-01 DIAGNOSIS — L03031 Cellulitis of right toe: Secondary | ICD-10-CM | POA: Diagnosis not present

## 2022-12-01 DIAGNOSIS — I252 Old myocardial infarction: Secondary | ICD-10-CM | POA: Diagnosis not present

## 2022-12-01 DIAGNOSIS — Z87891 Personal history of nicotine dependence: Secondary | ICD-10-CM | POA: Diagnosis not present

## 2022-12-01 DIAGNOSIS — I4891 Unspecified atrial fibrillation: Secondary | ICD-10-CM | POA: Diagnosis not present

## 2022-12-01 DIAGNOSIS — M7731 Calcaneal spur, right foot: Secondary | ICD-10-CM | POA: Diagnosis not present

## 2022-12-01 DIAGNOSIS — E1169 Type 2 diabetes mellitus with other specified complication: Secondary | ICD-10-CM | POA: Diagnosis not present

## 2022-12-01 DIAGNOSIS — E11622 Type 2 diabetes mellitus with other skin ulcer: Secondary | ICD-10-CM | POA: Diagnosis not present

## 2022-12-01 DIAGNOSIS — M869 Osteomyelitis, unspecified: Secondary | ICD-10-CM | POA: Diagnosis not present

## 2022-12-08 DIAGNOSIS — K5790 Diverticulosis of intestine, part unspecified, without perforation or abscess without bleeding: Secondary | ICD-10-CM | POA: Diagnosis not present

## 2022-12-08 DIAGNOSIS — Z89411 Acquired absence of right great toe: Secondary | ICD-10-CM | POA: Diagnosis not present

## 2022-12-08 DIAGNOSIS — Z792 Long term (current) use of antibiotics: Secondary | ICD-10-CM | POA: Diagnosis not present

## 2022-12-08 DIAGNOSIS — Z5181 Encounter for therapeutic drug level monitoring: Secondary | ICD-10-CM | POA: Diagnosis not present

## 2022-12-08 DIAGNOSIS — E782 Mixed hyperlipidemia: Secondary | ICD-10-CM | POA: Diagnosis not present

## 2022-12-08 DIAGNOSIS — E1151 Type 2 diabetes mellitus with diabetic peripheral angiopathy without gangrene: Secondary | ICD-10-CM | POA: Diagnosis not present

## 2022-12-08 DIAGNOSIS — I2510 Atherosclerotic heart disease of native coronary artery without angina pectoris: Secondary | ICD-10-CM | POA: Diagnosis not present

## 2022-12-08 DIAGNOSIS — I4821 Permanent atrial fibrillation: Secondary | ICD-10-CM | POA: Diagnosis not present

## 2022-12-08 DIAGNOSIS — E1140 Type 2 diabetes mellitus with diabetic neuropathy, unspecified: Secondary | ICD-10-CM | POA: Diagnosis not present

## 2022-12-08 DIAGNOSIS — I700 Atherosclerosis of aorta: Secondary | ICD-10-CM | POA: Diagnosis not present

## 2022-12-08 DIAGNOSIS — M7731 Calcaneal spur, right foot: Secondary | ICD-10-CM | POA: Diagnosis not present

## 2022-12-08 DIAGNOSIS — Z4781 Encounter for orthopedic aftercare following surgical amputation: Secondary | ICD-10-CM | POA: Diagnosis not present

## 2022-12-08 DIAGNOSIS — D6869 Other thrombophilia: Secondary | ICD-10-CM | POA: Diagnosis not present

## 2022-12-08 DIAGNOSIS — K2210 Ulcer of esophagus without bleeding: Secondary | ICD-10-CM | POA: Diagnosis not present

## 2022-12-08 DIAGNOSIS — I8393 Asymptomatic varicose veins of bilateral lower extremities: Secondary | ICD-10-CM | POA: Diagnosis not present

## 2022-12-08 DIAGNOSIS — Z7984 Long term (current) use of oral hypoglycemic drugs: Secondary | ICD-10-CM | POA: Diagnosis not present

## 2022-12-08 DIAGNOSIS — M19071 Primary osteoarthritis, right ankle and foot: Secondary | ICD-10-CM | POA: Diagnosis not present

## 2022-12-08 DIAGNOSIS — B9561 Methicillin susceptible Staphylococcus aureus infection as the cause of diseases classified elsewhere: Secondary | ICD-10-CM | POA: Diagnosis not present

## 2022-12-08 DIAGNOSIS — D696 Thrombocytopenia, unspecified: Secondary | ICD-10-CM | POA: Diagnosis not present

## 2022-12-08 DIAGNOSIS — I69354 Hemiplegia and hemiparesis following cerebral infarction affecting left non-dominant side: Secondary | ICD-10-CM | POA: Diagnosis not present

## 2022-12-08 DIAGNOSIS — I10 Essential (primary) hypertension: Secondary | ICD-10-CM | POA: Diagnosis not present

## 2022-12-08 DIAGNOSIS — I252 Old myocardial infarction: Secondary | ICD-10-CM | POA: Diagnosis not present

## 2022-12-08 DIAGNOSIS — L03031 Cellulitis of right toe: Secondary | ICD-10-CM | POA: Diagnosis not present

## 2022-12-08 DIAGNOSIS — E538 Deficiency of other specified B group vitamins: Secondary | ICD-10-CM | POA: Diagnosis not present

## 2022-12-09 DIAGNOSIS — Z89421 Acquired absence of other right toe(s): Secondary | ICD-10-CM | POA: Diagnosis not present

## 2022-12-11 ENCOUNTER — Emergency Department (HOSPITAL_COMMUNITY): Payer: Medicare Other

## 2022-12-11 ENCOUNTER — Other Ambulatory Visit: Payer: Self-pay

## 2022-12-11 ENCOUNTER — Encounter (HOSPITAL_COMMUNITY): Payer: Self-pay

## 2022-12-11 ENCOUNTER — Encounter: Payer: Self-pay | Admitting: Internal Medicine

## 2022-12-11 ENCOUNTER — Emergency Department (HOSPITAL_COMMUNITY)
Admission: EM | Admit: 2022-12-11 | Discharge: 2022-12-11 | Disposition: A | Discharge status: Home / Self Care | Payer: Medicare Other | Attending: Emergency Medicine | Admitting: Emergency Medicine

## 2022-12-11 DIAGNOSIS — R55 Syncope and collapse: Secondary | ICD-10-CM | POA: Diagnosis not present

## 2022-12-11 DIAGNOSIS — I6782 Cerebral ischemia: Secondary | ICD-10-CM | POA: Diagnosis not present

## 2022-12-11 DIAGNOSIS — Z7901 Long term (current) use of anticoagulants: Secondary | ICD-10-CM | POA: Insufficient documentation

## 2022-12-11 DIAGNOSIS — I499 Cardiac arrhythmia, unspecified: Secondary | ICD-10-CM | POA: Diagnosis not present

## 2022-12-11 DIAGNOSIS — I482 Chronic atrial fibrillation, unspecified: Secondary | ICD-10-CM | POA: Diagnosis not present

## 2022-12-11 DIAGNOSIS — I4891 Unspecified atrial fibrillation: Secondary | ICD-10-CM | POA: Diagnosis not present

## 2022-12-11 DIAGNOSIS — R29818 Other symptoms and signs involving the nervous system: Secondary | ICD-10-CM | POA: Diagnosis not present

## 2022-12-11 DIAGNOSIS — Z743 Need for continuous supervision: Secondary | ICD-10-CM | POA: Diagnosis not present

## 2022-12-11 DIAGNOSIS — I2510 Atherosclerotic heart disease of native coronary artery without angina pectoris: Secondary | ICD-10-CM | POA: Insufficient documentation

## 2022-12-11 DIAGNOSIS — R9089 Other abnormal findings on diagnostic imaging of central nervous system: Secondary | ICD-10-CM | POA: Diagnosis not present

## 2022-12-11 DIAGNOSIS — R29810 Facial weakness: Secondary | ICD-10-CM | POA: Insufficient documentation

## 2022-12-11 DIAGNOSIS — R4020 Unspecified coma: Secondary | ICD-10-CM

## 2022-12-11 DIAGNOSIS — I672 Cerebral atherosclerosis: Secondary | ICD-10-CM | POA: Diagnosis not present

## 2022-12-11 DIAGNOSIS — I6521 Occlusion and stenosis of right carotid artery: Secondary | ICD-10-CM | POA: Insufficient documentation

## 2022-12-11 DIAGNOSIS — Z8673 Personal history of transient ischemic attack (TIA), and cerebral infarction without residual deficits: Secondary | ICD-10-CM | POA: Insufficient documentation

## 2022-12-11 DIAGNOSIS — R404 Transient alteration of awareness: Secondary | ICD-10-CM | POA: Diagnosis not present

## 2022-12-11 LAB — I-STAT CHEM 8, ED
BUN: 13 mg/dL (ref 8–23)
Calcium, Ion: 1.22 mmol/L (ref 1.15–1.40)
Chloride: 103 mmol/L (ref 98–111)
Creatinine, Ser: 1 mg/dL (ref 0.61–1.24)
Glucose, Bld: 175 mg/dL — ABNORMAL HIGH (ref 70–99)
HCT: 42 % (ref 39.0–52.0)
Hemoglobin: 14.3 g/dL (ref 13.0–17.0)
Potassium: 5.2 mmol/L — ABNORMAL HIGH (ref 3.5–5.1)
Sodium: 138 mmol/L (ref 135–145)
TCO2: 23 mmol/L (ref 22–32)

## 2022-12-11 LAB — DIFFERENTIAL
Abs Immature Granulocytes: 0.02 10*3/uL (ref 0.00–0.07)
Basophils Absolute: 0 10*3/uL (ref 0.0–0.1)
Basophils Relative: 0 %
Eosinophils Absolute: 0.1 10*3/uL (ref 0.0–0.5)
Eosinophils Relative: 1 %
Immature Granulocytes: 0 %
Lymphocytes Relative: 10 %
Lymphs Abs: 0.7 10*3/uL (ref 0.7–4.0)
Monocytes Absolute: 0.6 10*3/uL (ref 0.1–1.0)
Monocytes Relative: 8 %
Neutro Abs: 5.8 10*3/uL (ref 1.7–7.7)
Neutrophils Relative %: 81 %

## 2022-12-11 LAB — COMPREHENSIVE METABOLIC PANEL
ALT: 16 U/L (ref 0–44)
AST: 21 U/L (ref 15–41)
Albumin: 3.5 g/dL (ref 3.5–5.0)
Alkaline Phosphatase: 49 U/L (ref 38–126)
Anion gap: 11 (ref 5–15)
BUN: 13 mg/dL (ref 8–23)
CO2: 22 mmol/L (ref 22–32)
Calcium: 8.9 mg/dL (ref 8.9–10.3)
Chloride: 101 mmol/L (ref 98–111)
Creatinine, Ser: 1.05 mg/dL (ref 0.61–1.24)
GFR, Estimated: >60 mL/min (ref >60)
Glucose, Bld: 179 mg/dL — ABNORMAL HIGH (ref 70–99)
Potassium: 5.1 mmol/L (ref 3.5–5.1)
Sodium: 134 mmol/L — ABNORMAL LOW (ref 135–145)
Total Bilirubin: 0.6 mg/dL (ref 0.3–1.2)
Total Protein: 6.8 g/dL (ref 6.5–8.1)

## 2022-12-11 LAB — CBC
HCT: 40.4 % (ref 39.0–52.0)
Hemoglobin: 13.3 g/dL (ref 13.0–17.0)
MCH: 31.4 pg (ref 26.0–34.0)
MCHC: 32.9 g/dL (ref 30.0–36.0)
MCV: 95.5 fL (ref 80.0–100.0)
Platelets: 155 10*3/uL (ref 150–400)
RBC: 4.23 MIL/uL (ref 4.22–5.81)
RDW: 13.2 % (ref 11.5–15.5)
WBC: 7.2 10*3/uL (ref 4.0–10.5)
nRBC: 0 % (ref 0.0–0.2)

## 2022-12-11 LAB — APTT: aPTT: 26 s (ref 24–36)

## 2022-12-11 LAB — PROTIME-INR
INR: 2.1 — ABNORMAL HIGH (ref 0.8–1.2)
Prothrombin Time: 23.7 s — ABNORMAL HIGH (ref 11.4–15.2)

## 2022-12-11 LAB — ETHANOL: Alcohol, Ethyl (B): <10 mg/dL (ref <10)

## 2022-12-11 LAB — CBG MONITORING, ED: Glucose-Capillary: 169 mg/dL — ABNORMAL HIGH (ref 70–99)

## 2022-12-11 MED ORDER — IOHEXOL 350 MG/ML SOLN: 75.0000 mL | Freq: Once | INTRAVENOUS | Status: DC | PRN

## 2022-12-11 MED ORDER — SODIUM CHLORIDE 0.9% FLUSH
3.0000 mL | Freq: Once | INTRAVENOUS | Status: AC
  Administered 2022-12-11: 3 mL via INTRAVENOUS

## 2022-12-11 MED ORDER — IOHEXOL 350 MG/ML SOLN
75.0000 mL | Freq: Once | INTRAVENOUS | Status: AC | PRN
  Administered 2022-12-11: 75 mL via INTRAVENOUS

## 2022-12-11 NOTE — Code Documentation (Signed)
Eric Gallagher is an 82 yr old male with a PMH of  CAD AF and recent toe surgery presenting to [facility] via EMS on 12/11/2022. Pt is coming from home, where he was LKW today at 1045 while he was doing some wood working in his garage. Around 1100, he called out to his grandson that he didn't feel good and had to sit down. On EMS arrival, pt was bradycardic and altered. He had been incontinent. En route he recovered completely. Pt is on Coumadin, which he last took yesterday.    Pt met at the bridge by the stroke team. Airway cleared by EDP, CBG, labs obtained. Pt to CT with team. NIHSS 0, all symptoms resolved. The following imaging was obtained: CT, CTA. Per Dr. Iver Nestle, CT is neg for hemorrhage, and CTA is neg for LVO.     Pt back to ED room 24 where his workup will continue. He will need q 2 hr VS and NIHSS for 12 hours then q 4. Pt not eligible for TNK as on Coumadin and symptoms resolved. Pt is ineligible for NIR as LVO negative. Bedside handoff with Paramedic, Lyda Perone complete.

## 2022-12-11 NOTE — Consult Note (Signed)
NEUROLOGY CONSULT NOTE   Date of service: December 11, 2022 Patient Name: Eric Gallagher MRN:  [id number] DOB:  1940/04/25 Chief Complaint: "Unresponsive, right sided facial droop" Requesting Provider: Gwyneth Sprout, MD  History of Present Illness  Eric Gallagher is a 82 y.o. male  Might have gotten oxycodone and gabapentin mixed up, bottles were right next to each other  DC Brewing technologist (paramedic), grandson, [phone] (emergency contact and was with patient at time of event)  Toe surgery recently, completely normal surgery and back to baseline  Grandson was visiting with with the patient when the patient suddenly said "I don't feel real good;" was sitting down in a chair Glucose 121, heart rates 82 initially, 220/palpation initial BP, SpO2 low 80s, improved Grandson went to get to get juice to the patient and he was able to drink some of that Cool clammy diaphoretic, confused ("A&Ox2) Twitching in his hands, jumping, possibly slightly decorticate posturing, urinary incontinence Sitting in the chair Pupils pinpoint non reactive, agonal respirations, improved with 2 mg narcan (intranasally, did not improve immediately), bagged briefly As he was coming around, gaze deviation right and right droop  Patient reports he ripped off a piece of skin from his right foot about 3 weeks ago and it became infected requiring surgery approximately 1 week ago.  He has been on oxycodone due to pain.  Lucila Maine has some concern that the patient could have mixed up his oxycodone and gabapentin bottles as they were right next to each other.  Gradually improved during transport with EMS, now back to baseline.  EMS additionally reported that his heart rate was very bradycardic on initial arrival in the 30s, did improve as he woke  LKW: 10:45 AM  Modified rankin score: 0-Completely asymptomatic and back to baseline post- stroke IV Thrombolysis: No, warfarin and back to baseline EVT: No, exam  not c/w LVO   ROS  Comprehensive ROS performed and pertinent positives documented in HPI, limited from patient due to hard of hearing and hearing aids not available  Past History   Past Medical History:  Diagnosis Date   Anginal pain (HCC)    h/o    Arrhythmia    Chronic AFIB, no anti-coag patient desire and low platelet count   Arthritis    both hips    BPH (benign prostatic hyperplasia)    Chronic a-fib (HCC)    Coronary artery disease    NSTEMI in 2005, Taxus DES LAD   Diabetes mellitus without complication (HCC)    type II, some neuropathy   Dysrhythmia    afib   ED (erectile dysfunction)    History of kidney stones 2012   passed spontaneously   Hyperlipidemia    Hypertension    Old MI (myocardial infarction)    Stroke (HCC) 2017   Thrombocytopenia (HCC)    chronic    Past Surgical History:  Procedure Laterality Date   APPENDECTOMY     as a teen   BIOPSY  07/07/2022   Procedure: BIOPSY;  Surgeon: Kathi Der, MD;  Location: [facility] ENDOSCOPY;  Service: Gastroenterology;;   CARDIAC CATHETERIZATION N/A 07/24/2014   Procedure: Right/Left Heart Cath and Coronary Angiography;  Surgeon: Lyn Records, MD;  Location: [facility] INVASIVE CV LAB;  Service: Cardiovascular;  Laterality: N/A;   ESOPHAGOGASTRODUODENOSCOPY (EGD) WITH PROPOFOL N/A 07/07/2022   Procedure: ESOPHAGOGASTRODUODENOSCOPY (EGD) WITH PROPOFOL;  Surgeon: Kathi Der, MD;  Location: [facility] ENDOSCOPY;  Service: Gastroenterology;  Laterality: N/A;   EYE SURGERY Left 2016  retinal surgery, cataracts removed- both eyes   HERNIA REPAIR  1998   L inguinal    I & D EXTREMITY Right 06/18/2014   Procedure: OPEN TREATMENT RIGHT THUMB, INCOMPLETE AMPUTATION.  Washing and dressing applied to left thumb.;  Surgeon: Mack Hook, MD;  Location: [facility] OR;  Service: Orthopedics;  Laterality: Right;   TOTAL HIP ARTHROPLASTY Right 01/04/2018   Procedure: RIGHT TOTAL HIP ARTHROPLASTY ANTERIOR APPROACH;  Surgeon: Kathryne Hitch, MD;  Location: [facility] OR;  Service: Orthopedics;  Laterality: Right;   TOTAL HIP ARTHROPLASTY Left 03/01/2018   Procedure: LEFT TOTAL HIP ARTHROPLASTY ANTERIOR APPROACH;  Surgeon: Kathryne Hitch, MD;  Location: [facility] OR;  Service: Orthopedics;  Laterality: Left;    Family History: Family History  Problem Relation Age of Onset   CVA Mother    Aneurysm Father    Heart disease Brother    Heart disease Brother    Heart attack Maternal Aunt    Heart disease Maternal Aunt     Social History  reports that he quit smoking about 56 years ago. His smoking use included cigarettes. He has never used smokeless tobacco. He reports that he does not drink alcohol and does not use drugs.  Allergies  Allergen Reactions   Flomax [Tamsulosin Hcl] Other (See Comments)    Dizzy    Medications   Current Facility-Administered Medications:    sodium chloride flush (NS) 0.9 % injection 3 mL, 3 mL, Intravenous, Once, Gwyneth Sprout, MD  Current Outpatient Medications:    ACCU-CHEK AVIVA PLUS test strip, Use as directed weekly., Disp: , Rfl:    ACCU-CHEK SOFTCLIX LANCETS lancets, Use as directed weekly., Disp: , Rfl:    alfuzosin (UROXATRAL) 10 MG 24 hr tablet, Take 10 mg by mouth at bedtime., Disp: , Rfl:    amLODipine (NORVASC) 5 MG tablet, Take 5 mg by mouth daily., Disp: , Rfl:    benazepril (LOTENSIN) 20 MG tablet, Take 20 mg by mouth daily., Disp: , Rfl:    finasteride (PROSCAR) 5 MG tablet, Take 5 mg by mouth daily., Disp: , Rfl:    glipiZIDE (GLUCOTROL) 5 MG tablet, Take 2.5 mg by mouth at bedtime., Disp: , Rfl:    metFORMIN (GLUCOPHAGE) 1000 MG tablet, Take 1,000 mg by mouth 2 (two) times daily with a meal., Disp: , Rfl:    metoprolol tartrate (LOPRESSOR) 50 MG tablet, Take 1 tablet (50 mg total) by mouth 2 (two) times daily., Disp: 60 tablet, Rfl: 2   nitroGLYCERIN (NITROSTAT) 0.4 MG SL tablet, Place 1 tablet (0.4 mg total) under the tongue every 5 (five) minutes as needed  for chest pain., Disp: 25 tablet, Rfl: 6   pantoprazole (PROTONIX) 40 MG tablet, Take 1 tablet (40 mg total) by mouth 2 (two) times daily., Disp: 180 tablet, Rfl: 1   simvastatin (ZOCOR) 40 MG tablet, Take 40 mg by mouth every evening., Disp: , Rfl:    sucralfate (CARAFATE) 1 g tablet, Take 1 tablet (1 g total) by mouth 2 (two) times daily after a meal., Disp: 60 tablet, Rfl: 0   warfarin (COUMADIN) 5 MG tablet, Take 1 tablet (5 mg total) by mouth See admin instructions. Take 5 mg by mouth daily except for Saturdays ---Restart Coumadin on Wednesday 06/15/2018, Disp: 30 tablet, Rfl: 1  Vitals   Vitals:   12/11/22 1152  Weight: 94.4 kg  Height: 6\' 2"  (1.88 m)    Body mass index is 26.72 kg/m.  Physical Exam   Constitutional: Appears well-developed  and well-nourished.  Psych: Affect appropriate to situation.  Eyes: No scleral injection.  HENT: No OP obstruction.  Head: Normocephalic.  Cardiovascular: Normal rate and regular rhythm.  Respiratory: Effort normal, non-labored breathing GI: Soft.  No distension. There is no tenderness.  Skin: right foot in boot due to recent surgery  Neurologic Examination   Awake, alert, fully oriented, gives a clear history, no aphasia within limitations of being hard of hearing Visual fields full to confrontation, pupils equal round and reactive, orients to examiner bilaterally, face symmetric, tongue midline, very hard of hearing at baseline No drift in the bilateral upper extremities.  Slight symmetric drift of the bilateral lower extremities Finger-nose intact bilaterally, heel-to-shin limited by weakness and being hard of hearing  NIH 3 for drift in each of the lower extremities and mild dysarthria (baseline)  Labs   CBC:  Recent Labs  Lab 12/11/22 1153 12/11/22 1157  WBC  --  7.2  NEUTROABS  --  5.8  HGB 14.3 13.3  HCT 42.0 40.4  MCV  --  95.5  PLT  --  155    Basic Metabolic Panel:  Lab Results  Component Value Date   NA 134 (L)  12/11/2022   K 5.1 12/11/2022   CO2 22 12/11/2022   GLUCOSE 179 (H) 12/11/2022   BUN 13 12/11/2022   CREATININE 1.05 12/11/2022   CALCIUM 8.9 12/11/2022   GFRNONAA >60 12/11/2022   GFRAA >60 06/11/2018   Lipid Panel: No results found for: "LDLCALC" HgbA1c:  Lab Results  Component Value Date   HGBA1C 6.3 (H) 02/23/2018    INR  Lab Results  Component Value Date   INR 2.1 (H) 12/11/2022   APTT  Lab Results  Component Value Date   APTT 26 12/11/2022    CT Head without contrast(Personally reviewed): No acute intracranial process  CT angio Head and Neck with contrast(Personally reviewed): No LVO on my read, right carotid may be significantly stenotic but only axial thins available at the time of my read so will await formal radiology report for full characterization 1. Partially covered heart shows no enhancement the left atrial appendage. This could be delayed mixing or thrombus in this patient with history of chronic AFib. 2. No emergent arterial finding in the head and neck. 3. 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  Impression   Eric Gallagher is a 82 y.o. male presenting with an episode of loss of consciousness.  Low concern for seizure based on description of the event, urinary incontinence can also be seen in cardiogenic syncope, which is in the differential given his urinary incontinence.  Posterior circulation TIA is also a possibility given the description of pinpoint pupils, but seems less likely given the gradual progression of his symptoms.  However since he is on anticoagulation does need an MRI to confirm safety of continuing anticoagulation.  Low concern for infection given reassuring examination, afebrile, no leukocytosis.  Possibly oversedation secondary to opiate use  Recommendations  Emergent recommendations: -CTA head and neck to clear posterior circulation acute process such as  top of the basilar syndrome  Additional recommendations: -MRI brain (negative on my read, but will follow-up formal radiology report; no further inpatient neurological workup if report is reassuring) -From a stroke perspective patient appears adequately optimized with warfarin, can follow-up outpatient for A1c, lipid panel etc. -Outpatient follow-up for right ICA stenosis -Cardiac and medical clearance per ED -Thank you for involving  [place] in the care of this patient, recommendations conveyed to ED provider via secure chat ______________________________________________________________________    Nunzio Cory MD-PhD [facility] [phone]  CRITICAL CARE Performed by: Gordy Councilman   Total critical care time: 40 minutes  Critical care time was exclusive of separately billable procedures and treating other patients.  Critical care was necessary to treat or prevent imminent or life-threatening deterioration, emergent evaluation for acute neurological changes with consideration of thrombectomy and thrombolytic  Critical care was time spent personally by me on the following activities: development of treatment plan with patient and/or surrogate as well as nursing, discussions with consultants, evaluation of patient's response to treatment, examination of patient, obtaining history from patient or surrogate, ordering and performing treatments and interventions, ordering and review of laboratory studies, ordering and review of radiographic studies, pulse oximetry and re-evaluation of patient's condition.

## 2022-12-11 NOTE — Discharge Instructions (Addendum)
All the blood work today was reassuring.  Your MRI did not show any sign of a new stroke today.  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  When you follow-up with your doctor they need to continue following your hemoglobin A1c and your cholesterol.  If you start having chest pain, palpitations, shortness of breath or any further episodes like this you should return to the emergency room immediately.  Also avoid using any of the strong painkiller oxycodone.  It is fine to continue taking Tylenol, your nerve pill and your antibiotic.

## 2022-12-11 NOTE — ED Provider Notes (Signed)
[place] EMERGENCY DEPARTMENT AT [facility] Provider Note   CSN: [id number] Arrival date & time: 12/11/22  1145  An emergency department physician performed an initial assessment on this suspected stroke patient at 1147.  History  Chief Complaint  Patient presents with   Code Stroke    Eric Gallagher is a 82 y.o. male.  Pt is an 82y/o male with hx of CAD, chronic atrial fibrillation on anticoagulation with Coumadin, chronic thrombocytopenia, recent toe amputation who is presenting today as a code stroke.  Patient reports that he was building something for his cat when he suddenly started feeling very hot and funny.  Patient's grandson called 911 because patient was unresponsive.  When EMS arrived they reported the patient was only responsive to painful stimuli and when fire initially got on the scene because his pupils were pinpoint and he had been given a bottle of oxycodone after his toe amputation they gave him Narcan however there was no response.  When patient did wake up his speech was garbled and they noticed a droop in the right side of his face.  Throughout transport patient has continued to improve and now seems to be back to his baseline.  Patient reports he has not taken any pain medicine for the last 2 days.  He has been eating and drinking okay.  He has only been out of the hospital for 2 days since having his toe cut off.  He has not noticed any fever, abdominal pain, vomiting or diarrhea.  He denies any chest pain or shortness of breath at this time.  He denies headache or visual changes.  The history is provided by the patient, the EMS personnel and medical records.       Home Medications Prior to Admission medications   Medication Sig Start Date End Date Taking? Authorizing Provider  alfuzosin (UROXATRAL) 10 MG 24 hr tablet Take 10 mg by mouth at bedtime. 04/15/16  Yes [provider]  amoxicillin-clavulanate (AUGMENTIN) 875-125 MG tablet Take 1  tablet by mouth 2 (two) times daily. 12/04/22  Yes [provider]  benazepril (LOTENSIN) 20 MG tablet Take 20 mg by mouth at bedtime. 04/19/18  Yes [provider]  finasteride (PROSCAR) 5 MG tablet Take 5 mg by mouth at bedtime. 09/18/21  Yes [provider]  gabapentin (NEURONTIN) 300 MG capsule Take 300 mg by mouth 2 (two) times daily. 12/04/22  Yes [provider]  glipiZIDE (GLUCOTROL) 5 MG tablet Take 2.5 mg by mouth at bedtime.   Yes [provider]  metFORMIN (GLUCOPHAGE) 1000 MG tablet Take 1,000 mg by mouth 2 (two) times daily with a meal.   Yes [provider]  metoprolol tartrate (LOPRESSOR) 50 MG tablet Take 1 tablet (50 mg total) by mouth 2 (two) times daily. 06/12/18  Yes Emokpae, Courage, MD  nitroGLYCERIN (NITROSTAT) 0.4 MG SL tablet Place 1 tablet (0.4 mg total) under the tongue every 5 (five) minutes as needed for chest pain. 09/26/21  Yes Swinyer, Zachary George, NP  oxyCODONE (OXY IR/ROXICODONE) 5 MG immediate release tablet Take 5 mg by mouth every 6 (six) hours as needed for severe pain (pain score 7-10) or moderate pain (pain score 4-6). 12/04/22  Yes [provider]  pantoprazole (PROTONIX) 40 MG tablet Take 1 tablet (40 mg total) by mouth 2 (two) times daily. Patient taking differently: Take 40 mg by mouth daily. 07/07/22 01/03/23 Yes Brahmbhatt, Parag, MD  simvastatin (ZOCOR) 40 MG tablet Take 40 mg  by mouth every evening.   Yes [provider]  warfarin (COUMADIN) 5 MG tablet Take 1 tablet (5 mg total) by mouth See admin instructions. Take 5 mg by mouth daily except for Saturdays ---Restart Coumadin on Wednesday 06/15/2018 Patient taking differently: Take 5 mg by mouth at bedtime. 06/12/18  Yes Shon Hale, MD  ACCU-CHEK AVIVA PLUS test strip Use as directed weekly. 03/07/16   [provider]  ACCU-CHEK SOFTCLIX LANCETS lancets Use as directed weekly. 03/11/16   [provider]       Allergies    Flomax [tamsulosin hcl]    Review of Systems   Review of Systems  Physical Exam Updated Vital Signs BP 131/76   Pulse 67   Temp 98.8 F (37.1 C) (Oral)   Resp 16   Ht 6\' 2"  (1.88 m)   Wt 94.4 kg   SpO2 98%   BMI 26.72 kg/m  Physical Exam Vitals and nursing note reviewed.  Constitutional:      General: He is not in acute distress.    Appearance: He is well-developed.  HENT:     Head: Normocephalic and atraumatic.  Eyes:     Conjunctiva/sclera: Conjunctivae normal.     Pupils: Pupils are equal, round, and reactive to light.  Cardiovascular:     Rate and Rhythm: Normal rate and regular rhythm.     Heart sounds: No murmur heard. Pulmonary:     Effort: Pulmonary effort is normal. No respiratory distress.     Breath sounds: Normal breath sounds. No wheezing or rales.  Abdominal:     General: There is no distension.     Palpations: Abdomen is soft.     Tenderness: There is no abdominal tenderness. There is no guarding or rebound.  Musculoskeletal:        General: No tenderness. Normal range of motion.     Cervical back: Normal range of motion and neck supple.     Comments: Right foot is in a boot at this time  Skin:    General: Skin is warm and dry.     Findings: No erythema or rash.  Neurological:     Mental Status: He is alert and oriented to person, place, and time. Mental status is at baseline.     Cranial Nerves: No dysarthria or facial asymmetry.     Sensory: No sensory deficit.     Motor: Motor function is intact. No pronator drift.     Coordination: Finger-Nose-Finger Test normal.  Psychiatric:        Behavior: Behavior normal.     ED Results / Procedures / Treatments   Labs (all labs ordered are listed, but only abnormal results are displayed) Labs Reviewed  PROTIME-INR - Abnormal; Notable for the following components:      Result Value   Prothrombin Time 23.7 (*)    INR 2.1 (*)    All other components within normal limits   COMPREHENSIVE METABOLIC PANEL - Abnormal; Notable for the following components:   Sodium 134 (*)    Glucose, Bld 179 (*)    All other components within normal limits  I-STAT CHEM 8, ED - Abnormal; Notable for the following components:   Potassium 5.2 (*)    Glucose, Bld 175 (*)    All other components within normal limits  CBG MONITORING, ED - Abnormal; Notable for the following components:   Glucose-Capillary 169 (*)    All other components within normal limits  APTT  CBC  DIFFERENTIAL  ETHANOL  TROPONIN I (HIGH SENSITIVITY)    EKG EKG Interpretation Date/Time:  Friday December 11 2022 12:32:08 EDT Ventricular Rate:  60 PR Interval:    QRS Duration:  82 QT Interval:  443 QTC Calculation: 443 R Axis:   11  Text Interpretation: Atrial fibrillation Minimal ST depression, anterolateral leads No significant change since last tracing Confirmed by Gwyneth Sprout ([id number]) on 12/11/2022 12:52:29 PM  Radiology MR BRAIN WO CONTRAST  Result Date: 12/11/2022 CLINICAL DATA:  Neuro deficit, acute, stroke suspected. EXAM: MRI HEAD WITHOUT CONTRAST TECHNIQUE: Multiplanar, multiecho pulse sequences of the brain and surrounding structures were obtained without intravenous contrast. COMPARISON:  Head CT December 11, 2022. FINDINGS: Brain: No acute infarction, hemorrhage, hydrocephalus, extra-axial collection or mass lesion. Scattered and confluent foci of T2 hyperintensity are seen within the white matter of the cerebral hemispheres and within the pons, nonspecific, most likely related to chronic small vessel ischemia. Moderate parenchymal volume loss. Remote infarcts in the right centrum semiovale, left putamen and bilateral cerebellar hemispheres. Vascular: Normal flow voids. Skull and upper cervical spine: Normal marrow signal. Sinuses/Orbits: Negative. Other: None. IMPRESSION: 1. No acute intracranial abnormality. 2. Advanced chronic microvascular ischemic changes of the white matter. 3. Remote  infarcts in the right centrum semiovale, left putamen and bilateral cerebellar hemispheres. 4. Moderate parenchymal volume loss. Electronically Signed   By: Baldemar Lenis M.D.   On: 12/11/2022 15:23   CT ANGIO HEAD NECK W WO CM  Result Date: 12/11/2022 CLINICAL DATA:  Right facial droop. EXAM: CT ANGIOGRAPHY HEAD AND NECK WITH AND WITHOUT CONTRAST TECHNIQUE: Multidetector CT imaging of the head and neck was performed using the standard protocol during bolus administration of intravenous contrast. Multiplanar CT image reconstructions and MIPs were obtained to evaluate the vascular anatomy. Carotid stenosis measurements (when applicable) are obtained utilizing NASCET criteria, using the distal internal carotid diameter as the denominator. RADIATION DOSE REDUCTION: This exam was performed according to the departmental dose-optimization program which includes automated exposure control, adjustment of the mA and/or kV according to patient size and/or use of iterative reconstruction technique. CONTRAST:  75mL OMNIPAQUE IOHEXOL 350 MG/ML SOLN COMPARISON:  Brain MRI 04/03/2004 FINDINGS: CTA NECK FINDINGS Aortic arch: Atheromatous calcification of the aorta. Three vessel branching. Partial coverage of the heart shows no enhancement in the left atrial appendage. Right carotid system: Portion of the common carotid arteries obscured. Bulky calcified plaque at the bifurcation. 60% atheromatous narrowing at the proximal right ICA. No flow reducing stenosis, ulceration, or beading. Left carotid system: Mixed density plaque at the bifurcation without flow reducing stenosis, ulceration, or beading. Vertebral arteries: Proximal subclavian atherosclerosis without flow reducing stenosis. Calcified plaque at both vertebral origins. Stenosis on the left measures approximate 50%. Stenosis on the right measures approximately 70%. Skeleton: Generalized degenerative endplate and facet spurring. Multilevel ankylosis. Other  neck: No acute finding Upper chest: Clear apical lungs Review of the MIP images confirms the above findings CTA HEAD FINDINGS Anterior circulation: Atheromatous calcification of the cavernous carotids. No branch occlusion, beading, or aneurysm. Posterior circulation: Atheromatous calcification of the right V4 segment causing mild stenosis. The right vertebral artery ends in the right PICA. Diffusely patent basilar. Fetal type right PCA flow. No branch occlusion, beading, or aneurysm. Mild atheromatous irregularity. Venous sinuses: None significant Anatomic variants: As above Review of the MIP images confirms the above findings IMPRESSION: 1. Partially covered heart shows no enhancement the left atrial appendage. This could be delayed mixing or thrombus in this patient with history  of chronic AFib. 2. No emergent arterial finding in the head and neck. 3. 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 Electronically Signed   By: Tiburcio Pea M.D.   On: 12/11/2022 13:09   CT HEAD CODE STROKE WO CONTRAST  Result Date: 12/11/2022 CLINICAL DATA:  Code stroke.  Neuro deficit, acute, stroke suspected EXAM: CT HEAD WITHOUT CONTRAST TECHNIQUE: Contiguous axial images were obtained from the base of the skull through the vertex without intravenous contrast. RADIATION DOSE REDUCTION: This exam was performed according to the departmental dose-optimization program which includes automated exposure control, adjustment of the mA and/or kV according to patient size and/or use of iterative reconstruction technique. COMPARISON:  MRI head April 03, 2004 FINDINGS: Brain: No evidence of acute large vascular territory infarction, hemorrhage, hydrocephalus, extra-axial collection or mass lesion/mass effect. Patchy white matter hypodensities are nonspecific but compatible with chronic microvascular ischemic. Vascular: No hyperdense vessel.  Calcific  atherosclerosis. Skull: Normal. Negative for fracture or focal lesion. Sinuses/Orbits: No acute finding. ASPECTS [facility] Stroke Program Early CT Score) total score (0-10 with 10 being normal): 10. IMPRESSION: 1. No evidence of acute large vascular territory infarct or acute hemorrhage. 2. ASPECTS is 10. Electronically Signed   By: Feliberto Harts M.D.   On: 12/11/2022 12:12    Procedures Procedures    Medications Ordered in ED Medications  iohexol (OMNIPAQUE) 350 MG/ML injection 75 mL (has no administration in time range)  sodium chloride flush (NS) 0.9 % injection 3 mL (3 mLs Intravenous Given 12/11/22 1234)  iohexol (OMNIPAQUE) 350 MG/ML injection 75 mL (75 mLs Intravenous Contrast Given 12/11/22 1204)    ED Course/ Medical Decision Making/ A&P                                 Medical Decision Making Amount and/or Complexity of Data Reviewed Labs: ordered. Decision-making details documented in ED Course. Radiology: ordered and independent interpretation performed. Decision-making details documented in ED Course.  Risk Prescription drug management.   Pt with multiple medical problems and comorbidities and presenting today with a complaint that caries a high risk for morbidity and mortality.  Here today as a code stroke as he was last seen normal at 1045 this morning before this episode occurred.  Now currently at baseline.  Patient is not a TNKase candidate as he is chronically anticoagulated and currently has an NIH of 0.  Concern for possible TIA versus syncopal event versus new electrolyte abnormalities given recent hospitalization.  No evidence of hypoglycemia causing his symptoms.  EMS did state that his initial blood pressure was a little bit difficult to get but most recent pressure was 118/70.  Neurology team present upon patient's arrival.  Patient went to the scanner for further evaluation.  I independently interpreted patient's labs and EKG.  Patient's Chem-8 with minimal  hyperkalemia 5.2 but otherwise normal, blood sugar 169, CBC within normal limits, INR therapuetic at 2.1, CMP wnl.  EKG today shows slow a.fib but otherwise unchanged. I have independently visualized and interpreted pt's images today.  CT head without findings for stroke or bleed.  Agree with radiology who report no acute findings.   Speaking with Dr. Iver Nestle they feel patient's symptoms are most likely from a syncopal event today but want to rule out posterior circulation because.  Low suspicion for seizure based on description of episode today.  They are recommending  MRI and CTA but if those are normal he can follow-up as an outpatient to have his A1c and lipids checked as an outpatient from a neurological standpoint.   3:56 PM Patient's MRI is negative for acute abnormalities today.  On reevaluation patient is feeling well and has no complaints.  His grandson is now present at bedside and states that he was there when this happened and he started complaining of not feeling well and then was unresponsive.  His grandson states that initially they did not get the Narcan and but shortly after he received it he started to improve significantly.  It is unclear if he accidentally had oxycodone but the patient states he has not had any in 2 days.  His grandson is a paramedic and also reports he had a strong steady pulse throughout this event.  His heart rates were in the 70s and 80s when EMS arrived and lower suspicion for dysrhythmia although he does not have a pacemaker or any way to confirm this.  At this time feel that patient can be discharged home.  Did discuss with them removing Percocet from the home just in case as well as remaining hydrated and any further episodes he needs to return to the hospital immediately.  They are comfortable with this plan.  No show's are determinants affecting his discharge today.        Final Clinical Impression(s) / ED Diagnoses Final diagnoses:  Loss of consciousness  [facility])    Rx / DC Orders ED Discharge Orders     None         Gwyneth Sprout, MD 12/11/22 1556

## 2022-12-11 NOTE — ED Triage Notes (Signed)
Code stroke. LKW at 1045AM. Pt was initially unresponsive and had pinpoint pupils. Fire department gave 2mg  Narcan. Pt then became responsive to painful stimuli and woke up with right facial droop and garbled speech. Symptoms resolved. Pt states he has not taken his oxycodone in 2 days. CBG was 159.

## 2022-12-15 DIAGNOSIS — L03031 Cellulitis of right toe: Secondary | ICD-10-CM | POA: Diagnosis not present

## 2022-12-15 DIAGNOSIS — I10 Essential (primary) hypertension: Secondary | ICD-10-CM | POA: Diagnosis not present

## 2022-12-15 DIAGNOSIS — E1140 Type 2 diabetes mellitus with diabetic neuropathy, unspecified: Secondary | ICD-10-CM | POA: Diagnosis not present

## 2022-12-15 DIAGNOSIS — I4821 Permanent atrial fibrillation: Secondary | ICD-10-CM | POA: Diagnosis not present

## 2022-12-15 DIAGNOSIS — E782 Mixed hyperlipidemia: Secondary | ICD-10-CM | POA: Diagnosis not present

## 2022-12-15 DIAGNOSIS — I252 Old myocardial infarction: Secondary | ICD-10-CM | POA: Diagnosis not present

## 2022-12-15 DIAGNOSIS — E1151 Type 2 diabetes mellitus with diabetic peripheral angiopathy without gangrene: Secondary | ICD-10-CM | POA: Diagnosis not present

## 2022-12-15 DIAGNOSIS — M19071 Primary osteoarthritis, right ankle and foot: Secondary | ICD-10-CM | POA: Diagnosis not present

## 2022-12-15 DIAGNOSIS — B9561 Methicillin susceptible Staphylococcus aureus infection as the cause of diseases classified elsewhere: Secondary | ICD-10-CM | POA: Diagnosis not present

## 2022-12-15 DIAGNOSIS — I69354 Hemiplegia and hemiparesis following cerebral infarction affecting left non-dominant side: Secondary | ICD-10-CM | POA: Diagnosis not present

## 2022-12-15 DIAGNOSIS — E538 Deficiency of other specified B group vitamins: Secondary | ICD-10-CM | POA: Diagnosis not present

## 2022-12-15 DIAGNOSIS — I2510 Atherosclerotic heart disease of native coronary artery without angina pectoris: Secondary | ICD-10-CM | POA: Diagnosis not present

## 2022-12-15 DIAGNOSIS — K2210 Ulcer of esophagus without bleeding: Secondary | ICD-10-CM | POA: Diagnosis not present

## 2022-12-15 DIAGNOSIS — D696 Thrombocytopenia, unspecified: Secondary | ICD-10-CM | POA: Diagnosis not present

## 2022-12-15 DIAGNOSIS — M7731 Calcaneal spur, right foot: Secondary | ICD-10-CM | POA: Diagnosis not present

## 2022-12-15 DIAGNOSIS — Z89411 Acquired absence of right great toe: Secondary | ICD-10-CM | POA: Diagnosis not present

## 2022-12-15 DIAGNOSIS — Z5181 Encounter for therapeutic drug level monitoring: Secondary | ICD-10-CM | POA: Diagnosis not present

## 2022-12-15 DIAGNOSIS — Z792 Long term (current) use of antibiotics: Secondary | ICD-10-CM | POA: Diagnosis not present

## 2022-12-15 DIAGNOSIS — K5790 Diverticulosis of intestine, part unspecified, without perforation or abscess without bleeding: Secondary | ICD-10-CM | POA: Diagnosis not present

## 2022-12-15 DIAGNOSIS — I700 Atherosclerosis of aorta: Secondary | ICD-10-CM | POA: Diagnosis not present

## 2022-12-15 DIAGNOSIS — D6869 Other thrombophilia: Secondary | ICD-10-CM | POA: Diagnosis not present

## 2022-12-15 DIAGNOSIS — Z4781 Encounter for orthopedic aftercare following surgical amputation: Secondary | ICD-10-CM | POA: Diagnosis not present

## 2022-12-15 DIAGNOSIS — I8393 Asymptomatic varicose veins of bilateral lower extremities: Secondary | ICD-10-CM | POA: Diagnosis not present

## 2022-12-15 DIAGNOSIS — Z7984 Long term (current) use of oral hypoglycemic drugs: Secondary | ICD-10-CM | POA: Diagnosis not present

## 2022-12-17 DIAGNOSIS — I700 Atherosclerosis of aorta: Secondary | ICD-10-CM | POA: Diagnosis not present

## 2022-12-17 DIAGNOSIS — Z89411 Acquired absence of right great toe: Secondary | ICD-10-CM | POA: Diagnosis not present

## 2022-12-17 DIAGNOSIS — M7731 Calcaneal spur, right foot: Secondary | ICD-10-CM | POA: Diagnosis not present

## 2022-12-17 DIAGNOSIS — I8393 Asymptomatic varicose veins of bilateral lower extremities: Secondary | ICD-10-CM | POA: Diagnosis not present

## 2022-12-17 DIAGNOSIS — L03031 Cellulitis of right toe: Secondary | ICD-10-CM | POA: Diagnosis not present

## 2022-12-17 DIAGNOSIS — I10 Essential (primary) hypertension: Secondary | ICD-10-CM | POA: Diagnosis not present

## 2022-12-17 DIAGNOSIS — I69354 Hemiplegia and hemiparesis following cerebral infarction affecting left non-dominant side: Secondary | ICD-10-CM | POA: Diagnosis not present

## 2022-12-17 DIAGNOSIS — D696 Thrombocytopenia, unspecified: Secondary | ICD-10-CM | POA: Diagnosis not present

## 2022-12-17 DIAGNOSIS — Z5181 Encounter for therapeutic drug level monitoring: Secondary | ICD-10-CM | POA: Diagnosis not present

## 2022-12-17 DIAGNOSIS — E1140 Type 2 diabetes mellitus with diabetic neuropathy, unspecified: Secondary | ICD-10-CM | POA: Diagnosis not present

## 2022-12-17 DIAGNOSIS — I2510 Atherosclerotic heart disease of native coronary artery without angina pectoris: Secondary | ICD-10-CM | POA: Diagnosis not present

## 2022-12-17 DIAGNOSIS — K2210 Ulcer of esophagus without bleeding: Secondary | ICD-10-CM | POA: Diagnosis not present

## 2022-12-17 DIAGNOSIS — Z7984 Long term (current) use of oral hypoglycemic drugs: Secondary | ICD-10-CM | POA: Diagnosis not present

## 2022-12-17 DIAGNOSIS — Z792 Long term (current) use of antibiotics: Secondary | ICD-10-CM | POA: Diagnosis not present

## 2022-12-17 DIAGNOSIS — K5790 Diverticulosis of intestine, part unspecified, without perforation or abscess without bleeding: Secondary | ICD-10-CM | POA: Diagnosis not present

## 2022-12-17 DIAGNOSIS — M19071 Primary osteoarthritis, right ankle and foot: Secondary | ICD-10-CM | POA: Diagnosis not present

## 2022-12-17 DIAGNOSIS — I252 Old myocardial infarction: Secondary | ICD-10-CM | POA: Diagnosis not present

## 2022-12-17 DIAGNOSIS — Z4781 Encounter for orthopedic aftercare following surgical amputation: Secondary | ICD-10-CM | POA: Diagnosis not present

## 2022-12-17 DIAGNOSIS — I4821 Permanent atrial fibrillation: Secondary | ICD-10-CM | POA: Diagnosis not present

## 2022-12-17 DIAGNOSIS — B9561 Methicillin susceptible Staphylococcus aureus infection as the cause of diseases classified elsewhere: Secondary | ICD-10-CM | POA: Diagnosis not present

## 2022-12-17 DIAGNOSIS — D6869 Other thrombophilia: Secondary | ICD-10-CM | POA: Diagnosis not present

## 2022-12-17 DIAGNOSIS — E1151 Type 2 diabetes mellitus with diabetic peripheral angiopathy without gangrene: Secondary | ICD-10-CM | POA: Diagnosis not present

## 2022-12-17 DIAGNOSIS — E538 Deficiency of other specified B group vitamins: Secondary | ICD-10-CM | POA: Diagnosis not present

## 2022-12-17 DIAGNOSIS — E782 Mixed hyperlipidemia: Secondary | ICD-10-CM | POA: Diagnosis not present

## 2022-12-18 DIAGNOSIS — I499 Cardiac arrhythmia, unspecified: Secondary | ICD-10-CM | POA: Diagnosis not present

## 2022-12-18 DIAGNOSIS — R42 Dizziness and giddiness: Secondary | ICD-10-CM | POA: Diagnosis not present

## 2022-12-18 DIAGNOSIS — R9431 Abnormal electrocardiogram [ECG] [EKG]: Secondary | ICD-10-CM | POA: Diagnosis not present

## 2022-12-18 DIAGNOSIS — R404 Transient alteration of awareness: Secondary | ICD-10-CM | POA: Diagnosis not present

## 2022-12-18 DIAGNOSIS — R55 Syncope and collapse: Secondary | ICD-10-CM | POA: Diagnosis not present

## 2022-12-18 DIAGNOSIS — I4891 Unspecified atrial fibrillation: Secondary | ICD-10-CM | POA: Diagnosis not present

## 2022-12-18 DIAGNOSIS — I10 Essential (primary) hypertension: Secondary | ICD-10-CM | POA: Diagnosis not present

## 2022-12-18 DIAGNOSIS — Z743 Need for continuous supervision: Secondary | ICD-10-CM | POA: Diagnosis not present

## 2022-12-18 DIAGNOSIS — E119 Type 2 diabetes mellitus without complications: Secondary | ICD-10-CM | POA: Diagnosis not present

## 2022-12-18 DIAGNOSIS — R918 Other nonspecific abnormal finding of lung field: Secondary | ICD-10-CM | POA: Diagnosis not present

## 2022-12-18 DIAGNOSIS — R531 Weakness: Secondary | ICD-10-CM | POA: Diagnosis not present

## 2022-12-18 DIAGNOSIS — R6889 Other general symptoms and signs: Secondary | ICD-10-CM | POA: Diagnosis not present

## 2022-12-22 DIAGNOSIS — B9561 Methicillin susceptible Staphylococcus aureus infection as the cause of diseases classified elsewhere: Secondary | ICD-10-CM | POA: Diagnosis not present

## 2022-12-22 DIAGNOSIS — I4821 Permanent atrial fibrillation: Secondary | ICD-10-CM | POA: Diagnosis not present

## 2022-12-22 DIAGNOSIS — E782 Mixed hyperlipidemia: Secondary | ICD-10-CM | POA: Diagnosis not present

## 2022-12-22 DIAGNOSIS — Z4781 Encounter for orthopedic aftercare following surgical amputation: Secondary | ICD-10-CM | POA: Diagnosis not present

## 2022-12-22 DIAGNOSIS — E538 Deficiency of other specified B group vitamins: Secondary | ICD-10-CM | POA: Diagnosis not present

## 2022-12-22 DIAGNOSIS — M7731 Calcaneal spur, right foot: Secondary | ICD-10-CM | POA: Diagnosis not present

## 2022-12-22 DIAGNOSIS — I10 Essential (primary) hypertension: Secondary | ICD-10-CM | POA: Diagnosis not present

## 2022-12-22 DIAGNOSIS — Z5181 Encounter for therapeutic drug level monitoring: Secondary | ICD-10-CM | POA: Diagnosis not present

## 2022-12-22 DIAGNOSIS — I8393 Asymptomatic varicose veins of bilateral lower extremities: Secondary | ICD-10-CM | POA: Diagnosis not present

## 2022-12-22 DIAGNOSIS — L03031 Cellulitis of right toe: Secondary | ICD-10-CM | POA: Diagnosis not present

## 2022-12-22 DIAGNOSIS — D6869 Other thrombophilia: Secondary | ICD-10-CM | POA: Diagnosis not present

## 2022-12-22 DIAGNOSIS — M19071 Primary osteoarthritis, right ankle and foot: Secondary | ICD-10-CM | POA: Diagnosis not present

## 2022-12-22 DIAGNOSIS — Z7984 Long term (current) use of oral hypoglycemic drugs: Secondary | ICD-10-CM | POA: Diagnosis not present

## 2022-12-22 DIAGNOSIS — D696 Thrombocytopenia, unspecified: Secondary | ICD-10-CM | POA: Diagnosis not present

## 2022-12-22 DIAGNOSIS — E1140 Type 2 diabetes mellitus with diabetic neuropathy, unspecified: Secondary | ICD-10-CM | POA: Diagnosis not present

## 2022-12-22 DIAGNOSIS — Z89411 Acquired absence of right great toe: Secondary | ICD-10-CM | POA: Diagnosis not present

## 2022-12-22 DIAGNOSIS — Z792 Long term (current) use of antibiotics: Secondary | ICD-10-CM | POA: Diagnosis not present

## 2022-12-22 DIAGNOSIS — K5790 Diverticulosis of intestine, part unspecified, without perforation or abscess without bleeding: Secondary | ICD-10-CM | POA: Diagnosis not present

## 2022-12-22 DIAGNOSIS — I2510 Atherosclerotic heart disease of native coronary artery without angina pectoris: Secondary | ICD-10-CM | POA: Diagnosis not present

## 2022-12-22 DIAGNOSIS — I700 Atherosclerosis of aorta: Secondary | ICD-10-CM | POA: Diagnosis not present

## 2022-12-22 DIAGNOSIS — E1151 Type 2 diabetes mellitus with diabetic peripheral angiopathy without gangrene: Secondary | ICD-10-CM | POA: Diagnosis not present

## 2022-12-22 DIAGNOSIS — I252 Old myocardial infarction: Secondary | ICD-10-CM | POA: Diagnosis not present

## 2022-12-22 DIAGNOSIS — I69354 Hemiplegia and hemiparesis following cerebral infarction affecting left non-dominant side: Secondary | ICD-10-CM | POA: Diagnosis not present

## 2022-12-22 DIAGNOSIS — K2210 Ulcer of esophagus without bleeding: Secondary | ICD-10-CM | POA: Diagnosis not present

## 2022-12-28 DIAGNOSIS — Z7901 Long term (current) use of anticoagulants: Secondary | ICD-10-CM | POA: Diagnosis not present

## 2022-12-28 DIAGNOSIS — I69352 Hemiplegia and hemiparesis following cerebral infarction affecting left dominant side: Secondary | ICD-10-CM | POA: Diagnosis not present

## 2022-12-28 DIAGNOSIS — Z9989 Dependence on other enabling machines and devices: Secondary | ICD-10-CM | POA: Diagnosis not present

## 2022-12-28 DIAGNOSIS — R55 Syncope and collapse: Secondary | ICD-10-CM | POA: Diagnosis not present

## 2022-12-28 DIAGNOSIS — Z89421 Acquired absence of other right toe(s): Secondary | ICD-10-CM | POA: Diagnosis not present

## 2022-12-28 DIAGNOSIS — E538 Deficiency of other specified B group vitamins: Secondary | ICD-10-CM | POA: Diagnosis not present

## 2022-12-28 DIAGNOSIS — R269 Unspecified abnormalities of gait and mobility: Secondary | ICD-10-CM | POA: Diagnosis not present

## 2022-12-28 DIAGNOSIS — I4821 Permanent atrial fibrillation: Secondary | ICD-10-CM | POA: Diagnosis not present

## 2022-12-28 DIAGNOSIS — E1140 Type 2 diabetes mellitus with diabetic neuropathy, unspecified: Secondary | ICD-10-CM | POA: Diagnosis not present

## 2022-12-28 DIAGNOSIS — D6869 Other thrombophilia: Secondary | ICD-10-CM | POA: Diagnosis not present

## 2022-12-28 DIAGNOSIS — I700 Atherosclerosis of aorta: Secondary | ICD-10-CM | POA: Diagnosis not present

## 2022-12-30 DIAGNOSIS — M7731 Calcaneal spur, right foot: Secondary | ICD-10-CM | POA: Diagnosis not present

## 2022-12-30 DIAGNOSIS — Z89411 Acquired absence of right great toe: Secondary | ICD-10-CM | POA: Diagnosis not present

## 2022-12-30 DIAGNOSIS — I8393 Asymptomatic varicose veins of bilateral lower extremities: Secondary | ICD-10-CM | POA: Diagnosis not present

## 2022-12-30 DIAGNOSIS — M19071 Primary osteoarthritis, right ankle and foot: Secondary | ICD-10-CM | POA: Diagnosis not present

## 2022-12-30 DIAGNOSIS — Z7984 Long term (current) use of oral hypoglycemic drugs: Secondary | ICD-10-CM | POA: Diagnosis not present

## 2022-12-30 DIAGNOSIS — I2510 Atherosclerotic heart disease of native coronary artery without angina pectoris: Secondary | ICD-10-CM | POA: Diagnosis not present

## 2022-12-30 DIAGNOSIS — D6869 Other thrombophilia: Secondary | ICD-10-CM | POA: Diagnosis not present

## 2022-12-30 DIAGNOSIS — Z4781 Encounter for orthopedic aftercare following surgical amputation: Secondary | ICD-10-CM | POA: Diagnosis not present

## 2022-12-30 DIAGNOSIS — I700 Atherosclerosis of aorta: Secondary | ICD-10-CM | POA: Diagnosis not present

## 2022-12-30 DIAGNOSIS — Z5181 Encounter for therapeutic drug level monitoring: Secondary | ICD-10-CM | POA: Diagnosis not present

## 2022-12-30 DIAGNOSIS — K2210 Ulcer of esophagus without bleeding: Secondary | ICD-10-CM | POA: Diagnosis not present

## 2022-12-30 DIAGNOSIS — Z792 Long term (current) use of antibiotics: Secondary | ICD-10-CM | POA: Diagnosis not present

## 2022-12-30 DIAGNOSIS — L03031 Cellulitis of right toe: Secondary | ICD-10-CM | POA: Diagnosis not present

## 2022-12-30 DIAGNOSIS — E1151 Type 2 diabetes mellitus with diabetic peripheral angiopathy without gangrene: Secondary | ICD-10-CM | POA: Diagnosis not present

## 2022-12-30 DIAGNOSIS — E782 Mixed hyperlipidemia: Secondary | ICD-10-CM | POA: Diagnosis not present

## 2022-12-30 DIAGNOSIS — I252 Old myocardial infarction: Secondary | ICD-10-CM | POA: Diagnosis not present

## 2022-12-30 DIAGNOSIS — E538 Deficiency of other specified B group vitamins: Secondary | ICD-10-CM | POA: Diagnosis not present

## 2022-12-30 DIAGNOSIS — K5790 Diverticulosis of intestine, part unspecified, without perforation or abscess without bleeding: Secondary | ICD-10-CM | POA: Diagnosis not present

## 2022-12-30 DIAGNOSIS — D696 Thrombocytopenia, unspecified: Secondary | ICD-10-CM | POA: Diagnosis not present

## 2022-12-30 DIAGNOSIS — I10 Essential (primary) hypertension: Secondary | ICD-10-CM | POA: Diagnosis not present

## 2022-12-30 DIAGNOSIS — I69354 Hemiplegia and hemiparesis following cerebral infarction affecting left non-dominant side: Secondary | ICD-10-CM | POA: Diagnosis not present

## 2022-12-30 DIAGNOSIS — B9561 Methicillin susceptible Staphylococcus aureus infection as the cause of diseases classified elsewhere: Secondary | ICD-10-CM | POA: Diagnosis not present

## 2022-12-30 DIAGNOSIS — E1140 Type 2 diabetes mellitus with diabetic neuropathy, unspecified: Secondary | ICD-10-CM | POA: Diagnosis not present

## 2022-12-30 DIAGNOSIS — I4821 Permanent atrial fibrillation: Secondary | ICD-10-CM | POA: Diagnosis not present

## 2023-01-06 DIAGNOSIS — M19071 Primary osteoarthritis, right ankle and foot: Secondary | ICD-10-CM | POA: Diagnosis not present

## 2023-01-06 DIAGNOSIS — D696 Thrombocytopenia, unspecified: Secondary | ICD-10-CM | POA: Diagnosis not present

## 2023-01-06 DIAGNOSIS — I10 Essential (primary) hypertension: Secondary | ICD-10-CM | POA: Diagnosis not present

## 2023-01-06 DIAGNOSIS — E538 Deficiency of other specified B group vitamins: Secondary | ICD-10-CM | POA: Diagnosis not present

## 2023-01-06 DIAGNOSIS — B9561 Methicillin susceptible Staphylococcus aureus infection as the cause of diseases classified elsewhere: Secondary | ICD-10-CM | POA: Diagnosis not present

## 2023-01-06 DIAGNOSIS — D6869 Other thrombophilia: Secondary | ICD-10-CM | POA: Diagnosis not present

## 2023-01-06 DIAGNOSIS — I69354 Hemiplegia and hemiparesis following cerebral infarction affecting left non-dominant side: Secondary | ICD-10-CM | POA: Diagnosis not present

## 2023-01-06 DIAGNOSIS — E1140 Type 2 diabetes mellitus with diabetic neuropathy, unspecified: Secondary | ICD-10-CM | POA: Diagnosis not present

## 2023-01-06 DIAGNOSIS — K5790 Diverticulosis of intestine, part unspecified, without perforation or abscess without bleeding: Secondary | ICD-10-CM | POA: Diagnosis not present

## 2023-01-06 DIAGNOSIS — Z5181 Encounter for therapeutic drug level monitoring: Secondary | ICD-10-CM | POA: Diagnosis not present

## 2023-01-06 DIAGNOSIS — I8393 Asymptomatic varicose veins of bilateral lower extremities: Secondary | ICD-10-CM | POA: Diagnosis not present

## 2023-01-06 DIAGNOSIS — Z4781 Encounter for orthopedic aftercare following surgical amputation: Secondary | ICD-10-CM | POA: Diagnosis not present

## 2023-01-06 DIAGNOSIS — E782 Mixed hyperlipidemia: Secondary | ICD-10-CM | POA: Diagnosis not present

## 2023-01-06 DIAGNOSIS — Z89411 Acquired absence of right great toe: Secondary | ICD-10-CM | POA: Diagnosis not present

## 2023-01-06 DIAGNOSIS — E1151 Type 2 diabetes mellitus with diabetic peripheral angiopathy without gangrene: Secondary | ICD-10-CM | POA: Diagnosis not present

## 2023-01-06 DIAGNOSIS — I252 Old myocardial infarction: Secondary | ICD-10-CM | POA: Diagnosis not present

## 2023-01-06 DIAGNOSIS — I700 Atherosclerosis of aorta: Secondary | ICD-10-CM | POA: Diagnosis not present

## 2023-01-06 DIAGNOSIS — M7731 Calcaneal spur, right foot: Secondary | ICD-10-CM | POA: Diagnosis not present

## 2023-01-06 DIAGNOSIS — L03031 Cellulitis of right toe: Secondary | ICD-10-CM | POA: Diagnosis not present

## 2023-01-06 DIAGNOSIS — Z792 Long term (current) use of antibiotics: Secondary | ICD-10-CM | POA: Diagnosis not present

## 2023-01-06 DIAGNOSIS — K2210 Ulcer of esophagus without bleeding: Secondary | ICD-10-CM | POA: Diagnosis not present

## 2023-01-06 DIAGNOSIS — Z7984 Long term (current) use of oral hypoglycemic drugs: Secondary | ICD-10-CM | POA: Diagnosis not present

## 2023-01-06 DIAGNOSIS — I2510 Atherosclerotic heart disease of native coronary artery without angina pectoris: Secondary | ICD-10-CM | POA: Diagnosis not present

## 2023-01-06 DIAGNOSIS — I4821 Permanent atrial fibrillation: Secondary | ICD-10-CM | POA: Diagnosis not present

## 2023-01-28 DIAGNOSIS — Z7901 Long term (current) use of anticoagulants: Secondary | ICD-10-CM | POA: Diagnosis not present

## 2023-01-28 DIAGNOSIS — E538 Deficiency of other specified B group vitamins: Secondary | ICD-10-CM | POA: Diagnosis not present

## 2023-02-09 DIAGNOSIS — Z89421 Acquired absence of other right toe(s): Secondary | ICD-10-CM | POA: Diagnosis not present

## 2023-02-26 DIAGNOSIS — R269 Unspecified abnormalities of gait and mobility: Secondary | ICD-10-CM | POA: Diagnosis not present

## 2023-02-26 DIAGNOSIS — I69352 Hemiplegia and hemiparesis following cerebral infarction affecting left dominant side: Secondary | ICD-10-CM | POA: Diagnosis not present

## 2023-02-26 DIAGNOSIS — I4821 Permanent atrial fibrillation: Secondary | ICD-10-CM | POA: Diagnosis not present

## 2023-02-26 DIAGNOSIS — I10 Essential (primary) hypertension: Secondary | ICD-10-CM | POA: Diagnosis not present

## 2023-02-26 DIAGNOSIS — I77810 Thoracic aortic ectasia: Secondary | ICD-10-CM | POA: Diagnosis not present

## 2023-02-26 DIAGNOSIS — E538 Deficiency of other specified B group vitamins: Secondary | ICD-10-CM | POA: Diagnosis not present

## 2023-02-26 DIAGNOSIS — D696 Thrombocytopenia, unspecified: Secondary | ICD-10-CM | POA: Diagnosis not present

## 2023-02-26 DIAGNOSIS — Z7901 Long term (current) use of anticoagulants: Secondary | ICD-10-CM | POA: Diagnosis not present

## 2023-02-26 DIAGNOSIS — E1140 Type 2 diabetes mellitus with diabetic neuropathy, unspecified: Secondary | ICD-10-CM | POA: Diagnosis not present

## 2023-02-26 DIAGNOSIS — Z89429 Acquired absence of other toe(s), unspecified side: Secondary | ICD-10-CM | POA: Diagnosis not present

## 2023-02-26 DIAGNOSIS — D6869 Other thrombophilia: Secondary | ICD-10-CM | POA: Diagnosis not present

## 2023-03-08 DIAGNOSIS — Z89411 Acquired absence of right great toe: Secondary | ICD-10-CM | POA: Diagnosis not present

## 2023-03-08 DIAGNOSIS — Z9889 Other specified postprocedural states: Secondary | ICD-10-CM | POA: Diagnosis not present

## 2023-03-08 DIAGNOSIS — M79671 Pain in right foot: Secondary | ICD-10-CM | POA: Diagnosis not present

## 2023-03-09 DIAGNOSIS — H524 Presbyopia: Secondary | ICD-10-CM | POA: Diagnosis not present

## 2023-03-29 DIAGNOSIS — Z7901 Long term (current) use of anticoagulants: Secondary | ICD-10-CM | POA: Diagnosis not present

## 2023-04-29 DIAGNOSIS — E538 Deficiency of other specified B group vitamins: Secondary | ICD-10-CM | POA: Diagnosis not present

## 2023-04-29 DIAGNOSIS — Z7901 Long term (current) use of anticoagulants: Secondary | ICD-10-CM | POA: Diagnosis not present

## 2023-05-31 DIAGNOSIS — Z7901 Long term (current) use of anticoagulants: Secondary | ICD-10-CM | POA: Diagnosis not present

## 2023-05-31 DIAGNOSIS — E538 Deficiency of other specified B group vitamins: Secondary | ICD-10-CM | POA: Diagnosis not present

## 2023-07-01 DIAGNOSIS — Z7901 Long term (current) use of anticoagulants: Secondary | ICD-10-CM | POA: Diagnosis not present

## 2023-07-01 DIAGNOSIS — E538 Deficiency of other specified B group vitamins: Secondary | ICD-10-CM | POA: Diagnosis not present

## 2023-08-02 DIAGNOSIS — E538 Deficiency of other specified B group vitamins: Secondary | ICD-10-CM | POA: Diagnosis not present

## 2023-08-02 DIAGNOSIS — Z7901 Long term (current) use of anticoagulants: Secondary | ICD-10-CM | POA: Diagnosis not present

## 2023-08-03 DIAGNOSIS — E1140 Type 2 diabetes mellitus with diabetic neuropathy, unspecified: Secondary | ICD-10-CM | POA: Diagnosis not present

## 2023-08-03 DIAGNOSIS — B351 Tinea unguium: Secondary | ICD-10-CM | POA: Diagnosis not present

## 2023-08-20 ENCOUNTER — Ambulatory Visit: Admitting: Podiatry

## 2023-08-20 DIAGNOSIS — D6869 Other thrombophilia: Secondary | ICD-10-CM | POA: Insufficient documentation

## 2023-08-20 DIAGNOSIS — B351 Tinea unguium: Secondary | ICD-10-CM | POA: Insufficient documentation

## 2023-08-20 DIAGNOSIS — E538 Deficiency of other specified B group vitamins: Secondary | ICD-10-CM | POA: Insufficient documentation

## 2023-08-20 DIAGNOSIS — R209 Unspecified disturbances of skin sensation: Secondary | ICD-10-CM | POA: Insufficient documentation

## 2023-08-20 DIAGNOSIS — K2210 Ulcer of esophagus without bleeding: Secondary | ICD-10-CM | POA: Insufficient documentation

## 2023-08-20 DIAGNOSIS — S98131A Complete traumatic amputation of one right lesser toe, initial encounter: Secondary | ICD-10-CM | POA: Insufficient documentation

## 2023-08-20 DIAGNOSIS — Z89419 Acquired absence of unspecified great toe: Secondary | ICD-10-CM | POA: Diagnosis not present

## 2023-08-20 DIAGNOSIS — E7800 Pure hypercholesterolemia, unspecified: Secondary | ICD-10-CM | POA: Insufficient documentation

## 2023-08-20 DIAGNOSIS — Z7901 Long term (current) use of anticoagulants: Secondary | ICD-10-CM | POA: Diagnosis not present

## 2023-08-20 DIAGNOSIS — R269 Unspecified abnormalities of gait and mobility: Secondary | ICD-10-CM | POA: Insufficient documentation

## 2023-08-20 DIAGNOSIS — M79674 Pain in right toe(s): Secondary | ICD-10-CM | POA: Diagnosis not present

## 2023-08-20 DIAGNOSIS — M2041 Other hammer toe(s) (acquired), right foot: Secondary | ICD-10-CM

## 2023-08-20 DIAGNOSIS — L84 Corns and callosities: Secondary | ICD-10-CM

## 2023-08-20 DIAGNOSIS — D696 Thrombocytopenia, unspecified: Secondary | ICD-10-CM | POA: Insufficient documentation

## 2023-08-20 DIAGNOSIS — E785 Hyperlipidemia, unspecified: Secondary | ICD-10-CM | POA: Insufficient documentation

## 2023-08-20 DIAGNOSIS — M79675 Pain in left toe(s): Secondary | ICD-10-CM | POA: Diagnosis not present

## 2023-08-20 DIAGNOSIS — N400 Enlarged prostate without lower urinary tract symptoms: Secondary | ICD-10-CM | POA: Insufficient documentation

## 2023-08-20 DIAGNOSIS — N529 Male erectile dysfunction, unspecified: Secondary | ICD-10-CM | POA: Insufficient documentation

## 2023-08-20 DIAGNOSIS — I69959 Hemiplegia and hemiparesis following unspecified cerebrovascular disease affecting unspecified side: Secondary | ICD-10-CM | POA: Insufficient documentation

## 2023-08-20 DIAGNOSIS — R2689 Other abnormalities of gait and mobility: Secondary | ICD-10-CM | POA: Insufficient documentation

## 2023-08-20 DIAGNOSIS — I77819 Aortic ectasia, unspecified site: Secondary | ICD-10-CM | POA: Insufficient documentation

## 2023-08-20 DIAGNOSIS — E782 Mixed hyperlipidemia: Secondary | ICD-10-CM | POA: Insufficient documentation

## 2023-08-20 DIAGNOSIS — E1169 Type 2 diabetes mellitus with other specified complication: Secondary | ICD-10-CM

## 2023-08-20 DIAGNOSIS — Z89421 Acquired absence of other right toe(s): Secondary | ICD-10-CM | POA: Insufficient documentation

## 2023-08-20 DIAGNOSIS — E1140 Type 2 diabetes mellitus with diabetic neuropathy, unspecified: Secondary | ICD-10-CM | POA: Insufficient documentation

## 2023-08-20 DIAGNOSIS — I700 Atherosclerosis of aorta: Secondary | ICD-10-CM | POA: Insufficient documentation

## 2023-08-20 DIAGNOSIS — M1990 Unspecified osteoarthritis, unspecified site: Secondary | ICD-10-CM | POA: Insufficient documentation

## 2023-08-20 DIAGNOSIS — I209 Angina pectoris, unspecified: Secondary | ICD-10-CM | POA: Insufficient documentation

## 2023-08-20 DIAGNOSIS — I252 Old myocardial infarction: Secondary | ICD-10-CM | POA: Insufficient documentation

## 2023-08-20 MED ORDER — CICLOPIROX 8 % EX SOLN: Freq: Every day | CUTANEOUS | 11 refills | Status: AC

## 2023-08-20 NOTE — Progress Notes (Unsigned)
 Subjective:  Patient ID: Eric Gallagher, male    DOB: 1940-04-29,  MRN: [id number]  Eric Gallagher presents to clinic today for:  Chief Complaint  Patient presents with   Nail Problem    Last A1c: 7.2. Takes coumadin . Has yellow and thick toenails bilaterally. Has not tried any otc treatments. Needs nails trimmed today. Right 1st amp about a year ago due to infection. PCP referred him here.    Patient notes nails are thick and elongated, causing pain in shoe gear when ambulating.  He has a history of a previous right hallux amputation.  He is starting to notice bending of the right second toe with thickening of the right second toenail and hard skin at the tip of the toe.  If this is fungal, he is interested in going on some type of treatment if it is affordable.  PCP is Ransom Other, MD. last seen 08/04/2023  Past Medical History:  Diagnosis Date   Anginal pain (HCC)    h/o    Arrhythmia    Chronic AFIB, no anti-coag patient desire and low platelet count   Arthritis    both hips    BPH (benign prostatic hyperplasia)    Chronic a-fib (HCC)    Coronary artery disease    NSTEMI in 2005, Taxus DES LAD   Diabetes mellitus without complication (HCC)    type II, some neuropathy   Dysrhythmia    afib   ED (erectile dysfunction)    History of kidney stones 2012   passed spontaneously   Hyperlipidemia    Hypertension    Old MI (myocardial infarction)    Stroke (HCC) 2017   Thrombocytopenia (HCC)    chronic   Allergies  Allergen Reactions   Flomax [Tamsulosin Hcl] Other (See Comments)    Dizzy    Objective:  Eric Gallagher is a pleasant 83 y.o. male in NAD. AAO x 3.  Vascular Examination: Patient has palpable DP pulse, absent PT pulse bilateral.  Delayed capillary refill bilateral toes.  Sparse digital hair bilateral.  Proximal to distal cooling WNL bilateral.    Dermatological Examination: Interspaces are clear with no open lesions noted bilateral.  Skin is shiny and  atrophic bilateral.  Nails are 3-27mm thick, with yellowish/brown discoloration, subungual debris and distal onycholysis x 9.  There is pain with compression of nails x 9.  There are hyperkeratotic lesions noted at the distal aspect of the right second toe.  Orthopedic examination: Previous right hallux amputation.  There is semiflexible contracture of the right second toe at the PIPJ  Patient qualifies for at-risk foot care because of history of right hallux amputation.  Assessment/Plan: 1. History of amputation of hallux (HCC)   2. Type 2 diabetes mellitus with other specified complication, without long-term current use of insulin  (HCC)   3. Pain due to onychomycosis of toenails of both feet   4. Hammertoe of second toe of right foot   5. Corn of toe   6. Long term current use of anticoagulant     Meds ordered this encounter  Medications   ciclopirox  (PENLAC ) 8 % solution    Sig: Apply topically at bedtime. Apply thin layer over nail. Apply daily over previous coat. Remove weekly with polish remover.    Dispense:  6.6 mL    Refill:  11   Mycotic nails x 9 were sharply debrided with sterile nail nippers and power debriding burr to decrease bulk and length.  Hyperkeratotic lesion at distal right second toe was shaved with a sterile #313 blade.  Patient informed that it is typical for an adjacent toe to start to contract and also shift in the transverse plane following amputation.  This does place him at increased risk for distal or dorsal toe corns.  The toenail will get thicker since it is receiving more trauma from the bent toe and pressure directly onto the nail with every step he takes.  Return in about 3 months (around 11/20/2023) for [facility].   Awanda CHARM Imperial, DPM, FACFAS [facility]     [address] [place], [place] [address]                Office [phone]  Fax [phone]

## 2023-09-02 DIAGNOSIS — Z7901 Long term (current) use of anticoagulants: Secondary | ICD-10-CM | POA: Diagnosis not present

## 2023-09-02 DIAGNOSIS — E539 Vitamin B deficiency, unspecified: Secondary | ICD-10-CM | POA: Diagnosis not present

## 2023-09-07 ENCOUNTER — Encounter (HOSPITAL_COMMUNITY): Payer: Self-pay

## 2023-09-07 ENCOUNTER — Other Ambulatory Visit: Payer: Self-pay

## 2023-09-07 ENCOUNTER — Emergency Department (HOSPITAL_COMMUNITY)

## 2023-09-07 ENCOUNTER — Observation Stay (HOSPITAL_COMMUNITY)
Admission: EM | Admit: 2023-09-07 | Discharge: 2023-09-11 | Disposition: A | Discharge status: Home / Self Care | Attending: Internal Medicine | Admitting: Internal Medicine

## 2023-09-07 DIAGNOSIS — D696 Thrombocytopenia, unspecified: Secondary | ICD-10-CM | POA: Diagnosis not present

## 2023-09-07 DIAGNOSIS — Z794 Long term (current) use of insulin: Secondary | ICD-10-CM | POA: Diagnosis not present

## 2023-09-07 DIAGNOSIS — E119 Type 2 diabetes mellitus without complications: Secondary | ICD-10-CM

## 2023-09-07 DIAGNOSIS — R9389 Abnormal findings on diagnostic imaging of other specified body structures: Secondary | ICD-10-CM | POA: Diagnosis not present

## 2023-09-07 DIAGNOSIS — I10 Essential (primary) hypertension: Secondary | ICD-10-CM | POA: Diagnosis not present

## 2023-09-07 DIAGNOSIS — R1310 Dysphagia, unspecified: Principal | ICD-10-CM | POA: Insufficient documentation

## 2023-09-07 DIAGNOSIS — Z8719 Personal history of other diseases of the digestive system: Secondary | ICD-10-CM

## 2023-09-07 DIAGNOSIS — I159 Secondary hypertension, unspecified: Secondary | ICD-10-CM | POA: Diagnosis not present

## 2023-09-07 DIAGNOSIS — E785 Hyperlipidemia, unspecified: Secondary | ICD-10-CM | POA: Diagnosis present

## 2023-09-07 DIAGNOSIS — Z7984 Long term (current) use of oral hypoglycemic drugs: Secondary | ICD-10-CM | POA: Diagnosis not present

## 2023-09-07 DIAGNOSIS — Z79899 Other long term (current) drug therapy: Secondary | ICD-10-CM | POA: Insufficient documentation

## 2023-09-07 DIAGNOSIS — K2950 Unspecified chronic gastritis without bleeding: Secondary | ICD-10-CM | POA: Diagnosis not present

## 2023-09-07 DIAGNOSIS — R791 Abnormal coagulation profile: Secondary | ICD-10-CM | POA: Diagnosis not present

## 2023-09-07 DIAGNOSIS — R001 Bradycardia, unspecified: Secondary | ICD-10-CM

## 2023-09-07 DIAGNOSIS — I2510 Atherosclerotic heart disease of native coronary artery without angina pectoris: Secondary | ICD-10-CM | POA: Diagnosis not present

## 2023-09-07 DIAGNOSIS — N400 Enlarged prostate without lower urinary tract symptoms: Secondary | ICD-10-CM | POA: Diagnosis present

## 2023-09-07 DIAGNOSIS — K2210 Ulcer of esophagus without bleeding: Secondary | ICD-10-CM | POA: Insufficient documentation

## 2023-09-07 DIAGNOSIS — Z8673 Personal history of transient ischemic attack (TIA), and cerebral infarction without residual deficits: Secondary | ICD-10-CM | POA: Diagnosis not present

## 2023-09-07 DIAGNOSIS — I4821 Permanent atrial fibrillation: Secondary | ICD-10-CM | POA: Diagnosis present

## 2023-09-07 DIAGNOSIS — R079 Chest pain, unspecified: Secondary | ICD-10-CM | POA: Diagnosis not present

## 2023-09-07 DIAGNOSIS — R070 Pain in throat: Secondary | ICD-10-CM | POA: Diagnosis not present

## 2023-09-07 DIAGNOSIS — R0789 Other chest pain: Secondary | ICD-10-CM | POA: Diagnosis not present

## 2023-09-07 LAB — CBC WITH DIFFERENTIAL/PLATELET
Abs Immature Granulocytes: 0.01 K/uL (ref 0.00–0.07)
Basophils Absolute: 0 K/uL (ref 0.0–0.1)
Basophils Relative: 1 %
Eosinophils Absolute: 0.1 K/uL (ref 0.0–0.5)
Eosinophils Relative: 2 %
HCT: 47.4 % (ref 39.0–52.0)
Hemoglobin: 15.6 g/dL (ref 13.0–17.0)
Immature Granulocytes: 0 %
Lymphocytes Relative: 14 %
Lymphs Abs: 0.8 K/uL (ref 0.7–4.0)
MCH: 31.3 pg (ref 26.0–34.0)
MCHC: 32.9 g/dL (ref 30.0–36.0)
MCV: 95 fL (ref 80.0–100.0)
Monocytes Absolute: 0.6 K/uL (ref 0.1–1.0)
Monocytes Relative: 10 %
Neutro Abs: 4.2 K/uL (ref 1.7–7.7)
Neutrophils Relative %: 73 %
Platelets: 119 K/uL — ABNORMAL LOW (ref 150–400)
RBC: 4.99 MIL/uL (ref 4.22–5.81)
RDW: 13.5 % (ref 11.5–15.5)
WBC: 5.7 K/uL (ref 4.0–10.5)
nRBC: 0 % (ref 0.0–0.2)

## 2023-09-07 LAB — PROTIME-INR
INR: 1.8 — ABNORMAL HIGH (ref 0.8–1.2)
Prothrombin Time: 21.8 s — ABNORMAL HIGH (ref 11.4–15.2)

## 2023-09-07 LAB — COMPREHENSIVE METABOLIC PANEL WITH GFR
ALT: 16 U/L (ref 0–44)
AST: 19 U/L (ref 15–41)
Albumin: 3.7 g/dL (ref 3.5–5.0)
Alkaline Phosphatase: 49 U/L (ref 38–126)
Anion gap: 7 (ref 5–15)
BUN: 15 mg/dL (ref 8–23)
CO2: 27 mmol/L (ref 22–32)
Calcium: 9.3 mg/dL (ref 8.9–10.3)
Chloride: 102 mmol/L (ref 98–111)
Creatinine, Ser: 0.97 mg/dL (ref 0.61–1.24)
GFR, Estimated: >60 mL/min (ref >60)
Glucose, Bld: 173 mg/dL — ABNORMAL HIGH (ref 70–99)
Potassium: 4.3 mmol/L (ref 3.5–5.1)
Sodium: 136 mmol/L (ref 135–145)
Total Bilirubin: 0.8 mg/dL (ref 0.0–1.2)
Total Protein: 6.8 g/dL (ref 6.5–8.1)

## 2023-09-07 LAB — TROPONIN I (HIGH SENSITIVITY)
Troponin I (High Sensitivity): 8 ng/L (ref <18)
Troponin I (High Sensitivity): 7 ng/L (ref <18)

## 2023-09-07 LAB — HEMOGLOBIN A1C
Hgb A1c MFr Bld: 7.5 % — ABNORMAL HIGH (ref 4.8–5.6)
Mean Plasma Glucose: 168.55 mg/dL

## 2023-09-07 LAB — LIPASE, BLOOD: Lipase: 35 U/L (ref 11–51)

## 2023-09-07 LAB — GLUCOSE, CAPILLARY
Glucose-Capillary: 158 mg/dL — ABNORMAL HIGH (ref 70–99)
Glucose-Capillary: 127 mg/dL — ABNORMAL HIGH (ref 70–99)

## 2023-09-07 MED ORDER — BENAZEPRIL HCL 20 MG PO TABS
20.0000 mg | ORAL_TABLET | Freq: Every day | ORAL | Status: DC
  Administered 2023-09-07 – 2023-09-10 (×4): 20 mg via ORAL
  Filled 2023-09-07 (×5): qty 1

## 2023-09-07 MED ORDER — PANTOPRAZOLE SODIUM 40 MG IV SOLR
40.0000 mg | Freq: Once | INTRAVENOUS | Status: AC
  Administered 2023-09-07: 40 mg via INTRAVENOUS
  Filled 2023-09-07: qty 10

## 2023-09-07 MED ORDER — METOPROLOL TARTRATE 50 MG PO TABS
50.0000 mg | ORAL_TABLET | Freq: Two times a day (BID) | ORAL | Status: DC
  Administered 2023-09-07: 50 mg via ORAL
  Filled 2023-09-07: qty 1

## 2023-09-07 MED ORDER — ALBUTEROL SULFATE (2.5 MG/3ML) 0.083% IN NEBU: 2.5000 mg | INHALATION_SOLUTION | Freq: Four times a day (QID) | RESPIRATORY_TRACT | Status: DC | PRN

## 2023-09-07 MED ORDER — PANTOPRAZOLE SODIUM 40 MG IV SOLR: 40.0000 mg | Freq: Two times a day (BID) | INTRAVENOUS | Status: DC

## 2023-09-07 MED ORDER — GABAPENTIN 100 MG PO CAPS: 100.0000 mg | ORAL_CAPSULE | Freq: Two times a day (BID) | ORAL | Status: DC

## 2023-09-07 MED ORDER — FINASTERIDE 5 MG PO TABS
5.0000 mg | ORAL_TABLET | Freq: Every day | ORAL | Status: DC
  Administered 2023-09-07 – 2023-09-10 (×4): 5 mg via ORAL
  Filled 2023-09-07 (×4): qty 1

## 2023-09-07 MED ORDER — ACETAMINOPHEN 650 MG RE SUPP: 650.0000 mg | Freq: Four times a day (QID) | RECTAL | Status: DC | PRN

## 2023-09-07 MED ORDER — HEPARIN (PORCINE) 25000 UT/250ML-% IV SOLN
1300.0000 [IU]/h | INTRAVENOUS | Status: AC
  Administered 2023-09-07 – 2023-09-08 (×2): 1300 [IU]/h via INTRAVENOUS
  Filled 2023-09-07 (×2): qty 250

## 2023-09-07 MED ORDER — SIMVASTATIN 20 MG PO TABS
40.0000 mg | ORAL_TABLET | Freq: Every evening | ORAL | Status: DC
Start: 2023-09-07 — End: 2023-09-11
  Administered 2023-09-07 – 2023-09-10 (×4): 40 mg via ORAL
  Filled 2023-09-07 (×4): qty 2

## 2023-09-07 MED ORDER — ACETAMINOPHEN 325 MG PO TABS: 650.0000 mg | ORAL_TABLET | Freq: Four times a day (QID) | ORAL | Status: DC | PRN

## 2023-09-07 MED ORDER — PANTOPRAZOLE SODIUM 40 MG IV SOLR
40.0000 mg | Freq: Two times a day (BID) | INTRAVENOUS | Status: DC
  Administered 2023-09-07 – 2023-09-08 (×3): 40 mg via INTRAVENOUS
  Filled 2023-09-07 (×3): qty 10

## 2023-09-07 MED ORDER — INSULIN ASPART 100 UNIT/ML IJ SOLN
0.0000 [IU] | Freq: Three times a day (TID) | INTRAMUSCULAR | Status: DC
  Administered 2023-09-07: 2 [IU] via SUBCUTANEOUS
  Administered 2023-09-08: 1 [IU] via SUBCUTANEOUS
  Administered 2023-09-08: 2 [IU] via SUBCUTANEOUS
  Administered 2023-09-08: 1 [IU] via SUBCUTANEOUS
  Administered 2023-09-09: 2 [IU] via SUBCUTANEOUS
  Administered 2023-09-09 – 2023-09-10 (×2): 1 [IU] via SUBCUTANEOUS
  Administered 2023-09-10 – 2023-09-11 (×3): 2 [IU] via SUBCUTANEOUS

## 2023-09-07 MED ORDER — METOPROLOL TARTRATE 50 MG PO TABS: 50.0000 mg | ORAL_TABLET | Freq: Every day | ORAL | Status: DC

## 2023-09-07 MED ORDER — GLUCAGON HCL RDNA (DIAGNOSTIC) 1 MG IJ SOLR
1.0000 mg | Freq: Once | INTRAMUSCULAR | Status: AC | PRN
  Administered 2023-09-07: 1 mg via INTRAVENOUS
  Filled 2023-09-07: qty 1

## 2023-09-07 MED ORDER — CICLOPIROX 8 % EX SOLN: Freq: Every day | CUTANEOUS | Status: DC

## 2023-09-07 MED ORDER — INSULIN ASPART 100 UNIT/ML IJ SOLN: 0.0000 [IU] | Freq: Every day | INTRAMUSCULAR | Status: DC

## 2023-09-07 NOTE — Progress Notes (Signed)
 PHARMACY - ANTICOAGULATION CONSULT NOTE  Pharmacy Consult for IV heparin  Indication: atrial fibrillation  Allergies  Allergen Reactions   Flomax [Tamsulosin Hcl] Other (See Comments)    Dizzy    Patient Measurements: Height: 6' 2 (188 cm) Weight: 88.5 kg (195 lb) IBW/kg (Calculated) : 82.2 HEPARIN  DW (KG): 88.5  Vital Signs: Temp: 97.5 F (36.4 C) (07/29 1500) Temp Source: Oral (07/29 1500) BP: 128/92 (07/29 1500) Pulse Rate: 62 (07/29 1500)  Labs: Recent Labs    09/07/23 0839 09/07/23 1023  HGB 15.6  --   HCT 47.4  --   PLT 119*  --   LABPROT 21.8*  --   INR 1.8*  --   CREATININE 0.97  --   TROPONINIHS 8 7    Estimated Creatinine Clearance: 65.9 mL/min (by C-G formula based on SCr of 0.97 mg/dL).   Medical History: Past Medical History:  Diagnosis Date   Anginal pain (HCC)    h/o    Arrhythmia    Chronic AFIB, no anti-coag patient desire and low platelet count   Arthritis    both hips    BPH (benign prostatic hyperplasia)    Chronic a-fib (HCC)    Coronary artery disease    NSTEMI in 2005, Taxus DES LAD   Diabetes mellitus without complication (HCC)    type II, some neuropathy   Dysrhythmia    afib   ED (erectile dysfunction)    History of kidney stones 2012   passed spontaneously   Hyperlipidemia    Hypertension    Old MI (myocardial infarction)    Stroke (HCC) 2017   Thrombocytopenia (HCC)    chronic    Medications:  Medications Prior to Admission  Medication Sig Dispense Refill Last Dose/Taking   acetaminophen  (TYLENOL ) 650 MG CR tablet Take 1,300 mg by mouth daily.   09/07/2023   benazepril  (LOTENSIN ) 20 MG tablet Take 20 mg by mouth at bedtime.   09/06/2023   ciclopirox  (PENLAC ) 8 % solution Apply topically at bedtime. Apply thin layer over nail. Apply daily over previous coat. Remove weekly with polish remover. 6.6 mL 11 Past Week   finasteride  (PROSCAR ) 5 MG tablet Take 5 mg by mouth at bedtime.   09/06/2023   metFORMIN  (GLUCOPHAGE )  1000 MG tablet Take 1,000 mg by mouth at bedtime.   09/06/2023   metoprolol  tartrate (LOPRESSOR ) 50 MG tablet Take 1 tablet (50 mg total) by mouth 2 (two) times daily. (Patient taking differently: Take 50 mg by mouth at bedtime.) 60 tablet 2 09/06/2023   simvastatin  (ZOCOR ) 40 MG tablet Take 40 mg by mouth every evening.   09/06/2023   warfarin (COUMADIN ) 5 MG tablet Take 1 tablet (5 mg total) by mouth See admin instructions. Take 5 mg by mouth daily except for Saturdays ---Restart Coumadin  on Wednesday 06/15/2018 (Patient taking differently: Take 5 mg by mouth See admin instructions. Take 5 mg by mouth once daily on Sunday, Monday, Tuesday, Wednesday, Thursday, and Friday.) 30 tablet 1 09/06/2023 at  9:00 PM   gabapentin  (NEURONTIN ) 100 MG capsule Take by mouth. (Patient not taking: Reported on 09/07/2023)   Not Taking   nitroGLYCERIN  (NITROSTAT ) 0.4 MG SL tablet Place 1 tablet (0.4 mg total) under the tongue every 5 (five) minutes as needed for chest pain. (Patient not taking: Reported on 09/07/2023) 25 tablet 6 Not Taking   Scheduled:   benazepril   20 mg Oral QHS   finasteride   5 mg Oral QHS   insulin  aspart  0-5 Units  Subcutaneous QHS   insulin  aspart  0-9 Units Subcutaneous TID WC   [START ON 09/08/2023] metoprolol  tartrate  50 mg Oral QHS   pantoprazole  (PROTONIX ) IV  40 mg Intravenous Q12H   simvastatin   40 mg Oral QPM   Infusions:   Assessment: Patient is an 83 yo male who presented to the ED with difficulty swallowing. Patient is scheduled for EGD tomorrow morning on 7/30. Holding warfarin during admission prior to procedure. Pharmacy has been consulted for IV heparin  management.  Of note, patient is on warfarin PTA. Last dose of warfarin: 7/28 evening 7/29 INR: 1.8  Goal of Therapy:  INR 2-3 Heparin  level 0.3-0.7 units/ml Monitor platelets by anticoagulation protocol: Yes   Plan:  Initiate heparin  at 1300 units/hr Recheck heparin  level after 8 hours to confirm therapeutic Monitor  daily heparin  levels and CBC Monitor for any signs/symptoms of bleeding  Thank you for allowing pharmacy to be involved with this patient's care.  Mendel Barter, PharmD PGY1 Clinical Pharmacist [facility]  09/07/2023 5:22 PM

## 2023-09-07 NOTE — ED Provider Notes (Signed)
 [place] EMERGENCY DEPARTMENT AT [facility] Provider Note   CSN: [id number] Arrival date & time: 09/07/23  9296     Patient presents with: Dysphagia   Eric Gallagher is a 83 y.o. male.   Patient here with difficulty eating and drinking.  He has a history of bad esophagitis.  He is not taking medications for it anymore.  History of A-fib stroke.  He states that about a week ago he felt like food got stuck while eating [place] food with shrimp which is similar to what happened about a year ago.  He has really had difficulty eating and drinking since then.  Felt like things were passing but with a lot of discomfort and now here in the last 24 hours he feels like it is getting harder and harder cannot really keep down liquids.  He had a lot of discomfort in the middle of his chest.  But mostly seems to be associated with eating now is at rest.  He takes Coumadin .  He denies any weakness numbness tingling.  No headache.  No falls.  The history is provided by the patient.       Prior to Admission medications   Medication Sig Start Date End Date Taking? Authorizing Provider  ACCU-CHEK AVIVA PLUS test strip Use as directed weekly. 03/07/16   [provider]  ACCU-CHEK SOFTCLIX LANCETS lancets Use as directed weekly. 03/11/16   [provider]  benazepril  (LOTENSIN ) 20 MG tablet Take 20 mg by mouth at bedtime. 04/19/18   [provider]  ciclopirox  (PENLAC ) 8 % solution Apply topically at bedtime. Apply thin layer over nail. Apply daily over previous coat. Remove weekly with polish remover. 08/20/23   McCaughan, Dia D, DPM  finasteride  (PROSCAR ) 5 MG tablet Take 5 mg by mouth at bedtime. 09/18/21   [provider]  gabapentin  (NEURONTIN ) 300 MG capsule Take 300 mg by mouth 2 (two) times daily. 12/04/22   [provider]  glipiZIDE  (GLUCOTROL ) 5 MG tablet Take 2.5 mg by mouth at bedtime. Patient taking differently: Take 5 mg by mouth at  bedtime.    [provider]  metFORMIN  (GLUCOPHAGE ) 1000 MG tablet Take 1,000 mg by mouth 2 (two) times daily with a meal.    [provider]  metoprolol  tartrate (LOPRESSOR ) 50 MG tablet Take 1 tablet (50 mg total) by mouth 2 (two) times daily. 06/12/18   Pearlean Manus, MD  nitroGLYCERIN  (NITROSTAT ) 0.4 MG SL tablet Place 1 tablet (0.4 mg total) under the tongue every 5 (five) minutes as needed for chest pain. 09/26/21   Swinyer, Rosaline HERO, NP  simvastatin  (ZOCOR ) 40 MG tablet Take 40 mg by mouth every evening.    [provider]  warfarin (COUMADIN ) 5 MG tablet Take 1 tablet (5 mg total) by mouth See admin instructions. Take 5 mg by mouth daily except for Saturdays ---Restart Coumadin  on Wednesday 06/15/2018 06/12/18   Pearlean Manus, MD    Allergies: Flomax [tamsulosin hcl]    Review of Systems  Updated Vital Signs BP (!) 156/95   Pulse 68   Temp 97.8 F (36.6 C)   Resp (!) 25   Ht 6' 2 (1.88 m)   Wt 88.5 kg   SpO2 99%   BMI 25.04 kg/m   Physical Exam Vitals and nursing note reviewed.  Constitutional:      General: He is not in acute distress.    Appearance: He is well-developed. He is not ill-appearing.  HENT:  Head: Normocephalic and atraumatic.     Nose: Nose normal.     Mouth/Throat:     Mouth: Mucous membranes are moist.  Eyes:     Extraocular Movements: Extraocular movements intact.     Conjunctiva/sclera: Conjunctivae normal.     Pupils: Pupils are equal, round, and reactive to light.  Cardiovascular:     Rate and Rhythm: Normal rate and regular rhythm.     Pulses: Normal pulses.     Heart sounds: Normal heart sounds. No murmur heard. Pulmonary:     Effort: Pulmonary effort is normal. No respiratory distress.     Breath sounds: Normal breath sounds.  Abdominal:     General: Abdomen is flat.     Palpations: Abdomen is soft.     Tenderness: There is no abdominal tenderness.  Musculoskeletal:        General: No swelling.      Cervical back: Normal range of motion and neck supple.  Skin:    General: Skin is warm and dry.     Capillary Refill: Capillary refill takes less than 2 seconds.  Neurological:     General: No focal deficit present.     Mental Status: He is alert and oriented to person, place, and time.     Cranial Nerves: No cranial nerve deficit.     Sensory: No sensory deficit.     Motor: No weakness.     Coordination: Coordination normal.  Psychiatric:        Mood and Affect: Mood normal.     (all labs ordered are listed, but only abnormal results are displayed) Labs Reviewed  CBC WITH DIFFERENTIAL/PLATELET - Abnormal; Notable for the following components:      Result Value   Platelets 119 (*)    All other components within normal limits  COMPREHENSIVE METABOLIC PANEL WITH GFR - Abnormal; Notable for the following components:   Glucose, Bld 173 (*)    All other components within normal limits  PROTIME-INR - Abnormal; Notable for the following components:   Prothrombin Time 21.8 (*)    INR 1.8 (*)    All other components within normal limits  LIPASE, BLOOD  TROPONIN I (HIGH SENSITIVITY)    EKG: EKG Interpretation Date/Time:  Tuesday September 07 2023 08:33:06 EDT Ventricular Rate:  75 PR Interval:    QRS Duration:  83 QT Interval:  401 QTC Calculation: 448 R Axis:   18  Text Interpretation: Atrial fibrillation Abnormal R-wave progression, early transition Minimal ST depression, diffuse leads Confirmed by Ruthe Cornet [phone]) on 09/07/2023 8:55:11 AM  Radiology: [facility] Chest Portable 1 View Result Date: 09/07/2023 CLINICAL DATA:  Pain. EXAM: PORTABLE CHEST 1 VIEW COMPARISON:  12/18/2022. FINDINGS: Bilateral lung fields are clear. Bilateral costophrenic angles are clear. Note is made of elevated left hemidiaphragm. Normal cardio-mediastinal silhouette. No acute osseous abnormalities. The soft tissues are within normal limits. IMPRESSION: No active disease. Electronically Signed   By: Ree Molt M.D.   On: 09/07/2023 08:54     Procedures   Medications Ordered in the ED  pantoprazole  (PROTONIX ) injection 40 mg (40 mg Intravenous Given 09/07/23 0844)                                    Medical Decision Making Amount and/or Complexity of Data Reviewed Labs: ordered. Radiology: ordered.  Risk Prescription drug management. Decision regarding hospitalization.   DUWAN ADRIAN is here  with chest discomfort severe pain when he is eating and drinking and cannot keep down solids and fluids at this point.  He felt like about a week ago he was eating some [place] food and felt like something is stuck in his chest.  He has been having some difficulty eating and drinking since then but gotten worse here over the last 24 to 48 hours.  He is having to really cut up his food to try to keep things down but he feels like it is things get stuck and very painful started to regurgitate most of his food and liquid.  He denies any weakness numbness tingling.  She said he has had this happen before and had an EGD about a year ago.  He said he had a severe inflammation in his esophagus.  Differential diagnosis likely the same could be a subacute food impaction/severe esophagitis, seems less likely to be cardiac process or infectious process.  He looks uncomfortable on exam.  I will give him IV Protonix  check troponin EKG chest x-ray and basic labs.  I will reach out to [facility] who did the scope last year.  Does not sound like he ever followed up with them.  He was post get a repeat scope 3 months after.  Lab work for my review and interpretation is unremarkable.  No significant leukocytosis anemia or electrolyte abnormality.  Troponin normal.  Chest x-ray without any acute findings per my review interpretation.  No pneumonia no pneumothorax.  I talked with Dr. Kriss with [facility].  They will consult.  I do think he will get an EGD.  Recommend holding Coumadin .  This chart was dictated using voice  recognition software.  Despite best efforts to proofread,  errors can occur which can change the documentation meaning.      Final diagnoses:  Dysphagia, unspecified type    ED Discharge Orders     None          Ruthe Cornet, DO 09/07/23 0930

## 2023-09-07 NOTE — Consult Note (Signed)
 [facility] Consult  Referring Provider: No ref. provider found Primary Care Physician:  Ransom Other, MD Primary Gastroenterologist: Gerhardt PCP  Reason for Consultation: Chronic dysphagia, odynophagia  SUBJECTIVE:   HPI: Eric Gallagher is a 83 y.o. male with past medical history significant for chronic atrial fibrillation (on coumadin ), hypertension, diabetes mellitus, coronary artery disease.  Presents with chief complaint of 1.5 weeks of dysphagia and odynophagia.  Denied chest pain or shortness of breath.  No nausea or vomiting.  Had episode of melena in the past related to Pepto-Bismol use.  No current blood per rectum.  No current abdominal pain. He noted that he was on medication therapy for roughly 3 months after procedure then stopped. No current PPI therapy.   Had similar presentation requiring EGD on 07/07/2022 with findings of LA grade D esophagitis, inflammation of the entire stomach, normal duodenum.  Biopsy showed moderate chronic active gastritis, H. pylori negative, erosive esophagitis, HSV and CMV negative, negative for fungi.   Past Medical History:  Diagnosis Date   Anginal pain (HCC)    h/o    Arrhythmia    Chronic AFIB, no anti-coag patient desire and low platelet count   Arthritis    both hips    BPH (benign prostatic hyperplasia)    Chronic a-fib (HCC)    Coronary artery disease    NSTEMI in 2005, Taxus DES LAD   Diabetes mellitus without complication (HCC)    type II, some neuropathy   Dysrhythmia    afib   ED (erectile dysfunction)    History of kidney stones 2012   passed spontaneously   Hyperlipidemia    Hypertension    Old MI (myocardial infarction)    Stroke (HCC) 2017   Thrombocytopenia (HCC)    chronic   Past Surgical History:  Procedure Laterality Date   APPENDECTOMY     as a teen   BIOPSY  07/07/2022   Procedure: BIOPSY;  Surgeon: Elicia Claw, MD;  Location: [facility] ENDOSCOPY;  Service: Gastroenterology;;   CARDIAC  CATHETERIZATION N/A 07/24/2014   Procedure: Right/Left Heart Cath and Coronary Angiography;  Surgeon: Victory LELON Sharps, MD;  Location: [facility] INVASIVE CV LAB;  Service: Cardiovascular;  Laterality: N/A;   ESOPHAGOGASTRODUODENOSCOPY (EGD) WITH PROPOFOL  N/A 07/07/2022   Procedure: ESOPHAGOGASTRODUODENOSCOPY (EGD) WITH PROPOFOL ;  Surgeon: Elicia Claw, MD;  Location: [facility] ENDOSCOPY;  Service: Gastroenterology;  Laterality: N/A;   EYE SURGERY Left 2016   retinal surgery, cataracts removed- both eyes   HERNIA REPAIR  1998   L inguinal    I & D EXTREMITY Right 06/18/2014   Procedure: OPEN TREATMENT RIGHT THUMB, INCOMPLETE AMPUTATION.  Washing and dressing applied to left thumb.;  Surgeon: Alm Hummer, MD;  Location: [facility] OR;  Service: Orthopedics;  Laterality: Right;   TOTAL HIP ARTHROPLASTY Right 01/04/2018   Procedure: RIGHT TOTAL HIP ARTHROPLASTY ANTERIOR APPROACH;  Surgeon: Vernetta Lonni GRADE, MD;  Location: [facility] OR;  Service: Orthopedics;  Laterality: Right;   TOTAL HIP ARTHROPLASTY Left 03/01/2018   Procedure: LEFT TOTAL HIP ARTHROPLASTY ANTERIOR APPROACH;  Surgeon: Vernetta Lonni GRADE, MD;  Location: [facility] OR;  Service: Orthopedics;  Laterality: Left;   Prior to Admission medications   Medication Sig Start Date End Date Taking? Authorizing Provider  ACCU-CHEK AVIVA PLUS test strip Use as directed weekly. 03/07/16   [provider]  ACCU-CHEK SOFTCLIX LANCETS lancets Use as directed weekly. 03/11/16   [provider]  benazepril  (LOTENSIN ) 20 MG tablet Take 20 mg by mouth at bedtime. 04/19/18  [provider]  ciclopirox  (PENLAC ) 8 % solution Apply topically at bedtime. Apply thin layer over nail. Apply daily over previous coat. Remove weekly with polish remover. 08/20/23   McCaughan, Dia D, DPM  finasteride  (PROSCAR ) 5 MG tablet Take 5 mg by mouth at bedtime. 09/18/21   [provider]  gabapentin  (NEURONTIN ) 300 MG capsule Take 300 mg by mouth 2 (two) times daily.  12/04/22   [provider]  glipiZIDE  (GLUCOTROL ) 5 MG tablet Take 2.5 mg by mouth at bedtime. Patient taking differently: Take 5 mg by mouth at bedtime.    [provider]  metFORMIN  (GLUCOPHAGE ) 1000 MG tablet Take 1,000 mg by mouth 2 (two) times daily with a meal.    [provider]  metoprolol  tartrate (LOPRESSOR ) 50 MG tablet Take 1 tablet (50 mg total) by mouth 2 (two) times daily. 06/12/18   Pearlean Manus, MD  nitroGLYCERIN  (NITROSTAT ) 0.4 MG SL tablet Place 1 tablet (0.4 mg total) under the tongue every 5 (five) minutes as needed for chest pain. 09/26/21   Swinyer, Rosaline HERO, NP  simvastatin  (ZOCOR ) 40 MG tablet Take 40 mg by mouth every evening.    [provider]  warfarin (COUMADIN ) 5 MG tablet Take 1 tablet (5 mg total) by mouth See admin instructions. Take 5 mg by mouth daily except for Saturdays ---Restart Coumadin  on Wednesday 06/15/2018 06/12/18   Pearlean Manus, MD   Current Facility-Administered Medications  Medication Dose Route Frequency Provider Last Rate Last Admin   acetaminophen  (TYLENOL ) tablet 650 mg  650 mg Oral Q6H PRN Smith, Rondell A, MD       Or   acetaminophen  (TYLENOL ) suppository 650 mg  650 mg Rectal Q6H PRN Claudene Reeves A, MD       albuterol  (PROVENTIL ) (2.5 MG/3ML) 0.083% nebulizer solution 2.5 mg  2.5 mg Nebulization Q6H PRN Smith, Rondell A, MD       pantoprazole  (PROTONIX ) injection 40 mg  40 mg Intravenous Q12H Claudene Reeves A, MD       Current Outpatient Medications  Medication Sig Dispense Refill   ACCU-CHEK AVIVA PLUS test strip Use as directed weekly.     ACCU-CHEK SOFTCLIX LANCETS lancets Use as directed weekly.     benazepril  (LOTENSIN ) 20 MG tablet Take 20 mg by mouth at bedtime.     ciclopirox  (PENLAC ) 8 % solution Apply topically at bedtime. Apply thin layer over nail. Apply daily over previous coat. Remove weekly with polish remover. 6.6 mL 11   finasteride  (PROSCAR ) 5 MG tablet Take 5 mg by mouth at  bedtime.     gabapentin  (NEURONTIN ) 300 MG capsule Take 300 mg by mouth 2 (two) times daily.     glipiZIDE  (GLUCOTROL ) 5 MG tablet Take 2.5 mg by mouth at bedtime. (Patient taking differently: Take 5 mg by mouth at bedtime.)     metFORMIN  (GLUCOPHAGE ) 1000 MG tablet Take 1,000 mg by mouth 2 (two) times daily with a meal.     metoprolol  tartrate (LOPRESSOR ) 50 MG tablet Take 1 tablet (50 mg total) by mouth 2 (two) times daily. 60 tablet 2   nitroGLYCERIN  (NITROSTAT ) 0.4 MG SL tablet Place 1 tablet (0.4 mg total) under the tongue every 5 (five) minutes as needed for chest pain. 25 tablet 6   simvastatin  (ZOCOR ) 40 MG tablet Take 40 mg by mouth every evening.     warfarin (COUMADIN ) 5 MG tablet Take 1 tablet (5 mg total) by mouth See admin instructions. Take 5 mg by mouth  daily except for Saturdays ---Restart Coumadin  on Wednesday 06/15/2018 30 tablet 1   Allergies as of 09/07/2023 - Review Complete 09/07/2023  Allergen Reaction Noted   Flomax [tamsulosin hcl] Other (See Comments) 02/07/2014   Family History  Problem Relation Age of Onset   CVA Mother    Aneurysm Father    Heart disease Brother    Heart disease Brother    Heart attack Maternal Aunt    Heart disease Maternal Aunt    Social History   Socioeconomic History   Marital status: Married    Spouse name: Not on file   Number of children: Not on file   Years of education: Not on file   Highest education level: Not on file  Occupational History   Not on file  Tobacco Use   Smoking status: Former    Current packs/day: 0.00    Types: Cigarettes    Quit date: 12/29/1965    Years since quitting: 57.7   Smokeless tobacco: Never  Vaping Use   Vaping status: Never Used  Substance and Sexual Activity   Alcohol use: No    Alcohol/week: 0.0 standard drinks of alcohol   Drug use: No   Sexual activity: Not on file  Other Topics Concern   Not on file  Social History Narrative   Not on file   Social Drivers of Health    Financial Resource Strain: Not on file  Food Insecurity: Not on file  Transportation Needs: Not on file  Physical Activity: Not on file  Stress: Not on file  Social Connections: Not on file  Intimate Partner Violence: Not on file   Review of Systems:  Review of Systems  Respiratory:  Negative for shortness of breath.   Cardiovascular:  Negative for chest pain.  Gastrointestinal:  Negative for abdominal pain, blood in stool, nausea and vomiting.       Dysphagia, odynophagia.     OBJECTIVE:   Temp:  [97.8 F (36.6 C)] 97.8 F (36.6 C) (07/29 0710) Pulse Rate:  [40-75] 75 (07/29 0945) Resp:  [17-29] 29 (07/29 0945) BP: (156-176)/(94-96) 162/94 (07/29 0930) SpO2:  [96 %-100 %] 96 % (07/29 0945) Weight:  [88.5 kg] 88.5 kg (07/29 0713)   Physical Exam Cardiovascular:     Rate and Rhythm: Normal rate. Rhythm irregular.  Pulmonary:     Effort: No respiratory distress.     Breath sounds: Normal breath sounds.  Abdominal:     General: Bowel sounds are normal. There is no distension.     Palpations: Abdomen is soft.     Tenderness: There is no abdominal tenderness. There is no guarding.     Labs: Recent Labs    09/07/23 0839  WBC 5.7  HGB 15.6  HCT 47.4  PLT 119*   BMET Recent Labs    09/07/23 0839  NA 136  K 4.3  CL 102  CO2 27  GLUCOSE 173*  BUN 15  CREATININE 0.97  CALCIUM 9.3   LFT Recent Labs    09/07/23 0839  PROT 6.8  ALBUMIN  3.7  AST 19  ALT 16  ALKPHOS 49  BILITOT 0.8   PT/INR Recent Labs    09/07/23 0839  LABPROT 21.8*  INR 1.8*    Diagnostic imaging: DG Chest Portable 1 View Result Date: 09/07/2023 CLINICAL DATA:  Pain. EXAM: PORTABLE CHEST 1 VIEW COMPARISON:  12/18/2022. FINDINGS: Bilateral lung fields are clear. Bilateral costophrenic angles are clear. Note is made of elevated left hemidiaphragm. Normal cardio-mediastinal silhouette.  No acute osseous abnormalities. The soft tissues are within normal limits. IMPRESSION: No active  disease. Electronically Signed   By: Ree Molt M.D.   On: 09/07/2023 08:54   IMPRESSION: Dysphagia  Odynophagia  History of LA grade D esophagitis History diffuse gastritis   PLAN: -Recommend EGD to further evaluate symptoms, discussed benefits, terms and risks of bleeding/infection/perforation/missed lesion/anesthesia, patient verbalized understanding and elected to proceed -IV PPI Q12Hr -Son at bedside also in agreement  -INR 1.8, hold coumadin  -Ok for clear liquid diet today, NPO at midnight for procedure tomorrow    LOS: 0 days   Estefana Keas, [facility]

## 2023-09-07 NOTE — ED Triage Notes (Signed)
 Hx of esophagus infection and states pain feels the same. C/O epigastric pain/burning/ unable to swallow a week ago. Pt about to talk in triage. Denies difficulty breathing. Axox4.

## 2023-09-07 NOTE — Plan of Care (Signed)

## 2023-09-07 NOTE — H&P (Addendum)
 History and Physical    Patient: Eric Gallagher FMW:[id number] DOB: [birth year] DOA: 09/07/2023 DOS: the patient was seen and examined on 09/07/2023 PCP: Ransom Other, MD  Patient coming from: Home  Chief Complaint:  Chief Complaint  Patient presents with   Dysphagia   HPI: Eric Gallagher is a 83 y.o. male with medical history significant of chronic atrial fibrillation on Eliquis, hypertension, coronary artery disease, history of stroke, diabetes mellitus type 2, presents with difficulty swallowing and hoarseness.  He has been experiencing difficulty swallowing for about a week and a half. Food often gets stuck in his throat, necessitating fine chewing and drinking water to aid swallowing. Symptoms have progressed to the point even water sometimes gets stuck, requiring him to sit back and wait for it to go down.  Denies having any nausea, vomiting, chest pain, or shortness of breath, related to symptoms.  He does not appear to be on any scheduled antiacid medicines.   He has also experienced hoarseness in his voice. He denies any history of smoking in the last fifty to sixty years, although he smoked two packs a day for about eight years, approximately seventy years ago.  No fevers or chills are present. Bowel movements are irregular, sometimes easy and sometimes difficult, and he uses a stool softener. His last bowel movement was yesterday.  Last dose of Coumadin  was yesterday evening.  Patient reports having similar symptoms previously last year in May.  Review of records note patient's last EGD was performed 07/07/2022 which noted LA grade D esophagitis with inflammation of the entire stomach and normal duodenum.  Biopsies noted moderate chronic gastritis was H. pylori negative and erosive esophagitis that was negative for CMV and HSV.  On admission to the emergency department patient was noted to have heart rate 40-73, respirations 17-25, blood pressure was elevated at 176/96, O2 saturation  maintained.  Labs platelets 119, glucose 173, and INR 1.8.  Chest x-ray showed no acute abnormality.  [facility] have been consulted.  Patient had been given Protonix  40 mg IV x 1 dose.  Review of Systems: As mentioned in the history of present illness. All other systems reviewed and are negative. Past Medical History:  Diagnosis Date   Anginal pain (HCC)    h/o    Arrhythmia    Chronic AFIB, no anti-coag patient desire and low platelet count   Arthritis    both hips    BPH (benign prostatic hyperplasia)    Chronic a-fib (HCC)    Coronary artery disease    NSTEMI in 2005, Taxus DES LAD   Diabetes mellitus without complication (HCC)    type II, some neuropathy   Dysrhythmia    afib   ED (erectile dysfunction)    History of kidney stones 2012   passed spontaneously   Hyperlipidemia    Hypertension    Old MI (myocardial infarction)    Stroke (HCC) 2017   Thrombocytopenia (HCC)    chronic   Past Surgical History:  Procedure Laterality Date   APPENDECTOMY     as a teen   BIOPSY  07/07/2022   Procedure: BIOPSY;  Surgeon: Elicia Claw, MD;  Location: [facility];  Service: Gastroenterology;;   CARDIAC CATHETERIZATION N/A 07/24/2014   Procedure: Right/Left Heart Cath and Coronary Angiography;  Surgeon: Victory LELON Sharps, MD;  Location[facility] INVASIVE CV LAB;  Service: Cardiovascular;  Laterality: N/A;   ESOPHAGOGASTRODUODENOSCOPY (EGD) WITH PROPOFOL  N/A 07/07/2022   Procedure: ESOPHAGOGASTRODUODENOSCOPY (EGD) WITH PROPOFOL ;  Surgeon: Elicia,  Layla, MD;  Location: [facility]OS[facility];  Service: Gastroenterology;  Laterality: N/A;   EYE SURGERY Left 2016   retinal surgery, cataracts removed- both eyes   HERNIA REPAIR  1998   L inguinal    I & D EXTREMITY Right 06/18/2014   Procedure: OPEN TREATMENT RIGHT THUMB, INCOMPLETE AMPUTATION.  Washing and dressing applied to left thumb.;  Surgeon: Alm Hummer, MD;  Location: [facility] OR;  Service: Orthopedics;  Laterality: Right;   TOTAL HIP ARTHROPLASTY  Right 01/04/2018   Procedure: RIGHT TOTAL HIP ARTHROPLASTY ANTERIOR APPROACH;  Surgeon: Vernetta Lonni GRADE, MD;  Location: [facility] OR;  Service: Orthopedics;  Laterality: Right;   TOTAL HIP ARTHROPLASTY Left 03/01/2018   Procedure: LEFT TOTAL HIP ARTHROPLASTY ANTERIOR APPROACH;  Surgeon: Vernetta Lonni GRADE, MD;  Location: [facility] OR;  Service: Orthopedics;  Laterality: Left;   Social History:  reports that he quit smoking about 57 years ago. His smoking use included cigarettes. He has never used smokeless tobacco. He reports that he does not drink alcohol and does not use drugs.  Allergies  Allergen Reactions   Flomax [Tamsulosin Hcl] Other (See Comments)    Dizzy    Family History  Problem Relation Age of Onset   CVA Mother    Aneurysm Father    Heart disease Brother    Heart disease Brother    Heart attack Maternal Aunt    Heart disease Maternal Aunt     Prior to Admission medications   Medication Sig Start Date End Date Taking? Authorizing Provider  ACCU-CHEK AVIVA PLUS test strip Use as directed weekly. 03/07/16   [provider]  ACCU-CHEK SOFTCLIX LANCETS lancets Use as directed weekly. 03/11/16   [provider]  benazepril  (LOTENSIN ) 20 MG tablet Take 20 mg by mouth at bedtime. 04/19/18   [provider]  ciclopirox  (PENLAC ) 8 % solution Apply topically at bedtime. Apply thin layer over nail. Apply daily over previous coat. Remove weekly with polish remover. 08/20/23   McCaughan, Dia D, DPM  finasteride  (PROSCAR ) 5 MG tablet Take 5 mg by mouth at bedtime. 09/18/21   [provider]  gabapentin  (NEURONTIN ) 300 MG capsule Take 300 mg by mouth 2 (two) times daily. 12/04/22   [provider]  glipiZIDE  (GLUCOTROL ) 5 MG tablet Take 2.5 mg by mouth at bedtime. Patient taking differently: Take 5 mg by mouth at bedtime.    [provider]  metFORMIN  (GLUCOPHAGE ) 1000 MG tablet Take 1,000 mg by mouth 2 (two) times daily with a meal.     [provider]  metoprolol  tartrate (LOPRESSOR ) 50 MG tablet Take 1 tablet (50 mg total) by mouth 2 (two) times daily. 06/12/18   Pearlean Manus, MD  nitroGLYCERIN  (NITROSTAT ) 0.4 MG SL tablet Place 1 tablet (0.4 mg total) under the tongue every 5 (five) minutes as needed for chest pain. 09/26/21   Swinyer, Rosaline HERO, NP  simvastatin  (ZOCOR ) 40 MG tablet Take 40 mg by mouth every evening.    [provider]  warfarin (COUMADIN ) 5 MG tablet Take 1 tablet (5 mg total) by mouth See admin instructions. Take 5 mg by mouth daily except for Saturdays ---Restart Coumadin  on Wednesday 06/15/2018 06/12/18   Pearlean Manus, MD    Physical Exam: Vitals:   09/07/23 0710 09/07/23 0713 09/07/23 0835 09/07/23 0930  BP: (!) 176/96  (!) 156/95 (!) 162/94  Pulse: 73  68 (!) 40  Resp: 17  (!) 25 (!) 23  Temp: 97.8 F (36.6 C)  SpO2: 97%  99% 100%  Weight:  88.5 kg    Height:  6' 2 (1.88 m)       Constitutional: Elderly male currently in no acute distress. Eyes: PERRL, lids and conjunctivae normal ENMT: Mucous membranes are dry.  Hard of hearing.  Neck: normal, supple, no JVD. Respiratory: clear to auscultation bilaterally, no wheezing, no crackles. Normal respiratory effort. No accessory muscle use.  Cardiovascular: Irregular irregular.  No lower extremity edema. Abdomen: no tenderness, no masses palpated.   Bowel sounds positive.  Musculoskeletal: no clubbing / cyanosis. No joint deformity upper and lower extremities. Good ROM, no contractures. Normal muscle tone.  Skin: no rashes, lesions, ulcers. No induration Neurologic: CN 2-12 grossly intact.  Strength 5/5 in all 4.  Psychiatric: Normal judgment and insight. Alert and oriented x 3. Normal mood.   Data Reviewed:  EKG revealed atrial fibrillation at 75 bpm with minimal ST depressions noted in diffuse leads.  Reviewed labs, imaging, and pertinent records as documented.  Assessment and Plan:  Dysphagia History of  esophagitis and gastritis Patient presents with 1.5-week history of difficulty swallowing solids and liquids.  Records note prior history of EGD 06/2022 which noted moderate chronic gastritis and severe esophagitis.  [facility] have been formally consulted.  He does not appear to be on a PPI.  Question possibility of previous occurrence of esophagitis/gastritis versus stricture versus obstruction. - Admit to medical telemetry bed - Aspiration precautions with elevation head of bed - Clear liquid diet and n.p.o. after midnight - Protonix  IV - [facility] consulted, will follow-up for any further recommendations  Chronic atrial fibrillation on chronic anticoagulation Subtherapeutic INR Patient is currently in atrial fibrillation, but rate controlled.  INR subtherapeutic at 1.8.  CHA2DS2-VASc 2 score equal to 7. - Continue metoprolol  nightly - Hold Coumadin  - Heparin  per pharmacy  Diabetes mellitus type 2, without long-term use of insulin  On admission glucose noted to be 173.  Medication regimen includes glipizide  and metformin . - Hypoglycemic protocols - Hold metformin  and glipizide  - CBGs before every meal with sensitive SSI  Hypertension On admission blood pressures noted to be elevated up to 176/96. - Continue benazepril  and metoprolol  nightly  History of CVA Coronary artery disease Hyperlipidemia Patient denies any complaints of chest pain at this time. - Continue simvastatin   Thrombocytopenia Chronic.  Platelets 119. - Continue to monitor  BPH - Continue finasteride   DVT prophylaxis: None, due to need of procedure advance Care Planning:   Code Status: Full Code     Consults: Gastroenterology  Family Communication: Son updated at bedside  Severity of Illness: The appropriate patient status for this patient is OBSERVATION. Observation status is judged to be reasonable and necessary in order to provide the required intensity of service to ensure the patient's  safety. The patient's presenting symptoms, physical exam findings, and initial radiographic and laboratory data in the context of their medical condition is felt to place them at decreased risk for further clinical deterioration. Furthermore, it is anticipated that the patient will be medically stable for discharge from the hospital within 2 midnights of admission.   Author: Maximino DELENA Sharps, MD 09/07/2023 9:49 AM  For on call review [email or web address].

## 2023-09-07 NOTE — Progress Notes (Addendum)
 Patient noted to have intermittent episodes of bradycardia with heart rates reported to be in 30s on telemetry.  He had received home dose of metoprolol  50 mg this afternoon instead of nightly like he normally takes at home which could have been a cause.  Patient denies having any symptoms of lightheadedness or chest pain related to this.   Orders placed to give patient glucagon  1 mg IV, and check EKG.  Symptoms may be secondary to him receiving his metoprolol  too early.  Follow-up telemetry overnight and may need decrease in dosage if noted to have any issues overnight.

## 2023-09-08 DIAGNOSIS — R1310 Dysphagia, unspecified: Secondary | ICD-10-CM | POA: Diagnosis not present

## 2023-09-08 DIAGNOSIS — R070 Pain in throat: Secondary | ICD-10-CM | POA: Diagnosis not present

## 2023-09-08 LAB — GLUCOSE, CAPILLARY
Glucose-Capillary: 122 mg/dL — ABNORMAL HIGH (ref 70–99)
Glucose-Capillary: 173 mg/dL — ABNORMAL HIGH (ref 70–99)
Glucose-Capillary: 146 mg/dL — ABNORMAL HIGH (ref 70–99)

## 2023-09-08 LAB — BASIC METABOLIC PANEL WITH GFR
Anion gap: 7 (ref 5–15)
BUN: 10 mg/dL (ref 8–23)
CO2: 28 mmol/L (ref 22–32)
Calcium: 9.3 mg/dL (ref 8.9–10.3)
Chloride: 104 mmol/L (ref 98–111)
Creatinine, Ser: 0.92 mg/dL (ref 0.61–1.24)
GFR, Estimated: >60 mL/min (ref >60)
Glucose, Bld: 116 mg/dL — ABNORMAL HIGH (ref 70–99)
Potassium: 4.3 mmol/L (ref 3.5–5.1)
Sodium: 139 mmol/L (ref 135–145)

## 2023-09-08 LAB — CBC
HCT: 47 % (ref 39.0–52.0)
Hemoglobin: 15.5 g/dL (ref 13.0–17.0)
MCH: 31.2 pg (ref 26.0–34.0)
MCHC: 33 g/dL (ref 30.0–36.0)
MCV: 94.6 fL (ref 80.0–100.0)
Platelets: 122 K/uL — ABNORMAL LOW (ref 150–400)
RBC: 4.97 MIL/uL (ref 4.22–5.81)
RDW: 13.5 % (ref 11.5–15.5)
WBC: 5.7 K/uL (ref 4.0–10.5)
nRBC: 0 % (ref 0.0–0.2)

## 2023-09-08 LAB — HEPARIN LEVEL (UNFRACTIONATED): Heparin Unfractionated: 0.61 [IU]/mL (ref 0.30–0.70)

## 2023-09-08 MED ORDER — METOPROLOL TARTRATE 25 MG PO TABS
25.0000 mg | ORAL_TABLET | Freq: Every day | ORAL | Status: DC
  Administered 2023-09-08 – 2023-09-10 (×3): 25 mg via ORAL
  Filled 2023-09-08 (×3): qty 1

## 2023-09-08 NOTE — Progress Notes (Signed)
 Transition of Care [facility]) - Inpatient Brief Assessment   Patient Details  Name: Eric Gallagher MRN: [id number] Date of Birth: [birth year]  Transition of [facility]) CM/SW Contact:    Rosaline JONELLE Joe, RN Phone Number: 09/08/2023, 10:44 AM   Clinical Narrative: Patient admitted for Dysphagia.   No IP Care needs at this time.  Please place [facility] consult if needs arise for the patient.   Transition of Care Asessment: Insurance and Status: (P) Insurance coverage has been reviewed Patient has primary care physician: (P) Yes Home environment has been reviewed: (P) from home   Prior/Current Home Services: (P) No current home services Social Drivers of Health Review: (P) SDOH reviewed no interventions necessary Readmission risk has been reviewed: (P) Yes Transition of care needs: (P) no transition of care needs at this time

## 2023-09-08 NOTE — Progress Notes (Signed)
 PROGRESS NOTE Eric Gallagher  FMW:[id number] DOB: [birth year] DOA: 09/07/2023 PCP: Ransom Other, MD  Brief Narrative/Hospital Course: 83 year old male with complex medical history including HTN, HLD, CAD, CVA, AF on Coumadin  presented with dysphagia.  Patient has previous history of esophagitis and gastritis last year.  In the ED hemodynamically stable, labs fairly unremarkable INR 1.8.  Placed on heparin  drip Gastroenterology consulted and admitted   Subjective: Seen and examined today Episode of bradycardia x 1 had taken home beta-blocker earlier than usual No recurrence of bradycardia BP stable Endoscopy has been pushed for tomorrow after holding heparin  in the morning He has no new complaint, his son is at the bedside  Assessment and plan:  Dysphagia Odynophagia History of LA grade D esophagitis and diffuse gastritis: GI has been consulted with plan for EGD> which is post for tomorrow. Continue IV PPI every 12 hours Coumadin  on hold> on heparin , hold preop  Chronic A-fib on Coumadin  Episode of bradycardia 7/29: INR subtherapeutic Coumadin  on hold for above, placed on heparin  drip, pharmacy managing.  On metoprolol  nightly continue as heart rate allows.  T2DM: Holding metformin  and glipizide  continue SSI Recent Labs  Lab 09/07/23 0838 09/07/23 1556 09/07/23 2112 09/08/23 0733  GLUCAP  --  158* 127* 122*  HGBA1C 7.5*  --   --   --     Hypertension: BP borderline controlled.  On metoprolol  and benazepril  continue as able  History of CVA CAD HLD: No chest pain.  On statin.  Thrombocytopenia, chronic: Mildly low.  Monitor  BPH: Continue finasteride    DVT prophylaxis: Heparin  Code Status:   Code Status: Full Code Family Communication: plan of care discussed with patient at bedside. Patient status is: Remains hospitalized because of severity of illness Level of care: Telemetry Medical   Dispo: The patient is from: HOME            Anticipated disposition:  TBD Objective: Vitals last 24 hrs: Vitals:   09/07/23 1500 09/07/23 2100 09/08/23 0650 09/08/23 0734  BP: (!) 128/92 121/78 (!) 179/89 (!) 159/110  Pulse: 62 (!) 58 75 67  Resp: 18  18   Temp: (!) 97.5 F (36.4 C) 97.6 F (36.4 C) 97.6 F (36.4 C) 97.7 F (36.5 C)  TempSrc: Oral Oral Oral Oral  SpO2: 98% 96% 100% 99%  Weight:      Height:        Physical Examination: General exam: alert awake, oriented, older than stated age HEENT:Oral mucosa moist, Ear/Nose WNL grossly Respiratory system: Bilaterally clear BS,no use of accessory muscle Cardiovascular system: S1 & S2 +, No JVD. Gastrointestinal system: Abdomen soft,NT,ND, BS+ Nervous System: Alert, awake, moving all extremities,and following commands. Extremities: LE edema neg, distal extremities warm.  Skin: No rashes,no icterus. MSK: Normal muscle bulk,tone, power     Data Reviewed: I have personally reviewed following labs and imaging studies ( see epic result tab) CBC: Recent Labs  Lab 09/07/23 0839 09/08/23 0227  WBC 5.7 5.7  NEUTROABS 4.2  --   HGB 15.6 15.5  HCT 47.4 47.0  MCV 95.0 94.6  PLT 119* 122*   CMP: Recent Labs  Lab 09/07/23 0839 09/08/23 0227  NA 136 139  K 4.3 4.3  CL 102 104  CO2 27 28  GLUCOSE 173* 116*  BUN 15 10  CREATININE 0.97 0.92  CALCIUM 9.3 9.3   GFR: Estimated Creatinine Clearance: 69.5 mL/min (by C-G formula based on SCr of 0.92 mg/dL). Recent Labs  Lab 09/07/23 0839  AST 19  ALT 16  ALKPHOS 49  BILITOT 0.8  PROT 6.8  ALBUMIN  3.7    Recent Labs  Lab 09/07/23 0839  LIPASE 35   No results for input(s): AMMONIA in the last 168 hours. Coagulation Profile:  Recent Labs  Lab 09/07/23 0839  INR 1.8*   Unresulted Labs (From admission, onward)     Start     Ordered   09/09/23 0500  Heparin  level (unfractionated)  Daily,   R      09/07/23 1752   09/08/23 0500  CBC  Daily,   R      09/07/23 1752           Antimicrobials/Microbiology: Anti-infectives (From  admission, onward)    None      No results found for: SDES, SPECREQUEST, CULT, REPTSTATUS  Procedures: Procedure(s) (LRB): EGD (ESOPHAGOGASTRODUODENOSCOPY) (N/A) Medications reviewed:  Scheduled Meds:  benazepril   20 mg Oral QHS   finasteride   5 mg Oral QHS   insulin  aspart  0-5 Units Subcutaneous QHS   insulin  aspart  0-9 Units Subcutaneous TID WC   metoprolol  tartrate  50 mg Oral QHS   pantoprazole  (PROTONIX ) IV  40 mg Intravenous Q12H   simvastatin   40 mg Oral QPM   Continuous Infusions:  heparin  1,300 Units/hr (09/07/23 1821)    Eric LAMY, MD [facility] 09/08/2023, 10:31 AM

## 2023-09-08 NOTE — Plan of Care (Signed)

## 2023-09-08 NOTE — H&P (View-Only) (Signed)
 [facility] Progress Note  SUBJECTIVE:   Interval history: Eric Gallagher was seen and evaluated today at bedside. Resting comfortably with son at bedside. Tolerating oral intake, does have dysphagia and odynophagia. No chest pain or shortness of breath. Heparin  drip not held prior to EGD, delay EGD until 09/09/23.   Past Medical History:  Diagnosis Date   Anginal pain (HCC)    h/o    Arrhythmia    Chronic AFIB, no anti-coag patient desire and low platelet count   Arthritis    both hips    BPH (benign prostatic hyperplasia)    Chronic a-fib (HCC)    Coronary artery disease    NSTEMI in 2005, Taxus DES LAD   Diabetes mellitus without complication (HCC)    type II, some neuropathy   Dysrhythmia    afib   ED (erectile dysfunction)    History of kidney stones 2012   passed spontaneously   Hyperlipidemia    Hypertension    Old MI (myocardial infarction)    Stroke (HCC) 2017   Thrombocytopenia (HCC)    chronic   Past Surgical History:  Procedure Laterality Date   APPENDECTOMY     as a teen   BIOPSY  07/07/2022   Procedure: BIOPSY;  Surgeon: Elicia Claw, MD;  Location: [facility];  Service: Gastroenterology;;   CARDIAC CATHETERIZATION N/A 07/24/2014   Procedure: Right/Left Heart Cath and Coronary Angiography;  Surgeon: Victory LELON Sharps, MD;  Location: [facility] INVASIVE CV LAB;  Service: Cardiovascular;  Laterality: N/A;   ESOPHAGOGASTRODUODENOSCOPY (EGD) WITH PROPOFOL  N/A 07/07/2022   Procedure: ESOPHAGOGASTRODUODENOSCOPY (EGD) WITH PROPOFOL ;  Surgeon: Elicia Claw, MD;  Location: [facility]OS[facility];  Service: Gastroenterology;  Laterality: N/A;   EYE SURGERY Left 2016   retinal surgery, cataracts removed- both eyes   HERNIA REPAIR  1998   L inguinal    I & D EXTREMITY Right 06/18/2014   Procedure: OPEN TREATMENT RIGHT THUMB, INCOMPLETE AMPUTATION.  Washing and dressing applied to left thumb.;  Surgeon: Alm Hummer, MD;  Location: [facility] OR;  Service: Orthopedics;   Laterality: Right;   TOTAL HIP ARTHROPLASTY Right 01/04/2018   Procedure: RIGHT TOTAL HIP ARTHROPLASTY ANTERIOR APPROACH;  Surgeon: Vernetta Lonni GRADE, MD;  Location: [facility] OR;  Service: Orthopedics;  Laterality: Right;   TOTAL HIP ARTHROPLASTY Left 03/01/2018   Procedure: LEFT TOTAL HIP ARTHROPLASTY ANTERIOR APPROACH;  Surgeon: Vernetta Lonni GRADE, MD;  Location: [facility] OR;  Service: Orthopedics;  Laterality: Left;   Current Facility-Administered Medications  Medication Dose Route Frequency Provider Last Rate Last Admin   acetaminophen  (TYLENOL ) tablet 650 mg  650 mg Oral Q6H PRN Smith, Rondell A, MD       Or   acetaminophen  (TYLENOL ) suppository 650 mg  650 mg Rectal Q6H PRN Sharps Reeves A, MD       albuterol  (PROVENTIL ) (2.5 MG/3ML) 0.083% nebulizer solution 2.5 mg  2.5 mg Nebulization Q6H PRN Smith, Rondell A, MD       benazepril  (LOTENSIN ) tablet 20 mg  20 mg Oral QHS Smith, Rondell A, MD   20 mg at 09/07/23 2102   finasteride  (PROSCAR ) tablet 5 mg  5 mg Oral QHS Smith, Rondell A, MD   5 mg at 09/07/23 2103   heparin  ADULT infusion 100 units/mL (25000 units/250mL)  1,300 Units/hr Intravenous Continuous Ygnacio Limerick, RPH 13 mL/hr at 09/07/23 1821 1,300 Units/hr at 09/07/23 1821   insulin  aspart (novoLOG ) injection 0-5 Units  0-5 Units Subcutaneous QHS Sharps Reeves LABOR, MD  insulin  aspart (novoLOG ) injection 0-9 Units  0-9 Units Subcutaneous TID WC Smith, Rondell A, MD   2 Units at 09/08/23 1144   metoprolol  tartrate (LOPRESSOR ) tablet 25 mg  25 mg Oral QHS Kc, Ramesh, MD       pantoprazole  (PROTONIX ) injection 40 mg  40 mg Intravenous Q12H Smith, Rondell A, MD   40 mg at 09/08/23 0845   simvastatin  (ZOCOR ) tablet 40 mg  40 mg Oral QPM Smith, Rondell A, MD   40 mg at 09/07/23 1705   Allergies as of 09/07/2023 - Review Complete 09/07/2023  Allergen Reaction Noted   Flomax [tamsulosin hcl] Other (See Comments) 02/07/2014   Review of Systems:  Review of Systems  Respiratory:   Negative for shortness of breath.   Cardiovascular:  Negative for chest pain.  Gastrointestinal:  Negative for abdominal pain, nausea and vomiting.       Dysphagia    OBJECTIVE:   Temp:  [97.5 F (36.4 C)-97.8 F (36.6 C)] 97.8 F (36.6 C) (07/30 1142) Pulse Rate:  [58-75] 69 (07/30 1142) Resp:  [18] 18 (07/30 0650) BP: (121-179)/(78-110) 139/87 (07/30 1142) SpO2:  [96 %-100 %] 97 % (07/30 1142)   Physical Exam Constitutional:      General: He is not in acute distress.    Appearance: He is not ill-appearing, toxic-appearing or diaphoretic.  Pulmonary:     Effort: No respiratory distress.  Abdominal:     General: There is no distension.     Palpations: Abdomen is soft.     Tenderness: There is no abdominal tenderness.  Neurological:     Mental Status: He is alert.     Labs: Recent Labs    09/07/23 0839 09/08/23 0227  WBC 5.7 5.7  HGB 15.6 15.5  HCT 47.4 47.0  PLT 119* 122*   BMET Recent Labs    09/07/23 0839 09/08/23 0227  NA 136 139  K 4.3 4.3  CL 102 104  CO2 27 28  GLUCOSE 173* 116*  BUN 15 10  CREATININE 0.97 0.92  CALCIUM 9.3 9.3   LFT Recent Labs    09/07/23 0839  PROT 6.8  ALBUMIN  3.7  AST 19  ALT 16  ALKPHOS 49  BILITOT 0.8   PT/INR Recent Labs    09/07/23 0839  LABPROT 21.8*  INR 1.8*   Diagnostic imaging: DG Chest Portable 1 View Result Date: 09/07/2023 CLINICAL DATA:  Pain. EXAM: PORTABLE CHEST 1 VIEW COMPARISON:  12/18/2022. FINDINGS: Bilateral lung fields are clear. Bilateral costophrenic angles are clear. Note is made of elevated left hemidiaphragm. Normal cardio-mediastinal silhouette. No acute osseous abnormalities. The soft tissues are within normal limits. IMPRESSION: No active disease. Electronically Signed   By: Ree Molt M.D.   On: 09/07/2023 08:54   IMPRESSION: Dysphagia  Odynophagia  History of LA grade D esophagitis History diffuse gastritis   PLAN: -EGD 09/09/23, hold heparin  6 hours prior to procedure -Ok  for diet today, NPO at midnight -Continue PPI therapy  -Further recommendations to follow pending procedure    LOS: 0 days   Estefana Keas, [facility]

## 2023-09-08 NOTE — Plan of Care (Signed)
 Problem: Education: Goal: Ability to describe self-care measures that may prevent or decrease complications (Diabetes Survival Skills Education) will improve 09/08/2023 1217 by Carmen Reche BRAVO, RN Outcome: Progressing 09/08/2023 1014 by Carmen Reche BRAVO, RN Outcome: Progressing Goal: Individualized Educational Video(s) 09/08/2023 1217 by Carmen Reche BRAVO, RN Outcome: Progressing 09/08/2023 1014 by Carmen Reche BRAVO, RN Outcome: Progressing   Problem: Coping: Goal: Ability to adjust to condition or change in health will improve 09/08/2023 1217 by Carmen Reche BRAVO, RN Outcome: Progressing 09/08/2023 1014 by Carmen Reche BRAVO, RN Outcome: Progressing   Problem: Fluid Volume: Goal: Ability to maintain a balanced intake and output will improve 09/08/2023 1217 by Carmen Reche BRAVO, RN Outcome: Progressing 09/08/2023 1014 by Carmen Reche BRAVO, RN Outcome: Progressing   Problem: Health Behavior/Discharge Planning: Goal: Ability to identify and utilize available resources and services will improve 09/08/2023 1217 by Carmen Reche BRAVO, RN Outcome: Progressing 09/08/2023 1014 by Carmen Reche BRAVO, RN Outcome: Progressing Goal: Ability to manage health-related needs will improve 09/08/2023 1217 by Carmen Reche BRAVO, RN Outcome: Progressing 09/08/2023 1014 by Carmen Reche BRAVO, RN Outcome: Progressing   Problem: Metabolic: Goal: Ability to maintain appropriate glucose levels will improve 09/08/2023 1217 by Carmen Reche BRAVO, RN Outcome: Progressing 09/08/2023 1014 by Carmen Reche BRAVO, RN Outcome: Progressing   Problem: Nutritional: Goal: Maintenance of adequate nutrition will improve 09/08/2023 1217 by Carmen Reche BRAVO, RN Outcome: Progressing 09/08/2023 1014 by Carmen Reche BRAVO, RN Outcome: Progressing Goal: Progress toward achieving an optimal weight will improve 09/08/2023 1217 by Carmen Reche BRAVO, RN Outcome: Progressing 09/08/2023 1014 by Carmen Reche BRAVO, RN Outcome:  Progressing   Problem: Skin Integrity: Goal: Risk for impaired skin integrity will decrease 09/08/2023 1217 by Carmen Reche BRAVO, RN Outcome: Progressing 09/08/2023 1014 by Carmen Reche BRAVO, RN Outcome: Progressing   Problem: Tissue Perfusion: Goal: Adequacy of tissue perfusion will improve 09/08/2023 1217 by Carmen Reche BRAVO, RN Outcome: Progressing 09/08/2023 1014 by Carmen Reche BRAVO, RN Outcome: Progressing   Problem: Education: Goal: Knowledge of General Education information will improve Description: Including pain rating scale, medication(s)/side effects and non-pharmacologic comfort measures 09/08/2023 1217 by Carmen Reche BRAVO, RN Outcome: Progressing 09/08/2023 1014 by Carmen Reche BRAVO, RN Outcome: Progressing   Problem: Health Behavior/Discharge Planning: Goal: Ability to manage health-related needs will improve 09/08/2023 1217 by Carmen Reche BRAVO, RN Outcome: Progressing 09/08/2023 1014 by Carmen Reche BRAVO, RN Outcome: Progressing   Problem: Clinical Measurements: Goal: Ability to maintain clinical measurements within normal limits will improve 09/08/2023 1217 by Carmen Reche BRAVO, RN Outcome: Progressing 09/08/2023 1014 by Carmen Reche BRAVO, RN Outcome: Progressing Goal: Will remain free from infection 09/08/2023 1217 by Carmen Reche BRAVO, RN Outcome: Progressing 09/08/2023 1014 by Carmen Reche BRAVO, RN Outcome: Progressing Goal: Diagnostic test results will improve 09/08/2023 1217 by Carmen Reche BRAVO, RN Outcome: Progressing 09/08/2023 1014 by Carmen Reche BRAVO, RN Outcome: Progressing Goal: Respiratory complications will improve 09/08/2023 1217 by Carmen Reche BRAVO, RN Outcome: Progressing 09/08/2023 1014 by Carmen Reche BRAVO, RN Outcome: Progressing Goal: Cardiovascular complication will be avoided 09/08/2023 1217 by Carmen Reche BRAVO, RN Outcome: Progressing 09/08/2023 1014 by Carmen Reche BRAVO, RN Outcome: Progressing   Problem: Activity: Goal: Risk  for activity intolerance will decrease 09/08/2023 1217 by Carmen Reche BRAVO, RN Outcome: Progressing 09/08/2023 1014 by Carmen Reche BRAVO, RN Outcome: Progressing   Problem: Nutrition: Goal: Adequate nutrition will be maintained 09/08/2023 1217 by Carmen Reche BRAVO, RN Outcome: Progressing 09/08/2023 1014 by Carmen Reche BRAVO, RN Outcome: Progressing  Problem: Coping: Goal: Level of anxiety will decrease 09/08/2023 1217 by Carmen Reche BRAVO, RN Outcome: Progressing 09/08/2023 1014 by Carmen Reche BRAVO, RN Outcome: Progressing   Problem: Elimination: Goal: Will not experience complications related to bowel motility 09/08/2023 1217 by Carmen Reche BRAVO, RN Outcome: Progressing 09/08/2023 1014 by Carmen Reche BRAVO, RN Outcome: Progressing Goal: Will not experience complications related to urinary retention 09/08/2023 1217 by Carmen Reche BRAVO, RN Outcome: Progressing 09/08/2023 1014 by Carmen Reche BRAVO, RN Outcome: Progressing   Problem: Pain Managment: Goal: General experience of comfort will improve and/or be controlled 09/08/2023 1217 by Carmen Reche BRAVO, RN Outcome: Progressing 09/08/2023 1014 by Carmen Reche BRAVO, RN Outcome: Progressing   Problem: Safety: Goal: Ability to remain free from injury will improve 09/08/2023 1217 by Carmen Reche BRAVO, RN Outcome: Progressing 09/08/2023 1014 by Carmen Reche BRAVO, RN Outcome: Progressing   Problem: Skin Integrity: Goal: Risk for impaired skin integrity will decrease 09/08/2023 1217 by Carmen Reche BRAVO, RN Outcome: Progressing 09/08/2023 1014 by Carmen Reche BRAVO, RN Outcome: Progressing

## 2023-09-08 NOTE — Progress Notes (Signed)
 [facility] Progress Note  SUBJECTIVE:   Interval history: Eric Gallagher was seen and evaluated today at bedside. Resting comfortably with son at bedside. Tolerating oral intake, does have dysphagia and odynophagia. No chest pain or shortness of breath. Heparin  drip not held prior to EGD, delay EGD until 09/09/23.   Past Medical History:  Diagnosis Date   Anginal pain (HCC)    h/o    Arrhythmia    Chronic AFIB, no anti-coag patient desire and low platelet count   Arthritis    both hips    BPH (benign prostatic hyperplasia)    Chronic a-fib (HCC)    Coronary artery disease    NSTEMI in 2005, Taxus DES LAD   Diabetes mellitus without complication (HCC)    type II, some neuropathy   Dysrhythmia    afib   ED (erectile dysfunction)    History of kidney stones 2012   passed spontaneously   Hyperlipidemia    Hypertension    Old MI (myocardial infarction)    Stroke (HCC) 2017   Thrombocytopenia (HCC)    chronic   Past Surgical History:  Procedure Laterality Date   APPENDECTOMY     as a teen   BIOPSY  07/07/2022   Procedure: BIOPSY;  Surgeon: Elicia Claw, MD;  Location: [facility]OSCOPY;  Service: Gastroenterology;;   CARDIAC CATHETERIZATION N/A 07/24/2014   Procedure: Right/Left Heart Cath and Coronary Angiography;  Surgeon: Victory LELON Sharps, MD;  Location: [facility] INVASIVE CV LAB;  Service: Cardiovascular;  Laterality: N/A;   ESOPHAGOGASTRODUODENOSCOPY (EGD) WITH PROPOFOL  N/A 07/07/2022   Procedure: ESOPHAGOGASTRODUODENOSCOPY (EGD) WITH PROPOFOL ;  Surgeon: Elicia Claw, MD;  Location: [facility] ENDOSCOPY;  Service: Gastroenterology;  Laterality: N/A;   EYE SURGERY Left 2016   retinal surgery, cataracts removed- both eyes   HERNIA REPAIR  1998   L inguinal    I & D EXTREMITY Right 06/18/2014   Procedure: OPEN TREATMENT RIGHT THUMB, INCOMPLETE AMPUTATION.  Washing and dressing applied to left thumb.;  Surgeon: Alm Hummer, MD;  Location: [facility] OR;  Service: Orthopedics;   Laterality: Right;   TOTAL HIP ARTHROPLASTY Right 01/04/2018   Procedure: RIGHT TOTAL HIP ARTHROPLASTY ANTERIOR APPROACH;  Surgeon: Vernetta Lonni GRADE, MD;  Location: [facility] OR;  Service: Orthopedics;  Laterality: Right;   TOTAL HIP ARTHROPLASTY Left 03/01/2018   Procedure: LEFT TOTAL HIP ARTHROPLASTY ANTERIOR APPROACH;  Surgeon: Vernetta Lonni GRADE, MD;  Location: [facility] OR;  Service: Orthopedics;  Laterality: Left;   Current Facility-Administered Medications  Medication Dose Route Frequency Provider Last Rate Last Admin   acetaminophen  (TYLENOL ) tablet 650 mg  650 mg Oral Q6H PRN Smith, Rondell A, MD       Or   acetaminophen  (TYLENOL ) suppository 650 mg  650 mg Rectal Q6H PRN Sharps Reeves A, MD       albuterol  (PROVENTIL ) (2.5 MG/3ML) 0.083% nebulizer solution 2.5 mg  2.5 mg Nebulization Q6H PRN Smith, Rondell A, MD       benazepril  (LOTENSIN ) tablet 20 mg  20 mg Oral QHS Smith, Rondell A, MD   20 mg at 09/07/23 2102   finasteride  (PROSCAR ) tablet 5 mg  5 mg Oral QHS Smith, Rondell A, MD   5 mg at 09/07/23 2103   heparin  ADULT infusion 100 units/mL (25000 units/250mL)  1,300 Units/hr Intravenous Continuous Ygnacio Limerick, RPH 13 mL/hr at 09/07/23 1821 1,300 Units/hr at 09/07/23 1821   insulin  aspart (novoLOG ) injection 0-5 Units  0-5 Units Subcutaneous QHS Sharps Reeves LABOR, MD  insulin  aspart (novoLOG ) injection 0-9 Units  0-9 Units Subcutaneous TID WC Smith, Rondell A, MD   2 Units at 09/08/23 1144   metoprolol  tartrate (LOPRESSOR ) tablet 25 mg  25 mg Oral QHS Kc, Ramesh, MD       pantoprazole  (PROTONIX ) injection 40 mg  40 mg Intravenous Q12H Smith, Rondell A, MD   40 mg at 09/08/23 0845   simvastatin  (ZOCOR ) tablet 40 mg  40 mg Oral QPM Smith, Rondell A, MD   40 mg at 09/07/23 1705   Allergies as of 09/07/2023 - Review Complete 09/07/2023  Allergen Reaction Noted   Flomax [tamsulosin hcl] Other (See Comments) 02/07/2014   Review of Systems:  Review of Systems  Respiratory:   Negative for shortness of breath.   Cardiovascular:  Negative for chest pain.  Gastrointestinal:  Negative for abdominal pain, nausea and vomiting.       Dysphagia    OBJECTIVE:   Temp:  [97.5 F (36.4 C)-97.8 F (36.6 C)] 97.8 F (36.6 C) (07/30 1142) Pulse Rate:  [58-75] 69 (07/30 1142) Resp:  [18] 18 (07/30 0650) BP: (121-179)/(78-110) 139/87 (07/30 1142) SpO2:  [96 %-100 %] 97 % (07/30 1142)   Physical Exam Constitutional:      General: He is not in acute distress.    Appearance: He is not ill-appearing, toxic-appearing or diaphoretic.  Pulmonary:     Effort: No respiratory distress.  Abdominal:     General: There is no distension.     Palpations: Abdomen is soft.     Tenderness: There is no abdominal tenderness.  Neurological:     Mental Status: He is alert.     Labs: Recent Labs    09/07/23 0839 09/08/23 0227  WBC 5.7 5.7  HGB 15.6 15.5  HCT 47.4 47.0  PLT 119* 122*   BMET Recent Labs    09/07/23 0839 09/08/23 0227  NA 136 139  K 4.3 4.3  CL 102 104  CO2 27 28  GLUCOSE 173* 116*  BUN 15 10  CREATININE 0.97 0.92  CALCIUM 9.3 9.3   LFT Recent Labs    09/07/23 0839  PROT 6.8  ALBUMIN  3.7  AST 19  ALT 16  ALKPHOS 49  BILITOT 0.8   PT/INR Recent Labs    09/07/23 0839  LABPROT 21.8*  INR 1.8*   Diagnostic imaging: DG Chest Portable 1 View Result Date: 09/07/2023 CLINICAL DATA:  Pain. EXAM: PORTABLE CHEST 1 VIEW COMPARISON:  12/18/2022. FINDINGS: Bilateral lung fields are clear. Bilateral costophrenic angles are clear. Note is made of elevated left hemidiaphragm. Normal cardio-mediastinal silhouette. No acute osseous abnormalities. The soft tissues are within normal limits. IMPRESSION: No active disease. Electronically Signed   By: Ree Molt M.D.   On: 09/07/2023 08:54   IMPRESSION: Dysphagia  Odynophagia  History of LA grade D esophagitis History diffuse gastritis   PLAN: -EGD 09/09/23, hold heparin  6 hours prior to procedure -Ok  for diet today, NPO at midnight -Continue PPI therapy  -Further recommendations to follow pending procedure    LOS: 0 days   Estefana Keas, [facility]

## 2023-09-08 NOTE — Progress Notes (Signed)
 PHARMACY - ANTICOAGULATION  Pharmacy Consult for heparin  Indication: atrial fibrillation Brief A/P: Heparin  level within goal range Continue Heparin  at current rate   Allergies  Allergen Reactions   Flomax [Tamsulosin Hcl] Other (See Comments)    Dizzy    Patient Measurements: Height: 6' 2 (188 cm) Weight: 88.5 kg (195 lb) IBW/kg (Calculated) : 82.2 HEPARIN  DW (KG): 88.5  Vital Signs: Temp: 97.6 F (36.4 C) (07/29 2100) Temp Source: Oral (07/29 2100) BP: 121/78 (07/29 2100) Pulse Rate: 58 (07/29 2100)  Labs: Recent Labs    09/07/23 0839 09/07/23 1023 09/08/23 0227  HGB 15.6  --  15.5  HCT 47.4  --  47.0  PLT 119*  --  122*  LABPROT 21.8*  --   --   INR 1.8*  --   --   HEPARINUNFRC  --   --  0.61  CREATININE 0.97  --  0.92  TROPONINIHS 8 7  --     Estimated Creatinine Clearance: 69.5 mL/min (by C-G formula based on SCr of 0.92 mg/dL).  Assessment: 84 y.o. male with h/o Afib, Coumadin  on hold and INR subtherapeutic, for heparin   Goal of Therapy:  INR 2-3 Heparin  level 0.3-0.7 units/ml Monitor platelets by anticoagulation protocol: Yes   Plan:  No change to heparin  F/U after procedure today  Cathlyn Arrant, PharmD, BCPS   09/08/2023 3:51 AM

## 2023-09-08 NOTE — Care Management Obs Status (Signed)
 MEDICARE OBSERVATION STATUS NOTIFICATION   Patient Details  Name: Eric Gallagher MRN: [id number] Date of Birth: Jul 19, 1940   Medicare Observation Status Notification Given:  Yes  Obs letter signed and copy given  Claretta Deed 09/08/2023, 11:58 AM

## 2023-09-08 NOTE — Plan of Care (Signed)

## 2023-09-08 NOTE — Hospital Course (Addendum)
 83 year old male with complex medical history including HTN, HLD, CAD, CVA, AF on Coumadin  presented with dysphagia.  Patient has previous history of esophagitis and gastritis last year.  In the ED hemodynamically stable, labs fairly unremarkable INR 1.8.  Placed on heparin  drip Gastroenterology consulted and admitted. Underwent EGD found to have ulcerative esophagitis, gastritis> heparin  Coumadin  resumed per postprocedure  Subjective: Patient seen and examined Alert awake resting comfortably Overnight afebrile BP stable  Remains on Coumadin /heparin  due to subtherapeutic INR   Assessment and plan:  Dysphagia Odynophagia History of LA grade D esophagitis and diffuse gastritis: Appreciate GI input.  s/p EGD 7/31-ulcerative esophagitis seen, biopsied. Gastritis as well. Continue PPI BID x 2 months, sucralfate  QID x 14 days, viscous lidocaine  TID PRN for 7 days. Per gi Ok to resume heparin /coumadin   Chronic A-fib on Coumadin  Episode of bradycardia 7/29: INR subtherapeutic-patient on heparin  and Coumadin > pharmacy managing once INR close to therapeutic/therapeutic plan to discharge Recent Labs  Lab 09/07/23 0839 09/09/23 1249 09/10/23 0217  INR 1.8* 1.5* 1.6*    T2DM: Well-controlled, holding metformin  and glipizide  continue SSI Recent Labs  Lab 09/07/23 0838 09/07/23 1556 09/09/23 0826 09/09/23 1040 09/09/23 1636 09/09/23 2142 09/10/23 0821  GLUCAP  --    < > 151* 126* 151* 158* 190*  HGBA1C 7.5*  --   --   --   --   --   --    < > = values in this interval not displayed.    Hypertension: BP controlled.On metoprolol  and benazepril  continue as able  History of CVA CAD HLD: No chest pain. Continue statin and coumadin .  Thrombocytopenia, chronic: Mildly low. monitor  BPH: Continue finasteride    DVT prophylaxis: Heparin  Code Status:   Code Status: Full Code Family Communication: plan of care discussed with patient and son at bedside. Patient status is: Remains  hospitalized because of severity of illness Level of care: Telemetry Medical   Dispo: The patient is from: HOME            Anticipated disposition: Anticipating home 1-2 day once INR therapeutic   objective: Vitals last 24 hrs: Vitals:   09/09/23 2146 09/10/23 0041 09/10/23 0548 09/10/23 0821  BP: (!) 153/95 (!) 137/94 (!) 144/87 (!) 139/90  Pulse: 75 66 70 93  Resp: 17 17 18 19   Temp: 97.7 F (36.5 C) 97.9 F (36.6 C) 97.8 F (36.6 C) 98 F (36.7 C)  TempSrc: Oral Oral Oral Oral  SpO2: 98% 99% 99% 98%  Weight:      Height:        Physical Examination: General exam: Alert oriented pleasant  HEENT:Oral mucosa moist, Ear/Nose WNL grossly Respiratory system: Bilaterally clear BS,no use of accessory muscle Cardiovascular system: S1 & S2 +, No JVD. Gastrointestinal system: Abdomen soft,NT,ND, BS+ Nervous System: Alert, awake, moving all extremities,and following commands. Extremities: LE edema neg, distal extremities warm.  Skin: No rashes,no icterus. MSK: Normal muscle bulk,tone, power

## 2023-09-09 ENCOUNTER — Observation Stay (HOSPITAL_COMMUNITY): Admitting: Certified Registered"

## 2023-09-09 ENCOUNTER — Encounter (HOSPITAL_COMMUNITY)
Admission: EM | Disposition: A | Discharge status: Home / Self Care | Payer: Self-pay | Source: Home / Self Care | Attending: Emergency Medicine

## 2023-09-09 DIAGNOSIS — I25110 Atherosclerotic heart disease of native coronary artery with unstable angina pectoris: Secondary | ICD-10-CM | POA: Diagnosis not present

## 2023-09-09 DIAGNOSIS — K209 Esophagitis, unspecified without bleeding: Secondary | ICD-10-CM

## 2023-09-09 DIAGNOSIS — E782 Mixed hyperlipidemia: Secondary | ICD-10-CM | POA: Diagnosis not present

## 2023-09-09 DIAGNOSIS — K2289 Other specified disease of esophagus: Secondary | ICD-10-CM | POA: Diagnosis not present

## 2023-09-09 DIAGNOSIS — K2970 Gastritis, unspecified, without bleeding: Secondary | ICD-10-CM

## 2023-09-09 DIAGNOSIS — I2510 Atherosclerotic heart disease of native coronary artery without angina pectoris: Secondary | ICD-10-CM | POA: Diagnosis not present

## 2023-09-09 DIAGNOSIS — R1310 Dysphagia, unspecified: Secondary | ICD-10-CM | POA: Diagnosis not present

## 2023-09-09 DIAGNOSIS — K2950 Unspecified chronic gastritis without bleeding: Secondary | ICD-10-CM | POA: Diagnosis not present

## 2023-09-09 DIAGNOSIS — K2210 Ulcer of esophagus without bleeding: Secondary | ICD-10-CM | POA: Diagnosis not present

## 2023-09-09 DIAGNOSIS — I25119 Atherosclerotic heart disease of native coronary artery with unspecified angina pectoris: Secondary | ICD-10-CM | POA: Diagnosis not present

## 2023-09-09 DIAGNOSIS — I10 Essential (primary) hypertension: Secondary | ICD-10-CM | POA: Diagnosis not present

## 2023-09-09 HISTORY — PX: ESOPHAGOGASTRODUODENOSCOPY: SHX5428

## 2023-09-09 LAB — CBC
HCT: 48.2 % (ref 39.0–52.0)
Hemoglobin: 15.8 g/dL (ref 13.0–17.0)
MCH: 31.1 pg (ref 26.0–34.0)
MCHC: 32.8 g/dL (ref 30.0–36.0)
MCV: 94.9 fL (ref 80.0–100.0)
Platelets: 134 K/uL — ABNORMAL LOW (ref 150–400)
RBC: 5.08 MIL/uL (ref 4.22–5.81)
RDW: 13.3 % (ref 11.5–15.5)
WBC: 5.5 K/uL (ref 4.0–10.5)
nRBC: 0 % (ref 0.0–0.2)

## 2023-09-09 LAB — GLUCOSE, CAPILLARY
Glucose-Capillary: 151 mg/dL — ABNORMAL HIGH (ref 70–99)
Glucose-Capillary: 126 mg/dL — ABNORMAL HIGH (ref 70–99)
Glucose-Capillary: 151 mg/dL — ABNORMAL HIGH (ref 70–99)
Glucose-Capillary: 158 mg/dL — ABNORMAL HIGH (ref 70–99)

## 2023-09-09 LAB — PROTIME-INR
INR: 1.5 — ABNORMAL HIGH (ref 0.8–1.2)
Prothrombin Time: 18.9 s — ABNORMAL HIGH (ref 11.4–15.2)

## 2023-09-09 LAB — HEPARIN LEVEL (UNFRACTIONATED)
Heparin Unfractionated: >1.1 [IU]/mL — ABNORMAL HIGH (ref 0.30–0.70)
Heparin Unfractionated: 0.51 [IU]/mL (ref 0.30–0.70)

## 2023-09-09 SURGERY — EGD (ESOPHAGOGASTRODUODENOSCOPY)
Anesthesia: Monitor Anesthesia Care

## 2023-09-09 MED ORDER — PROPOFOL 10 MG/ML IV BOLUS
INTRAVENOUS | Status: DC | PRN
  Administered 2023-09-09: 60 mg via INTRAVENOUS

## 2023-09-09 MED ORDER — SUCRALFATE 1 GM/10ML PO SUSP: 1.0000 g | Freq: Three times a day (TID) | ORAL | 0 refills | Status: AC

## 2023-09-09 MED ORDER — LIDOCAINE 2% (20 MG/ML) 5 ML SYRINGE
INTRAMUSCULAR | Status: DC | PRN
  Administered 2023-09-09: 40 mg via INTRAVENOUS

## 2023-09-09 MED ORDER — WARFARIN SODIUM 10 MG PO TABS
10.0000 mg | ORAL_TABLET | Freq: Once | ORAL | Status: AC
  Administered 2023-09-09: 10 mg via ORAL
  Filled 2023-09-09: qty 1

## 2023-09-09 MED ORDER — SUCRALFATE 1 GM/10ML PO SUSP
1.0000 g | Freq: Three times a day (TID) | ORAL | Status: DC
  Administered 2023-09-09 – 2023-09-11 (×7): 1 g via ORAL
  Filled 2023-09-09 (×7): qty 10

## 2023-09-09 MED ORDER — PROPOFOL 500 MG/50ML IV EMUL
INTRAVENOUS | Status: DC | PRN
  Administered 2023-09-09: 150 ug/kg/min via INTRAVENOUS

## 2023-09-09 MED ORDER — LIDOCAINE VISCOUS HCL 2 % MT SOLN: 15.0000 mL | OROMUCOSAL | Status: DC | PRN

## 2023-09-09 MED ORDER — PANTOPRAZOLE SODIUM 40 MG PO TBEC
40.0000 mg | DELAYED_RELEASE_TABLET | Freq: Two times a day (BID) | ORAL | Status: DC
  Administered 2023-09-09 – 2023-09-11 (×5): 40 mg via ORAL
  Filled 2023-09-09 (×5): qty 1

## 2023-09-09 MED ORDER — PANTOPRAZOLE SODIUM 40 MG PO TBEC: 40.0000 mg | DELAYED_RELEASE_TABLET | Freq: Two times a day (BID) | ORAL | 1 refills | Status: AC

## 2023-09-09 MED ORDER — LIDOCAINE VISCOUS HCL 2 % MT SOLN: 15.0000 mL | Freq: Three times a day (TID) | OROMUCOSAL | 0 refills | Status: AC | PRN

## 2023-09-09 MED ORDER — SODIUM CHLORIDE 0.9 % IV SOLN: INTRAVENOUS | Status: DC

## 2023-09-09 MED ORDER — WARFARIN - PHARMACIST DOSING INPATIENT: Freq: Every day | Status: DC

## 2023-09-09 MED ORDER — ESMOLOL HCL 100 MG/10ML IV SOLN
INTRAVENOUS | Status: DC | PRN
  Administered 2023-09-09: 30 mg via INTRAVENOUS

## 2023-09-09 MED ORDER — SODIUM CHLORIDE 0.9 % IV SOLN
INTRAVENOUS | Status: DC | PRN
Start: 2023-09-09 — End: 2023-09-09

## 2023-09-09 MED ORDER — HEPARIN (PORCINE) 25000 UT/250ML-% IV SOLN
800.0000 [IU]/h | INTRAVENOUS | Status: DC
  Administered 2023-09-09: 1100 [IU]/h via INTRAVENOUS
  Administered 2023-09-10: 950 [IU]/h via INTRAVENOUS
  Filled 2023-09-09 (×2): qty 250

## 2023-09-09 NOTE — Progress Notes (Signed)
 Pharmacy Consult for warfarin/heparin .  Indication: atrial fibrillation  Allergies  Allergen Reactions   Flomax [Tamsulosin Hcl] Other (See Comments)    Dizzy    Patient Measurements: Height: 6' 2 (188 cm) Weight: 88.5 kg (195 lb) IBW/kg (Calculated) : 82.2 HEPARIN  DW (KG): 88.5  Vital Signs: Temp: 97.7 F (36.5 C) (07/31 2146) Temp Source: Oral (07/31 2146) BP: 153/95 (07/31 2146) Pulse Rate: 75 (07/31 2146)  Labs: Recent Labs    09/07/23 0839 09/08/23 0227 09/09/23 0202 09/09/23 1249 09/09/23 2142  HGB 15.6 15.5 15.8  --   --   HCT 47.4 47.0 48.2  --   --   PLT 119* 122* 134*  --   --   LABPROT 21.8*  --   --  18.9*  --   INR 1.8*  --   --  1.5*  --   HEPARINUNFRC  --  0.61 >1.10*  --  0.51  CREATININE 0.97 0.92  --   --   --     Estimated Creatinine Clearance: 69.5 mL/min (by C-G formula based on SCr of 0.92 mg/dL).  Assessment: OTHON GUARDIA a 83 y.o. male presented with dysphagia- on warfarin PTA for AF and held for EGD (LD 09/06/23). INR 1.8 on admit. Pharmacy consulted to resume warfarin (PTA dosing 5 mg daily without a dose on Saturdays). Per MD note will continue heparin  until INR therapeutic.   -heparin  level 0.51 and at goal on 1100 units/hr  Goal of Therapy:  INR 2-3 Heparin  level 0.3-0.7 units/ml Monitor platelets by anticoagulation protocol: Yes   Plan:  Continue heparin  infusion at 1,100 units/hour  CBC/INR/HL daily   Prentice Poisson, PharmD Clinical Pharmacist **Pharmacist phone directory can now be found on [email or web address] ([email or web address]).  Listed under [facility].

## 2023-09-09 NOTE — Anesthesia Preprocedure Evaluation (Signed)
 Anesthesia Evaluation  Patient identified by MRN, date of birth, ID band Patient awake    Reviewed: Allergy & Precautions, NPO status , Patient's Chart, lab work & pertinent test results  History of Anesthesia Complications Negative for: history of anesthetic complications  Airway Mallampati: II  TM Distance: >3 FB Neck ROM: Full    Dental  (+) Dental Advisory Given, Teeth Intact, Chipped, Missing,    Pulmonary neg pulmonary ROS, neg sleep apnea, neg COPD, Patient abstained from smoking.Not current smoker, former smoker   breath sounds clear to auscultation       Cardiovascular Exercise Tolerance: Good METShypertension, Pt. on medications + angina  + CAD, + Past MI and + Cardiac Stents  + dysrhythmias Atrial Fibrillation  Rhythm:Irregular Rate:Normal  TTE 2023: 1. Left ventricular ejection fraction, by estimation, is 55 to 60%. Left  ventricular ejection fraction by PLAX is 56 %. The left ventricle has  normal function. The left ventricle has no regional wall motion  abnormalities. There is mild left ventricular  hypertrophy. Left ventricular diastolic function could not be evaluated.   2. Right ventricular systolic function is normal. The right ventricular  size is normal.   3. Left atrial size was mildly dilated.   4. Right atrial size was moderately dilated.   5. The mitral valve is abnormal. Trivial mitral valve regurgitation.   6. The aortic valve is tricuspid. Aortic valve regurgitation is not  visualized.   7. Aortic dilatation noted. 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   8. Rhythm strip during this exam demonstrates atrial fibrillation.      Neuro/Psych CVA, No Residual Symptoms  negative psych ROS   GI/Hepatic Neg liver ROS, PUD,neg GERD  ,,Patient with progressive dysphagia and odynophagia. He is ab le to eat small soft foods slowly. He does not normally vomit. Last solid food was  soft spaghetti at 8pm last night, which he said went down OK without vomiting. Today he denies any feelings of food bolus in his chest or throat, denies any retching/vomiting/gagging.   Endo/Other  diabetes, Type 2, Oral Hypoglycemic Agents    Renal/GU negative Renal ROS     Musculoskeletal  (+) Arthritis ,    Abdominal   Peds  Hematology negative hematology ROS (+)   Anesthesia Other Findings Past Medical History: No date: Anginal pain (HCC)     Comment:  h/o  No date: Arrhythmia     Comment:  Chronic AFIB, no anti-coag patient desire and low               platelet count No date: Arthritis     Comment:  both hips  No date: BPH (benign prostatic hyperplasia) No date: Chronic a-fib (HCC) No date: Coronary artery disease     Comment:  NSTEMI in 2005, Taxus DES LAD No date: Diabetes mellitus without complication (HCC)     Comment:  type II, some neuropathy No date: Dysrhythmia     Comment:  afib No date: ED (erectile dysfunction) 2012: History of kidney stones     Comment:  passed spontaneously No date: Hyperlipidemia No date: Hypertension No date: Old MI (myocardial infarction) 2017: Stroke (HCC) No date: Thrombocytopenia (HCC)     Comment:  chronic  Reproductive/Obstetrics                              Anesthesia Physical Anesthesia Plan  ASA: 3  Anesthesia Plan: MAC  Post-op Pain Management: Minimal or no pain anticipated   Induction: Intravenous  PONV Risk Score and Plan: 1 and Propofol  infusion, TIVA and Ondansetron   Airway Management Planned: Nasal Cannula and Natural Airway  Additional Equipment: None  Intra-op Plan:   Post-operative Plan:   Informed Consent: I have reviewed the patients History and Physical, chart, labs and discussed the procedure including the risks, benefits and alternatives for the proposed anesthesia with the patient or authorized representative who has indicated his/her understanding and acceptance.      Dental advisory given  Plan Discussed with: CRNA and Surgeon  Anesthesia Plan Comments: (Patient is hypertensive in preop. His morning ACE-I med was held due to being transferred for procedure. Patient denies chest pain, SOB, blurry vision, headache. Given that his HTN is likely the result of missing his medication dose, I advised that it would not be unreasonable to proceed today, and that we would address his BP if needed. Patietn understands.  Given lack of foreign body/food bolus sensations today, and NPO since last night, I advised it was not unreasonable to proceed with natural airway, with GETA backup. Discussed risks of anesthesia with patient, including possibility of difficulty with spontaneous ventilation under anesthesia necessitating airway intervention, aspiration, PONV, and rare risks such as cardiac or respiratory or neurological events, and allergic reactions. Discussed the role of CRNA in patient's perioperative care. Patient understands.)         Anesthesia Quick Evaluation

## 2023-09-09 NOTE — Transfer of Care (Signed)
 Immediate Anesthesia Transfer of Care Note  Patient: Eric Gallagher  Procedure(s) Performed: EGD (ESOPHAGOGASTRODUODENOSCOPY)  Patient Location: PACU  Anesthesia Type:MAC  Level of Consciousness: drowsy  Airway & Oxygen Therapy: Patient Spontanous Breathing  Post-op Assessment: Report given to RN and Post -op Vital signs reviewed and stable  Post vital signs: Reviewed and stable  Last Vitals:  Vitals Value Taken Time  BP 129/86 09/09/23 09:40  Temp 36.1 C 09/09/23 09:32  Pulse 81 09/09/23 09:43  Resp 23 09/09/23 09:43  SpO2 93 % 09/09/23 09:43  Vitals shown include unfiled device data.  Last Pain:  Vitals:   09/09/23 0932  TempSrc: Temporal  PainSc: Asleep         Complications: No notable events documented.

## 2023-09-09 NOTE — Progress Notes (Signed)
 PROGRESS NOTE Eric Gallagher  FMW:[id number] DOB: June 02, 1940 DOA: 09/07/2023 PCP: Ransom Other, MD  Brief Narrative/Hospital Course: 83 year old male with complex medical history including HTN, HLD, CAD, CVA, AF on Coumadin  presented with dysphagia.  Patient has previous history of esophagitis and gastritis last year.  In the ED hemodynamically stable, labs fairly unremarkable INR 1.8.  Placed on heparin  drip Gastroenterology consulted and admitted   Subjective: Patient seen and examined Tolerating diet post EGD today Overnight afebrile BP stable CBC remains normal with chronic thrombocytopenia   Assessment and plan:  Dysphagia Odynophagia History of LA grade D esophagitis and diffuse gastritis: GI has been consulted s/p EGD 7/31 'EGD done, ulcerative esophagitis seen, biopsied. Gastritis as well. Oral PPI BID, sucralfate  QID x 14 days, viscous lidocaine  TID PRN for 7 days. Ok to resume heparin /coumadin .  GI signed off  Chronic A-fib on Coumadin  Episode of bradycardia 7/29: INR subtherapeutic- resume coumadin /cont heparin  pending INR check this morning, pharmacy has been consulted pharmacy to manage.On metoprolol  nightly-dose halved for now Recent Labs  Lab 09/07/23 0839  INR 1.8*    T2DM: Well-controlled, holding metformin  and glipizide  continue SSI Recent Labs  Lab 09/07/23 0838 09/07/23 1556 09/08/23 0733 09/08/23 1140 09/08/23 1531 09/09/23 0826 09/09/23 1040  GLUCAP  --    < > 122* 173* 146* 151* 126*  HGBA1C 7.5*  --   --   --   --   --   --    < > = values in this interval not displayed.    Hypertension: BP controlled.On metoprolol  and benazepril  continue as able  History of CVA CAD HLD: No chest pain.  Continue statin.  Thrombocytopenia, chronic: Mildly low. monitor  BPH: Continue finasteride    DVT prophylaxis: Heparin  Code Status:   Code Status: Full Code Family Communication: plan of care discussed with patient at bedside. Patient status is:  Remains hospitalized because of severity of illness Level of care: Telemetry Medical   Dispo: The patient is from: HOME            Anticipated disposition: Anticipating home next 1 to 2 days once INR therapeutic   objective: Vitals last 24 hrs: Vitals:   09/09/23 1000 09/09/23 1010 09/09/23 1041 09/09/23 1141  BP: (!) 134/92 (!) 141/91 (!) 153/92 92/62  Pulse: 72 69 68 (!) 111  Resp: 20 19 19 18   Temp:   (!) 97.3 F (36.3 C) 97.8 F (36.6 C)  TempSrc:   Oral Oral  SpO2: 95% 96% 100% 90%  Weight:      Height:        Physical Examination: General exam: AAOX3 HEENT:Oral mucosa moist, Ear/Nose WNL grossly Respiratory system: Bilaterally clear BS,no use of accessory muscle Cardiovascular system: S1 & S2 +, No JVD. Gastrointestinal system: Abdomen soft,NT,ND, BS+ Nervous System: Alert, awake, moving all extremities,and following commands. Extremities: LE edema neg, distal extremities warm.  Skin: No rashes,no icterus. MSK: Normal muscle bulk,tone, power     Data Reviewed: I have personally reviewed following labs and imaging studies ( see epic result tab) CBC: Recent Labs  Lab 09/07/23 0839 09/08/23 0227 09/09/23 0202  WBC 5.7 5.7 5.5  NEUTROABS 4.2  --   --   HGB 15.6 15.5 15.8  HCT 47.4 47.0 48.2  MCV 95.0 94.6 94.9  PLT 119* 122* 134*   CMP: Recent Labs  Lab 09/07/23 0839 09/08/23 0227  NA 136 139  K 4.3 4.3  CL 102 104  CO2 27 28  GLUCOSE 173* 116*  BUN 15 10  CREATININE 0.97 0.92  CALCIUM 9.3 9.3   GFR: Estimated Creatinine Clearance: 69.5 mL/min (by C-G formula based on SCr of 0.92 mg/dL). Recent Labs  Lab 09/07/23 0839  AST 19  ALT 16  ALKPHOS 49  BILITOT 0.8  PROT 6.8  ALBUMIN  3.7    Recent Labs  Lab 09/07/23 0839  LIPASE 35   No results for input(s): AMMONIA in the last 168 hours. Coagulation Profile:  Recent Labs  Lab 09/07/23 0839  INR 1.8*   Unresulted Labs (From admission, onward)     Start     Ordered   09/09/23 1204   Protime-INR  ONCE - STAT,   STAT        09/09/23 1203   09/09/23 0500  Heparin  level (unfractionated)  Daily,   R      09/07/23 1752   09/08/23 0500  CBC  Daily,   R      09/07/23 1752           Antimicrobials/Microbiology: Anti-infectives (From admission, onward)    None      No results found for: SDES, SPECREQUEST, CULT, REPTSTATUS  Procedures: Procedure(s) (LRB): EGD (ESOPHAGOGASTRODUODENOSCOPY) (N/A) Medications reviewed:  Scheduled Meds:  benazepril   20 mg Oral QHS   finasteride   5 mg Oral QHS   insulin  aspart  0-5 Units Subcutaneous QHS   insulin  aspart  0-9 Units Subcutaneous TID WC   metoprolol  tartrate  25 mg Oral QHS   pantoprazole   40 mg Oral BID   simvastatin   40 mg Oral QPM   sucralfate   1 g Oral TID WC & HS   Continuous Infusions:    Mennie LAMY, MD [facility] 09/09/2023, 12:59 PM

## 2023-09-09 NOTE — Op Note (Signed)
 [facility] Patient Name: Eric Gallagher Procedure Date : 09/09/2023 MRN: [id number] Attending MD: Estefana Keas DO, DO, [id number] Date of Birth: [birth year] CSN: [id number] Age: 83 Admit Type: Inpatient Procedure:                Upper GI endoscopy Indications:              Dysphagia, History LA grade D esophagitis on EGD                            07/07/22. Providers:                Estefana Keas DO, DO, Particia Fischer, RN, Lorrayne Kitty, Technician Referring MD:              Medicines:                See the Anesthesia note for documentation of the                            administered medications Complications:            No immediate complications. Estimated Blood Loss:     Estimated blood loss was minimal. Procedure:                Pre-Anesthesia Assessment:                           - ASA Grade Assessment: III - A patient with severe                            systemic disease.                           - The risks and benefits of the procedure and the                            sedation options and risks were discussed with the                            patient. All questions were answered and informed                            consent was obtained.                           After obtaining informed consent, the endoscope was                            passed under direct vision. Throughout the                            procedure, the patient's blood pressure, pulse, and                            oxygen saturations were monitored continuously. The  [id number]-[id number] ([id number]) Olympus endoscope was introduced                            through the mouth, and advanced to the second part                            of duodenum. The upper GI endoscopy was                            accomplished without difficulty. The patient                            tolerated the procedure well. Scope In: Scope Out: Findings:       LA Grade D (one or more mucosal breaks involving at least 75% of       esophageal circumference) esophagitis with no bleeding was found in the       lower third of the esophagus. Biopsies were taken with a cold forceps       for histology.      Biopsies were obtained from the distal esophagus and proximal esophagus       to rule out eosinophilic esophagitis.      Scattered mild inflammation characterized by erythema was found in the       gastric body. Biopsies were taken with a cold forceps for Helicobacter       pylori testing.      The examined duodenum was normal. Impression:               - LA Grade D esophagitis with no bleeding. Biopsied.                           - Gastritis. Biopsied.                           - Normal examined duodenum. Recommendation:           - Return patient to hospital ward for ongoing care.                           - Soft diet.                           - Continue present medications.                           - Oral PPI BID x 2 months.                           - Start sucralfate  1 gm PO QID x 14 days.                           - Start viscous lidocaine  15 mL Q3Hr PRN for sore                            throat for 7 days.                           -  Ok to resume heparin /anticoagulant therapy.                           - Await pathology results.                           - Repeat upper endoscopy in 2 months for                            surveillance.                           - Follow up with [facility] in office 4 weeks after                            hospital discharge. Procedure Code(s):        --- Professional ---                           [id number], Esophagogastroduodenoscopy, flexible,                            transoral; with biopsy, single or multiple Diagnosis Code(s):        --- Professional ---                           K20.90, Esophagitis, unspecified without bleeding                           K29.70, Gastritis, unspecified, without bleeding                            R13.10, Dysphagia, unspecified CPT copyright 2022 American [employer] Association. All rights reserved. The codes documented in this report are preliminary and upon coder review may  be revised to meet current compliance requirements. Dr Estefana Keas, DO Estefana Keas DO, DO 09/09/2023 10:02:38 AM Number of Addenda: 0

## 2023-09-09 NOTE — Progress Notes (Signed)
 Pharmacy Consult for warfarin/heparin .  Indication: atrial fibrillation  Allergies  Allergen Reactions   Flomax [Tamsulosin Hcl] Other (See Comments)    Dizzy    Patient Measurements: Height: 6' 2 (188 cm) Weight: 88.5 kg (195 lb) IBW/kg (Calculated) : 82.2 HEPARIN  DW (KG): 88.5  Vital Signs: Temp: 97.8 F (36.6 C) (07/31 1141) Temp Source: Oral (07/31 1141) BP: 92/62 (07/31 1141) Pulse Rate: 111 (07/31 1141)  Labs: Recent Labs    09/07/23 0839 09/08/23 0227 09/09/23 0202  HGB 15.6 15.5 15.8  HCT 47.4 47.0 48.2  PLT 119* 122* 134*  LABPROT 21.8*  --   --   INR 1.8*  --   --   HEPARINUNFRC  --  0.61 >1.10*  CREATININE 0.97 0.92  --     Estimated Creatinine Clearance: 69.5 mL/min (by C-G formula based on SCr of 0.92 mg/dL).  Assessment: JAPHETH DIEKMAN a 83 y.o. male presented with dysphagia- on warfarin PTA for AF and held for EGD (LD 09/06/23). INR 1.8 on admit. Pharmacy consulted to resume warfarin (PTA dosing 5 mg daily without a dose on Saturdays). Per MD note will continue heparin  until INR therapeutic.   Heparin  supratherapeutic this AM at >1.1 with heparin  running at 1300 units/hour prior to being held for EGD. Will plan to resume heparin  at  a lower rate without bolus. Noted DDI with warfarin and simvastatin , though both are PTA meds. As he has only missed a couple of warfarin doses in past couple of days, will give an augmented dose in an attempt to approach total weekly dose of warfarin. CBC WNL.   Goal of Therapy:  INR 2-3 Heparin  level 0.3-0.7 units/ml Monitor platelets by anticoagulation protocol: Yes   Plan:  Resume heparin  infusion at 1,100 units/hour  Warfarin 10 mg  Heparin  level in 8 hours CBC/INR/HL daily   Massie Fila, PharmD Clinical Pharmacist  09/09/2023 1:14 PM

## 2023-09-09 NOTE — Anesthesia Postprocedure Evaluation (Signed)
 Anesthesia Post Note  Patient: Eric Gallagher  Procedure(s) Performed: EGD (ESOPHAGOGASTRODUODENOSCOPY)     Patient location during evaluation: Endoscopy Anesthesia Type: MAC Level of consciousness: awake and alert Pain management: pain level controlled Vital Signs Assessment: post-procedure vital signs reviewed and stable Respiratory status: spontaneous breathing, nonlabored ventilation, respiratory function stable and patient connected to nasal cannula oxygen Cardiovascular status: stable and blood pressure returned to baseline Postop Assessment: no apparent nausea or vomiting Anesthetic complications: no   No notable events documented.  Last Vitals:  Vitals:   09/09/23 1000 09/09/23 1010  BP: (!) 134/92 (!) 141/91  Pulse: 72 69  Resp: 20 19  Temp:    SpO2: 95% 96%    Last Pain:  Vitals:   09/09/23 1010  TempSrc:   PainSc: 0-No pain                 Rome Ade

## 2023-09-09 NOTE — Plan of Care (Signed)

## 2023-09-09 NOTE — Plan of Care (Signed)

## 2023-09-09 NOTE — Interval H&P Note (Signed)
 History and Physical Interval Note:  09/09/2023 8:59 AM  [name]  has presented today for surgery, with the diagnosis of Dysphagia, odynophagia.  The various methods of treatment have been discussed with the patient and family. After consideration of risks, benefits and other options for treatment, the patient has consented to  Procedure(s): EGD (ESOPHAGOGASTRODUODENOSCOPY) (N/A) as a surgical intervention.  The patient's history has been reviewed, patient examined, no change in status, stable for surgery.  I have reviewed the patient's chart and labs.  Questions were answered to the patient's satisfaction.     [name]

## 2023-09-10 DIAGNOSIS — R1310 Dysphagia, unspecified: Secondary | ICD-10-CM | POA: Diagnosis not present

## 2023-09-10 LAB — CBC
HCT: 47.6 % (ref 39.0–52.0)
Hemoglobin: 15.6 g/dL (ref 13.0–17.0)
MCH: 31 pg (ref 26.0–34.0)
MCHC: 32.8 g/dL (ref 30.0–36.0)
MCV: 94.6 fL (ref 80.0–100.0)
Platelets: 123 K/uL — ABNORMAL LOW (ref 150–400)
RBC: 5.03 MIL/uL (ref 4.22–5.81)
RDW: 13.4 % (ref 11.5–15.5)
WBC: 6.3 K/uL (ref 4.0–10.5)
nRBC: 0 % (ref 0.0–0.2)

## 2023-09-10 LAB — GLUCOSE, CAPILLARY
Glucose-Capillary: 190 mg/dL — ABNORMAL HIGH (ref 70–99)
Glucose-Capillary: 142 mg/dL — ABNORMAL HIGH (ref 70–99)
Glucose-Capillary: 182 mg/dL — ABNORMAL HIGH (ref 70–99)
Glucose-Capillary: 133 mg/dL — ABNORMAL HIGH (ref 70–99)

## 2023-09-10 LAB — PROTIME-INR
INR: 1.6 — ABNORMAL HIGH (ref 0.8–1.2)
INR: 1.6 — ABNORMAL HIGH (ref 0.8–1.2)
Prothrombin Time: 19.6 s — ABNORMAL HIGH (ref 11.4–15.2)
Prothrombin Time: 20 s — ABNORMAL HIGH (ref 11.4–15.2)

## 2023-09-10 LAB — HEPARIN LEVEL (UNFRACTIONATED)
Heparin Unfractionated: 0.87 [IU]/mL — ABNORMAL HIGH (ref 0.30–0.70)
Heparin Unfractionated: 0.7 [IU]/mL (ref 0.30–0.70)

## 2023-09-10 MED ORDER — WARFARIN SODIUM 5 MG PO TABS
5.0000 mg | ORAL_TABLET | Freq: Once | ORAL | Status: AC
  Administered 2023-09-10: 5 mg via ORAL
  Filled 2023-09-10: qty 1

## 2023-09-10 NOTE — Plan of Care (Signed)

## 2023-09-10 NOTE — Progress Notes (Signed)
 PROGRESS NOTE Eric Gallagher  FMW:[id number] DOB: 1940-09-12 DOA: 09/07/2023 PCP: Ransom Other, MD  Brief Narrative/Hospital Course: 83 year old male with complex medical history including HTN, HLD, CAD, CVA, AF on Coumadin  presented with dysphagia.  Patient has previous history of esophagitis and gastritis last year.  In the ED hemodynamically stable, labs fairly unremarkable INR 1.8.  Placed on heparin  drip Gastroenterology consulted and admitted. Underwent EGD found to have ulcerative esophagitis, gastritis> heparin  Coumadin  resumed per postprocedure  Subjective: Patient seen and examined Alert awake resting comfortably Overnight afebrile BP stable  Remains on Coumadin /heparin  due to subtherapeutic INR   Assessment and plan:  Dysphagia Odynophagia History of LA grade D esophagitis and diffuse gastritis: Appreciate GI input.  s/p EGD 7/31-ulcerative esophagitis seen, biopsied. Gastritis as well. Continue PPI BID x 2 months, sucralfate  QID x 14 days, viscous lidocaine  TID PRN for 7 days. Per gi Ok to resume heparin /coumadin   Chronic A-fib on Coumadin  Episode of bradycardia 7/29: INR subtherapeutic-patient on heparin  and Coumadin > pharmacy managing once INR close to therapeutic/therapeutic plan to discharge Recent Labs  Lab 09/07/23 0839 09/09/23 1249 09/10/23 0217  INR 1.8* 1.5* 1.6*    T2DM: Well-controlled, holding metformin  and glipizide  continue SSI Recent Labs  Lab 09/07/23 0838 09/07/23 1556 09/09/23 0826 09/09/23 1040 09/09/23 1636 09/09/23 2142 09/10/23 0821  GLUCAP  --    < > 151* 126* 151* 158* 190*  HGBA1C 7.5*  --   --   --   --   --   --    < > = values in this interval not displayed.    Hypertension: BP controlled.On metoprolol  and benazepril  continue as able  History of CVA CAD HLD: No chest pain. Continue statin and coumadin .  Thrombocytopenia, chronic: Mildly low. monitor  BPH: Continue finasteride    DVT prophylaxis: Heparin  Code  Status:   Code Status: Full Code Family Communication: plan of care discussed with patient and son at bedside. Patient status is: Remains hospitalized because of severity of illness Level of care: Telemetry Medical   Dispo: The patient is from: HOME            Anticipated disposition: Anticipating home 1-2 day once INR therapeutic   objective: Vitals last 24 hrs: Vitals:   09/09/23 2146 09/10/23 0041 09/10/23 0548 09/10/23 0821  BP: (!) 153/95 (!) 137/94 (!) 144/87 (!) 139/90  Pulse: 75 66 70 93  Resp: 17 17 18 19   Temp: 97.7 F (36.5 C) 97.9 F (36.6 C) 97.8 F (36.6 C) 98 F (36.7 C)  TempSrc: Oral Oral Oral Oral  SpO2: 98% 99% 99% 98%  Weight:      Height:        Physical Examination: General exam: Alert oriented pleasant  HEENT:Oral mucosa moist, Ear/Nose WNL grossly Respiratory system: Bilaterally clear BS,no use of accessory muscle Cardiovascular system: S1 & S2 +, No JVD. Gastrointestinal system: Abdomen soft,NT,ND, BS+ Nervous System: Alert, awake, moving all extremities,and following commands. Extremities: LE edema neg, distal extremities warm.  Skin: No rashes,no icterus. MSK: Normal muscle bulk,tone, power     Data Reviewed: I have personally reviewed following labs and imaging studies ( see epic result tab) CBC: Recent Labs  Lab 09/07/23 0839 09/08/23 0227 09/09/23 0202 09/10/23 0217  WBC 5.7 5.7 5.5 6.3  NEUTROABS 4.2  --   --   --   HGB 15.6 15.5 15.8 15.6  HCT 47.4 47.0 48.2 47.6  MCV 95.0 94.6 94.9 94.6  PLT 119* 122* 134* 123*   CMP:  Recent Labs  Lab 09/07/23 0839 09/08/23 0227  NA 136 139  K 4.3 4.3  CL 102 104  CO2 27 28  GLUCOSE 173* 116*  BUN 15 10  CREATININE 0.97 0.92  CALCIUM 9.3 9.3   GFR: Estimated Creatinine Clearance: 69.5 mL/min (by C-G formula based on SCr of 0.92 mg/dL). Recent Labs  Lab 09/07/23 0839  AST 19  ALT 16  ALKPHOS 49  BILITOT 0.8  PROT 6.8  ALBUMIN  3.7    Recent Labs  Lab 09/07/23 0839  LIPASE 35    No results for input(s): AMMONIA in the last 168 hours. Coagulation Profile:  Recent Labs  Lab 09/07/23 0839 09/09/23 1249 09/10/23 0217  INR 1.8* 1.5* 1.6*   Unresulted Labs (From admission, onward)     Start     Ordered   09/11/23 0500  Protime-INR  Daily,   R      09/10/23 0724   09/10/23 1200  Heparin  level (unfractionated)  Once-Timed,   TIMED        09/10/23 0359   09/09/23 0500  Heparin  level (unfractionated)  Daily,   R      09/07/23 1752   09/08/23 0500  CBC  Daily,   R      09/07/23 1752           Antimicrobials/Microbiology: Anti-infectives (From admission, onward)    None      No results found for: SDES, SPECREQUEST, CULT, REPTSTATUS  Procedures: Procedure(s) (LRB): EGD (ESOPHAGOGASTRODUODENOSCOPY) (N/A) Medications reviewed:  Scheduled Meds:  benazepril   20 mg Oral QHS   finasteride   5 mg Oral QHS   insulin  aspart  0-5 Units Subcutaneous QHS   insulin  aspart  0-9 Units Subcutaneous TID WC   metoprolol  tartrate  25 mg Oral QHS   pantoprazole   40 mg Oral BID   simvastatin   40 mg Oral QPM   sucralfate   1 g Oral TID WC & HS   Warfarin - Pharmacist Dosing Inpatient   Does not apply q1600   Continuous Infusions:  heparin  950 Units/hr (09/10/23 0400)   Mennie LAMY, MD [facility] 09/10/2023, 10:20 AM

## 2023-09-10 NOTE — Progress Notes (Signed)
 Pharmacy Consult for warfarin/heparin .  Indication: atrial fibrillation  Allergies  Allergen Reactions   Flomax [Tamsulosin Hcl] Other (See Comments)    Dizzy    Patient Measurements: Height: 6' 2 (188 cm) Weight: 88.5 kg (195 lb) IBW/kg (Calculated) : 82.2 HEPARIN  DW (KG): 88.5  Vital Signs: Temp: 98.2 F (36.8 C) (08/01 1234) Temp Source: Oral (08/01 0821) BP: 151/99 (08/01 1234) Pulse Rate: 86 (08/01 1234)  Labs: Recent Labs    09/08/23 0227 09/09/23 0202 09/09/23 1249 09/09/23 2142 09/10/23 0217 09/10/23 1156  HGB 15.5 15.8  --   --  15.6  --   HCT 47.0 48.2  --   --  47.6  --   PLT 122* 134*  --   --  123*  --   LABPROT  --   --  18.9*  --  19.6*  --   INR  --   --  1.5*  --  1.6*  --   HEPARINUNFRC 0.61 >1.10*  --  0.51 0.87* 0.70  CREATININE 0.92  --   --   --   --   --     Estimated Creatinine Clearance: 69.5 mL/min (by C-G formula based on SCr of 0.92 mg/dL).  Assessment: Eric Gallagher a 83 y.o. male presented with dysphagia- on warfarin PTA for AF and held for EGD (LD 09/06/23). INR 1.8 on admit. Pharmacy consulted to resume warfarin (PTA dosing 5 mg daily without a dose on Saturdays). Per MD note will continue heparin  until INR therapeutic.   8/1 AM: Heparin  therapeutic at 0.7 with heparin  running at 950 units/hour. CBC WNL. INR subtherapeutic at 1.6 s/p augmented 10 mg dose. Probably will take a couple of days to be therapeutic.   Goal of Therapy:  INR 2-3 Heparin  level 0.3-0.7 units/ml Monitor platelets by anticoagulation protocol: Yes   Plan:  Continue heparin  at 950 units/hour  Warfarin 5 mg per PTA regimen  CBC/INR/HL daily   Massie Fila, PharmD Clinical Pharmacist  09/10/2023 1:37 PM

## 2023-09-10 NOTE — Progress Notes (Signed)
 Pharmacy Consult for warfarin/heparin .  Indication: atrial fibrillation  Allergies  Allergen Reactions   Flomax [Tamsulosin Hcl] Other (See Comments)    Dizzy    Patient Measurements: Height: 6' 2 (188 cm) Weight: 88.5 kg (195 lb) IBW/kg (Calculated) : 82.2 HEPARIN  DW (KG): 88.5  Vital Signs: Temp: 97.9 F (36.6 C) (08/01 0041) Temp Source: Oral (08/01 0041) BP: 137/94 (08/01 0041) Pulse Rate: 66 (08/01 0041)  Labs: Recent Labs    09/07/23 0839 09/07/23 0839 09/08/23 0227 09/09/23 0202 09/09/23 1249 09/09/23 2142 09/10/23 0217  HGB 15.6  --  15.5 15.8  --   --  15.6  HCT 47.4  --  47.0 48.2  --   --  47.6  PLT 119*  --  122* 134*  --   --  123*  LABPROT 21.8*  --   --   --  18.9*  --   --   INR 1.8*  --   --   --  1.5*  --   --   HEPARINUNFRC  --    < > 0.61 >1.10*  --  0.51 0.87*  CREATININE 0.97  --  0.92  --   --   --   --    < > = values in this interval not displayed.    Estimated Creatinine Clearance: 69.5 mL/min (by C-G formula based on SCr of 0.92 mg/dL).  Assessment: Eric Gallagher a 83 y.o. male presented with dysphagia- on warfarin PTA for AF and held for EGD (LD 09/06/23). INR 1.8 on admit. Pharmacy consulted to resume warfarin (PTA dosing 5 mg daily without a dose on Saturdays). Per MD note will continue heparin  until INR therapeutic.   -heparin  level 0.87 above goal on 1100 units/hr. Per RN, drawn appropriately no signs/symptoms of bleeding.  Goal of Therapy:  INR 2-3 Heparin  level 0.3-0.7 units/ml Monitor platelets by anticoagulation protocol: Yes   Plan:  Decrease heparin  infusion at 950 units/hour  8h heparin  level CBC/INR/Heparin  level daily   Lynwood Poplar, PharmD, BCPS Clinical Pharmacist 09/10/2023 3:56 AM

## 2023-09-11 DIAGNOSIS — R1310 Dysphagia, unspecified: Secondary | ICD-10-CM | POA: Diagnosis not present

## 2023-09-11 LAB — CBC
HCT: 48.9 % (ref 39.0–52.0)
Hemoglobin: 16 g/dL (ref 13.0–17.0)
MCH: 31 pg (ref 26.0–34.0)
MCHC: 32.7 g/dL (ref 30.0–36.0)
MCV: 94.8 fL (ref 80.0–100.0)
Platelets: 131 K/uL — ABNORMAL LOW (ref 150–400)
RBC: 5.16 MIL/uL (ref 4.22–5.81)
RDW: 13.3 % (ref 11.5–15.5)
WBC: 6.7 K/uL (ref 4.0–10.5)
nRBC: 0 % (ref 0.0–0.2)

## 2023-09-11 LAB — GLUCOSE, CAPILLARY
Glucose-Capillary: 196 mg/dL — ABNORMAL HIGH (ref 70–99)
Glucose-Capillary: 161 mg/dL — ABNORMAL HIGH (ref 70–99)

## 2023-09-11 LAB — PROTIME-INR
INR: 1.9 — ABNORMAL HIGH (ref 0.8–1.2)
Prothrombin Time: 22.9 s — ABNORMAL HIGH (ref 11.4–15.2)

## 2023-09-11 LAB — HEPARIN LEVEL (UNFRACTIONATED): Heparin Unfractionated: 0.91 [IU]/mL — ABNORMAL HIGH (ref 0.30–0.70)

## 2023-09-11 NOTE — Progress Notes (Signed)
 Pharmacy Consult for warfarin/heparin .  Indication: atrial fibrillation  Allergies  Allergen Reactions   Flomax [Tamsulosin Hcl] Other (See Comments)    Dizzy    Patient Measurements: Height: 6' 2 (188 cm) Weight: 88.5 kg (195 lb) IBW/kg (Calculated) : 82.2 HEPARIN  DW (KG): 88.5  Vital Signs: Temp: 97.6 F (36.4 C) (08/01 1954) BP: 154/96 (08/01 1954) Pulse Rate: 86 (08/01 1954)  Labs: Recent Labs    09/09/23 0202 09/09/23 1249 09/10/23 0217 09/10/23 1156 09/10/23 1550 09/11/23 0234  HGB 15.8  --  15.6  --   --  16.0  HCT 48.2  --  47.6  --   --  48.9  PLT 134*  --  123*  --   --  131*  LABPROT  --    < > 19.6*  --  20.0* 22.9*  INR  --    < > 1.6*  --  1.6* 1.9*  HEPARINUNFRC >1.10*   < > 0.87* 0.70  --  0.91*   < > = values in this interval not displayed.    Estimated Creatinine Clearance: 69.5 mL/min (by C-G formula based on SCr of 0.92 mg/dL).  Assessment: Eric Gallagher a 83 y.o. male presented with dysphagia- on warfarin PTA for AF and held for EGD (LD 09/06/23). INR 1.8 on admit. Pharmacy consulted to resume warfarin (PTA dosing 5 mg daily without a dose on Saturdays). Per MD note will continue heparin  until INR therapeutic.   8/2 AM: Heparin  supra-therapeutic at 0.91 with heparin  running at 950 units/hour. CBC WNL. Per RN, level drawn appropriately and no signs/symptoms of bleeding.  Goal of Therapy:  INR 2-3 Heparin  level 0.3-0.7 units/ml Monitor platelets by anticoagulation protocol: Yes   Plan:  Decrease heparin  to 800 units/hour  8h heparin  level CBC/INR/HL daily   Lynwood Poplar, PharmD, BCPS Clinical Pharmacist 09/11/2023 4:27 AM

## 2023-09-11 NOTE — Discharge Summary (Signed)
 Physician Discharge Summary   Patient: Eric Gallagher MRN: [id number] DOB: December 08, 1940  Admit date:     09/07/2023  Discharge date: 09/11/23  Discharge Physician: MDALA-GAUSI, GOLDEN PILLOW   PCP: Husain, Karrar, MD   Recommendations at discharge:   Follow-up with PCP and with gastroenterology.  Discharge Diagnoses: Principal Problem:   Dysphagia Active Problems:   History of esophagitis   Permanent atrial fibrillation (HCC)   Subtherapeutic international normalized ratio (INR)   Type 2 diabetes mellitus without complications (HCC)   HTN (hypertension)   CAD (coronary artery disease)   Hyperlipidemia   History of CVA (cerebrovascular accident)   Thrombocytopenia (HCC)   Benign prostatic hyperplasia  Resolved Problems:   * No resolved hospital problems. *  Hospital Course: 83 year old male with complex medical history including HTN, HLD, CAD, CVA, AF on Coumadin  who presented with dysphagia.  Patient has previous history of esophagitis and gastritis last year.  In the ED hemodynamically stable, labs fairly unremarkable INR 1.8.  Placed on heparin  drip Gastroenterology consulted and admitted. Underwent EGD found to have ulcerative esophagitis, gastritis.  He was started on Protonix , Carafate , viscous lidocaine .  Coumadin  with heparin  bridge resumed per postprocedure.  The rest of the hospital course is in problem-based format below:    Assessment and Plan:  Dysphagia Odynophagia History of LA grade D esophagitis and diffuse gastritis: Gastroenterology was consulted. Patient underwent EGD on 7/31.  Found to have ulcerative esophagitis and gastritis.  Biopsies were taken.   Patient was started on PPI twice daily, sucralfate  4 times daily for 14 days, viscous lidocaine  3 times daily as needed for 7 days. He is to follow-up with gastroenterology as outpatient.   Chronic A-fib on Coumadin  Episode of bradycardia 7/29: INR was 1.8 on presentation. Coumadin  was held for  procedure and resumed postprocedure with a heparin  bridge. Of note, CHA2DS2-VASc score is at least 6.  On the day of discharge, the patient's INR was 1.9.  INR had been 1.6 the previous day.  The patient stated that his PCP has told him that an INR of 1.9 is acceptable for him.  I did explain to the patient and his son at bedside that the usual goal is 2-3 and that he would ideally be staying in hospital for another 2 days, assuming he became therapeutic tomorrow (with an additional day to ensure that he remains therapeutic).  However, the patient expressed a strong desire to go home and take care of his ailing wife.  Given that his INR was close to therapeutic, and at his goal per his PCP, I agreed to let him go home and emphasized the need for early follow-up with INR monitoring.   T2DM: A1c was 7.5. Home metformin  was resumed at discharge.   Hypertension: BP controlled. Home metoprolol  and benazepril  were continued.   History of CVA CAD HLD: No chest pain.  Home simvastatin  was continued.     BPH: Continued home finasteride     Consultants: Gastroenterology Procedures performed: EGD Disposition: Home Diet recommendation:  Discharge Diet Orders (From admission, onward)     Start     Ordered   09/11/23 0000  Diet - low sodium heart healthy        09/11/23 1126           Cardiac and Carb modified diet DISCHARGE MEDICATION: Allergies as of 09/11/2023       Reactions   Flomax [tamsulosin Hcl] Other (See Comments)   Dizzy  Medication List     STOP taking these medications    gabapentin  100 MG capsule Commonly known as: NEURONTIN    nitroGLYCERIN  0.4 MG SL tablet Commonly known as: NITROSTAT        TAKE these medications    acetaminophen  650 MG CR tablet Commonly known as: TYLENOL  Take 1,300 mg by mouth daily.   benazepril  20 MG tablet Commonly known as: LOTENSIN  Take 20 mg by mouth at bedtime.   ciclopirox  8 % solution Commonly known as:  PENLAC  Apply topically at bedtime. Apply thin layer over nail. Apply daily over previous coat. Remove weekly with polish remover.   finasteride  5 MG tablet Commonly known as: PROSCAR  Take 5 mg by mouth at bedtime.   lidocaine  2 % solution Commonly known as: XYLOCAINE  Use as directed 15 mLs in the mouth or throat 3 (three) times daily as needed for up to 7 days (sore throat).   metFORMIN  1000 MG tablet Commonly known as: GLUCOPHAGE  Take 1,000 mg by mouth at bedtime.   metoprolol  tartrate 50 MG tablet Commonly known as: LOPRESSOR  Take 1 tablet (50 mg total) by mouth 2 (two) times daily. What changed: when to take this   pantoprazole  40 MG tablet Commonly known as: PROTONIX  Take 1 tablet (40 mg total) by mouth 2 (two) times daily.   simvastatin  40 MG tablet Commonly known as: ZOCOR  Take 40 mg by mouth every evening.   sucralfate  1 GM/10ML suspension Commonly known as: CARAFATE  Take 10 mLs (1 g total) by mouth 4 (four) times daily -  with meals and at bedtime for 14 days.   warfarin 5 MG tablet Commonly known as: COUMADIN  Take 1 tablet (5 mg total) by mouth See admin instructions. Take 5 mg by mouth daily except for Saturdays ---Restart Coumadin  on Wednesday 06/15/2018 What changed: additional instructions        Follow-up Information     Ransom Other, MD Follow up in 1 week(s).   Specialty: Internal Medicine Contact information: [address] [place] [address] [phone]                Discharge Exam: Eric Gallagher   09/07/23 0713  Weight: 88.5 kg   Physical Exam   General: Alert, oriented X3  Eyes: Pupils equal, reactive  Oral cavity: moist mucous membranes  Head: Atraumatic, normocephalic  Neck: supple  Chest: clear to auscultation. No crackles, no wheezes  CVS: S1, S2 Abd: No distention, soft, non-tender. No masses palpable  Extr: No edema   MSK: No joint deformities or swelling  Neurological: Grossly intact.     Condition at discharge: stable  The results of significant diagnostics from this hospitalization (including imaging, microbiology, ancillary and laboratory) are listed below for reference.   Imaging Studies: DG Chest Portable 1 View Result Date: 09/07/2023 CLINICAL DATA:  Pain. EXAM: PORTABLE CHEST 1 VIEW COMPARISON:  12/18/2022. FINDINGS: Bilateral lung fields are clear. Bilateral costophrenic angles are clear. Note is made of elevated left hemidiaphragm. Normal cardio-mediastinal silhouette. No acute osseous abnormalities. The soft tissues are within normal limits. IMPRESSION: No active disease. Electronically Signed   By: Ree Molt M.D.   On: 09/07/2023 08:54    Microbiology: Results for orders placed or performed during the hospital encounter of 02/23/18  Surgical pcr screen     Status: None   Collection Time: 02/23/18 11:15 AM   Specimen: Nasal Mucosa; Nasal Swab  Result Value Ref Range Status   MRSA, PCR NEGATIVE NEGATIVE Final   Staphylococcus  aureus NEGATIVE NEGATIVE Final    Comment: (NOTE) The Xpert SA Assay (FDA approved for NASAL specimens in patients 73 years of age and older), is one component of a comprehensive surveillance program. It is not intended to diagnose infection nor to guide or monitor treatment. Performed at [facility], [address], [place], [place] [address]     Labs: CBC: Recent Labs  Lab 09/07/23 [phone] 09/08/23 0227 09/09/23 0202 09/10/23 0217 09/11/23 0234  WBC 5.7 5.7 5.5 6.3 6.7  NEUTROABS 4.2  --   --   --   --   HGB 15.6 15.5 15.8 15.6 16.0  HCT 47.4 47.0 48.2 47.6 48.9  MCV 95.0 94.6 94.9 94.6 94.8  PLT 119* 122* 134* 123* 131*   Basic Metabolic Panel: Recent Labs  Lab 09/07/23 0839 09/08/23 0227  NA 136 139  K 4.3 4.3  CL 102 104  CO2 27 28  GLUCOSE 173* 116*  BUN 15 10  CREATININE 0.97 0.92  CALCIUM 9.3 9.3   Liver Function Tests: Recent Labs  Lab 09/07/23 0839  AST 19  ALT 16  ALKPHOS 49  BILITOT  0.8  PROT 6.8  ALBUMIN  3.7   CBG: Recent Labs  Lab 09/10/23 1232 09/10/23 1614 09/10/23 2102 09/11/23 0548 09/11/23 0827  GLUCAP 142* 182* 133* 196* 161*    Discharge time spent: greater than 30 minutes.  Signed: MDALA-GAUSI, Myreon Wimer AGATHA, MD [facility] 09/11/2023

## 2023-09-11 NOTE — Progress Notes (Signed)
 AVS and education reviewed with patient and son.  Both verbally understand plan of care. PIV removed dressing intact.  Transfer to MAIN entrance .

## 2023-09-12 ENCOUNTER — Encounter (HOSPITAL_COMMUNITY): Payer: Self-pay | Admitting: Internal Medicine

## 2023-09-14 DIAGNOSIS — K2210 Ulcer of esophagus without bleeding: Secondary | ICD-10-CM | POA: Diagnosis not present

## 2023-09-14 DIAGNOSIS — I69352 Hemiplegia and hemiparesis following cerebral infarction affecting left dominant side: Secondary | ICD-10-CM | POA: Diagnosis not present

## 2023-09-14 DIAGNOSIS — E782 Mixed hyperlipidemia: Secondary | ICD-10-CM | POA: Diagnosis not present

## 2023-09-14 DIAGNOSIS — I4821 Permanent atrial fibrillation: Secondary | ICD-10-CM | POA: Diagnosis not present

## 2023-09-14 DIAGNOSIS — I25119 Atherosclerotic heart disease of native coronary artery with unspecified angina pectoris: Secondary | ICD-10-CM | POA: Diagnosis not present

## 2023-09-14 DIAGNOSIS — E1140 Type 2 diabetes mellitus with diabetic neuropathy, unspecified: Secondary | ICD-10-CM | POA: Diagnosis not present

## 2023-09-14 DIAGNOSIS — I10 Essential (primary) hypertension: Secondary | ICD-10-CM | POA: Diagnosis not present

## 2023-09-14 DIAGNOSIS — Z7901 Long term (current) use of anticoagulants: Secondary | ICD-10-CM | POA: Diagnosis not present

## 2023-09-14 DIAGNOSIS — R269 Unspecified abnormalities of gait and mobility: Secondary | ICD-10-CM | POA: Diagnosis not present

## 2023-09-14 DIAGNOSIS — Z0000 Encounter for general adult medical examination without abnormal findings: Secondary | ICD-10-CM | POA: Diagnosis not present

## 2023-09-14 DIAGNOSIS — D6869 Other thrombophilia: Secondary | ICD-10-CM | POA: Diagnosis not present

## 2023-09-14 DIAGNOSIS — D696 Thrombocytopenia, unspecified: Secondary | ICD-10-CM | POA: Diagnosis not present

## 2023-09-16 LAB — SURGICAL PATHOLOGY

## 2023-10-04 DIAGNOSIS — Z7901 Long term (current) use of anticoagulants: Secondary | ICD-10-CM | POA: Diagnosis not present

## 2023-10-04 DIAGNOSIS — E538 Deficiency of other specified B group vitamins: Secondary | ICD-10-CM | POA: Diagnosis not present

## 2023-10-10 DIAGNOSIS — E782 Mixed hyperlipidemia: Secondary | ICD-10-CM | POA: Diagnosis not present

## 2023-10-10 DIAGNOSIS — I25119 Atherosclerotic heart disease of native coronary artery with unspecified angina pectoris: Secondary | ICD-10-CM | POA: Diagnosis not present

## 2023-10-10 DIAGNOSIS — I10 Essential (primary) hypertension: Secondary | ICD-10-CM | POA: Diagnosis not present

## 2023-10-15 DIAGNOSIS — K2100 Gastro-esophageal reflux disease with esophagitis, without bleeding: Secondary | ICD-10-CM | POA: Diagnosis not present

## 2023-10-15 DIAGNOSIS — R1310 Dysphagia, unspecified: Secondary | ICD-10-CM | POA: Diagnosis not present

## 2023-10-15 DIAGNOSIS — R1013 Epigastric pain: Secondary | ICD-10-CM | POA: Diagnosis not present

## 2023-10-18 DIAGNOSIS — K209 Esophagitis, unspecified without bleeding: Secondary | ICD-10-CM | POA: Diagnosis not present

## 2023-10-18 DIAGNOSIS — I4821 Permanent atrial fibrillation: Secondary | ICD-10-CM | POA: Diagnosis not present

## 2023-10-27 DIAGNOSIS — Z7984 Long term (current) use of oral hypoglycemic drugs: Secondary | ICD-10-CM | POA: Diagnosis not present

## 2023-10-27 DIAGNOSIS — H3509 Other intraretinal microvascular abnormalities: Secondary | ICD-10-CM | POA: Diagnosis not present

## 2023-10-27 DIAGNOSIS — E119 Type 2 diabetes mellitus without complications: Secondary | ICD-10-CM | POA: Diagnosis not present

## 2023-10-27 DIAGNOSIS — H3581 Retinal edema: Secondary | ICD-10-CM | POA: Diagnosis not present

## 2023-10-27 DIAGNOSIS — H35372 Puckering of macula, left eye: Secondary | ICD-10-CM | POA: Diagnosis not present

## 2023-11-03 DIAGNOSIS — E133293 Other specified diabetes mellitus with mild nonproliferative diabetic retinopathy without macular edema, bilateral: Secondary | ICD-10-CM | POA: Diagnosis not present

## 2023-11-03 DIAGNOSIS — H43812 Vitreous degeneration, left eye: Secondary | ICD-10-CM | POA: Diagnosis not present

## 2023-11-03 DIAGNOSIS — H34832 Tributary (branch) retinal vein occlusion, left eye, with macular edema: Secondary | ICD-10-CM | POA: Diagnosis not present

## 2023-11-03 DIAGNOSIS — H43821 Vitreomacular adhesion, right eye: Secondary | ICD-10-CM | POA: Diagnosis not present

## 2023-11-04 DIAGNOSIS — I10 Essential (primary) hypertension: Secondary | ICD-10-CM | POA: Diagnosis not present

## 2023-11-04 DIAGNOSIS — Z7901 Long term (current) use of anticoagulants: Secondary | ICD-10-CM | POA: Diagnosis not present

## 2023-11-04 DIAGNOSIS — E538 Deficiency of other specified B group vitamins: Secondary | ICD-10-CM | POA: Diagnosis not present

## 2023-11-04 DIAGNOSIS — Z23 Encounter for immunization: Secondary | ICD-10-CM | POA: Diagnosis not present

## 2023-11-04 DIAGNOSIS — E1140 Type 2 diabetes mellitus with diabetic neuropathy, unspecified: Secondary | ICD-10-CM | POA: Diagnosis not present

## 2023-11-09 DIAGNOSIS — I10 Essential (primary) hypertension: Secondary | ICD-10-CM | POA: Diagnosis not present

## 2023-11-09 DIAGNOSIS — I25119 Atherosclerotic heart disease of native coronary artery with unspecified angina pectoris: Secondary | ICD-10-CM | POA: Diagnosis not present

## 2023-11-09 DIAGNOSIS — K2970 Gastritis, unspecified, without bleeding: Secondary | ICD-10-CM | POA: Diagnosis not present

## 2023-11-09 DIAGNOSIS — K2289 Other specified disease of esophagus: Secondary | ICD-10-CM | POA: Diagnosis not present

## 2023-11-09 DIAGNOSIS — R1310 Dysphagia, unspecified: Secondary | ICD-10-CM | POA: Diagnosis not present

## 2023-11-09 DIAGNOSIS — E782 Mixed hyperlipidemia: Secondary | ICD-10-CM | POA: Diagnosis not present

## 2023-11-09 DIAGNOSIS — K219 Gastro-esophageal reflux disease without esophagitis: Secondary | ICD-10-CM | POA: Diagnosis not present

## 2023-11-09 DIAGNOSIS — K2210 Ulcer of esophagus without bleeding: Secondary | ICD-10-CM | POA: Diagnosis not present

## 2023-11-12 DIAGNOSIS — Z7901 Long term (current) use of anticoagulants: Secondary | ICD-10-CM | POA: Diagnosis not present

## 2023-11-15 DIAGNOSIS — Z7901 Long term (current) use of anticoagulants: Secondary | ICD-10-CM | POA: Diagnosis not present

## 2023-11-17 DIAGNOSIS — H34832 Tributary (branch) retinal vein occlusion, left eye, with macular edema: Secondary | ICD-10-CM | POA: Diagnosis not present

## 2023-11-17 DIAGNOSIS — H43821 Vitreomacular adhesion, right eye: Secondary | ICD-10-CM | POA: Diagnosis not present

## 2023-11-17 DIAGNOSIS — H43812 Vitreous degeneration, left eye: Secondary | ICD-10-CM | POA: Diagnosis not present

## 2023-11-17 DIAGNOSIS — E133293 Other specified diabetes mellitus with mild nonproliferative diabetic retinopathy without macular edema, bilateral: Secondary | ICD-10-CM | POA: Diagnosis not present

## 2023-11-19 ENCOUNTER — Ambulatory Visit: Admitting: Podiatry

## 2023-11-26 ENCOUNTER — Ambulatory Visit: Admitting: Podiatry
